# Patient Record
Sex: Male | Born: 1970 | Race: White | Hispanic: No | Marital: Single | Smoking: Never smoker
Health system: Southern US, Community
[De-identification: ages and names within clinical notes are randomized; demographics above are authoritative.]

## PROBLEM LIST (undated history)

## (undated) ENCOUNTER — Emergency Department (HOSPITAL_COMMUNITY): Payer: Self-pay | Source: Home / Self Care

## (undated) DIAGNOSIS — E669 Obesity, unspecified: Secondary | ICD-10-CM

## (undated) DIAGNOSIS — F419 Anxiety disorder, unspecified: Secondary | ICD-10-CM

## (undated) DIAGNOSIS — F319 Bipolar disorder, unspecified: Secondary | ICD-10-CM

## (undated) DIAGNOSIS — M1990 Unspecified osteoarthritis, unspecified site: Secondary | ICD-10-CM

## (undated) DIAGNOSIS — F4310 Post-traumatic stress disorder, unspecified: Secondary | ICD-10-CM

## (undated) DIAGNOSIS — Z8739 Personal history of other diseases of the musculoskeletal system and connective tissue: Secondary | ICD-10-CM

## (undated) DIAGNOSIS — M797 Fibromyalgia: Secondary | ICD-10-CM

## (undated) HISTORY — PX: GASTRIC BYPASS: SHX52

## (undated) HISTORY — PX: ADENOIDECTOMY: SUR15

## (undated) HISTORY — PX: APPENDECTOMY: SHX54

## (undated) HISTORY — PX: TYMPANOSTOMY TUBE PLACEMENT: SHX32

## (undated) HISTORY — PX: CHOLECYSTECTOMY: SHX55

---

## 2000-07-20 ENCOUNTER — Encounter: Payer: Self-pay | Admitting: *Deleted

## 2000-07-20 ENCOUNTER — Emergency Department (HOSPITAL_COMMUNITY): Admission: EM | Admit: 2000-07-20 | Discharge: 2000-07-20 | Payer: Self-pay | Admitting: *Deleted

## 2004-03-20 ENCOUNTER — Ambulatory Visit (HOSPITAL_COMMUNITY): Admission: RE | Admit: 2004-03-20 | Discharge: 2004-03-20 | Payer: Self-pay | Admitting: Chiropractic Medicine

## 2009-08-31 ENCOUNTER — Emergency Department (HOSPITAL_COMMUNITY): Admission: EM | Admit: 2009-08-31 | Discharge: 2009-08-31 | Payer: Self-pay | Admitting: Emergency Medicine

## 2010-03-19 LAB — DIFFERENTIAL
Basophils Absolute: 0 10*3/uL (ref 0.0–0.1)
Basophils Relative: 0 % (ref 0–1)
Eosinophils Absolute: 0.1 10*3/uL (ref 0.0–0.7)
Eosinophils Relative: 1 % (ref 0–5)
Lymphocytes Relative: 22 % (ref 12–46)
Lymphs Abs: 1.1 K/uL (ref 0.7–4.0)
Monocytes Absolute: 0.4 10*3/uL (ref 0.1–1.0)
Monocytes Relative: 8 % (ref 3–12)
Neutro Abs: 3.3 K/uL (ref 1.7–7.7)
Neutrophils Relative %: 69 % (ref 43–77)

## 2010-03-19 LAB — BASIC METABOLIC PANEL
BUN: 9 mg/dL (ref 6–23)
CO2: 24 mEq/L (ref 19–32)
Calcium: 8.2 mg/dL — ABNORMAL LOW (ref 8.4–10.5)
Chloride: 113 mEq/L — ABNORMAL HIGH (ref 96–112)
Creatinine, Ser: 0.92 mg/dL (ref 0.4–1.5)
GFR calc Af Amer: >60 mL/min (ref >60)
GFR calc non Af Amer: >60 mL/min (ref >60)
Glucose, Bld: 95 mg/dL (ref 70–99)
Potassium: 4 mEq/L (ref 3.5–5.1)
Sodium: 140 mEq/L (ref 135–145)

## 2010-03-19 LAB — CBC
HCT: 39.5 % (ref 39.0–52.0)
Hemoglobin: 13.1 g/dL (ref 13.0–17.0)
MCH: 28.6 pg (ref 26.0–34.0)
MCHC: 33.2 g/dL (ref 30.0–36.0)
MCV: 86.2 fL (ref 78.0–100.0)
Platelets: 140 K/uL — ABNORMAL LOW (ref 150–400)
RBC: 4.58 MIL/uL (ref 4.22–5.81)
RDW: 14.5 % (ref 11.5–15.5)
WBC: 4.9 K/uL (ref 4.0–10.5)

## 2010-03-19 LAB — ETHANOL: Alcohol, Ethyl (B): <5 mg/dL (ref 0–10)

## 2010-10-11 ENCOUNTER — Ambulatory Visit (HOSPITAL_COMMUNITY)
Admission: RE | Admit: 2010-10-11 | Discharge: 2010-10-11 | Disposition: A | Discharge status: Home / Self Care | Payer: Self-pay | Source: Ambulatory Visit | Attending: Family Medicine | Admitting: Family Medicine

## 2010-10-11 DIAGNOSIS — M79609 Pain in unspecified limb: Secondary | ICD-10-CM | POA: Insufficient documentation

## 2010-10-11 DIAGNOSIS — M7989 Other specified soft tissue disorders: Secondary | ICD-10-CM

## 2012-12-31 ENCOUNTER — Encounter (HOSPITAL_COMMUNITY): Payer: Self-pay | Admitting: Emergency Medicine

## 2012-12-31 ENCOUNTER — Emergency Department (HOSPITAL_COMMUNITY)
Admission: EM | Admit: 2012-12-31 | Discharge: 2013-01-01 | Disposition: A | Discharge status: Other Acute Inpatient Hospital | Payer: Self-pay | Attending: Emergency Medicine | Admitting: Emergency Medicine

## 2012-12-31 DIAGNOSIS — E669 Obesity, unspecified: Secondary | ICD-10-CM | POA: Insufficient documentation

## 2012-12-31 DIAGNOSIS — G479 Sleep disorder, unspecified: Secondary | ICD-10-CM | POA: Insufficient documentation

## 2012-12-31 DIAGNOSIS — K089 Disorder of teeth and supporting structures, unspecified: Secondary | ICD-10-CM | POA: Insufficient documentation

## 2012-12-31 DIAGNOSIS — R45851 Suicidal ideations: Secondary | ICD-10-CM | POA: Insufficient documentation

## 2012-12-31 DIAGNOSIS — F314 Bipolar disorder, current episode depressed, severe, without psychotic features: Secondary | ICD-10-CM | POA: Insufficient documentation

## 2012-12-31 DIAGNOSIS — G8929 Other chronic pain: Secondary | ICD-10-CM | POA: Insufficient documentation

## 2012-12-31 DIAGNOSIS — F329 Major depressive disorder, single episode, unspecified: Secondary | ICD-10-CM | POA: Insufficient documentation

## 2012-12-31 DIAGNOSIS — M25569 Pain in unspecified knee: Secondary | ICD-10-CM | POA: Insufficient documentation

## 2012-12-31 HISTORY — DX: Fibromyalgia: M79.7

## 2012-12-31 HISTORY — DX: Bipolar disorder, unspecified: F31.9

## 2012-12-31 HISTORY — DX: Unspecified osteoarthritis, unspecified site: M19.90

## 2012-12-31 HISTORY — DX: Obesity, unspecified: E66.9

## 2012-12-31 LAB — CBC
MCH: 23.6 pg — ABNORMAL LOW (ref 26.0–34.0)
MCHC: 31 g/dL (ref 30.0–36.0)
MCV: 76 fL — ABNORMAL LOW (ref 78.0–100.0)
Platelets: 121 10*3/uL — ABNORMAL LOW (ref 150–400)
RBC: 4.88 MIL/uL (ref 4.22–5.81)
RDW: 16.2 % — ABNORMAL HIGH (ref 11.5–15.5)

## 2012-12-31 LAB — COMPREHENSIVE METABOLIC PANEL
AST: 15 U/L (ref 0–37)
CO2: 20 mEq/L (ref 19–32)
Calcium: 7.8 mg/dL — ABNORMAL LOW (ref 8.4–10.5)
Creatinine, Ser: 1.07 mg/dL (ref 0.50–1.35)
GFR calc Af Amer: >90 mL/min (ref >90)
GFR calc non Af Amer: 83 mL/min — ABNORMAL LOW (ref >90)
Total Protein: 7.1 g/dL (ref 6.0–8.3)

## 2012-12-31 LAB — RAPID URINE DRUG SCREEN, HOSP PERFORMED
Amphetamines: NOT DETECTED
Cocaine: NOT DETECTED
Opiates: NOT DETECTED

## 2012-12-31 LAB — SALICYLATE LEVEL: Salicylate Lvl: <2 mg/dL — ABNORMAL LOW (ref 2.8–20.0)

## 2012-12-31 MED ORDER — NICOTINE 21 MG/24HR TD PT24: 21.0000 mg | MEDICATED_PATCH | Freq: Every day | TRANSDERMAL | Status: DC

## 2012-12-31 MED ORDER — LORAZEPAM 1 MG PO TABS: 1.0000 mg | ORAL_TABLET | Freq: Three times a day (TID) | ORAL | Status: DC | PRN

## 2012-12-31 MED ORDER — ACETAMINOPHEN 325 MG PO TABS: 650.0000 mg | ORAL_TABLET | ORAL | Status: DC | PRN

## 2012-12-31 MED ORDER — IBUPROFEN 200 MG PO TABS: 600.0000 mg | ORAL_TABLET | Freq: Three times a day (TID) | ORAL | Status: DC | PRN

## 2012-12-31 MED ORDER — ALUM & MAG HYDROXIDE-SIMETH 200-200-20 MG/5ML PO SUSP: 30.0000 mL | ORAL | Status: DC | PRN

## 2012-12-31 MED ORDER — ONDANSETRON HCL 4 MG PO TABS: 4.0000 mg | ORAL_TABLET | Freq: Three times a day (TID) | ORAL | Status: DC | PRN

## 2012-12-31 MED ORDER — ZOLPIDEM TARTRATE 5 MG PO TABS: 5.0000 mg | ORAL_TABLET | Freq: Every evening | ORAL | Status: DC | PRN

## 2012-12-31 NOTE — ED Notes (Signed)
Patient has earplugs at the desk in biohazrd bag with mrn sticker on it

## 2012-12-31 NOTE — ED Notes (Signed)
Pt's therapist:  Luana Shu w/ [facility] of the [place] [phone] ex. [phone]  Pt's girlfriend: Julious Oka [phone].

## 2012-12-31 NOTE — ED Provider Notes (Signed)
CSN: [id number]     Arrival date & time 12/31/12  1634 History  This chart was scribed for non-physician practitioner, Antony Madura, PA-C working with Nelia Shi, MD by Greggory Stallion, ED scribe. This patient was seen in room [facility] and the patient's care was started at 8:08 PM.   Chief Complaint  Patient presents with  . Medical Clearance  . Suicidal   The history is provided by the patient. No language interpreter was used.   HPI Comments: Zachary [name] is a 42 y.o. male with history of bipolar disorder who presents to the Emergency Department for medical clearance. He states his therapist, Luana Shu at [facility] of the [place], has been trying to get him to be seen here. Pt is having suicidal ideations. Pt states he has several plans in mind. The last time he tried to commit suicide was this morning when he tried to cut his right wrist. Pt states he has had dental pain and taken oxycodone and several ibuprofen to get the pain to stop with no relief. He states he would also jump in front of a moving car. Denies HI. Pt is not on medications for his bipolar disorder and states he used to be but none of them were working. He states he goes 3-4 days without sleep sometimes. Pt has chronic knee pain and is complaining of knee pain today. Denies alcohol or illicit drug use.   Past Medical History  Diagnosis Date  . Bipolar 1 disorder, depressed   . Arthritis   . Obesity   . Fibromyalgia    Past Surgical History  Procedure Laterality Date  . Gastric bypass    . Cholecystectomy    . Appendectomy    . Tympanostomy tube placement    . Adenoidectomy     History reviewed. No pertinent family history. History  Substance Use Topics  . Smoking status: Never Smoker   . Smokeless tobacco: Never Used  . Alcohol Use: No     Comment: rarely    Review of Systems  HENT: Positive for dental problem.   Musculoskeletal: Positive for arthralgias.  Skin: Positive for wound.   Psychiatric/Behavioral: Positive for suicidal ideas, sleep disturbance, self-injury and dysphoric mood.  All other systems reviewed and are negative.    Allergies  Codeine  Home Medications   No current outpatient prescriptions on file. BP 132/57  Pulse 88  Temp(Src) 98 F (36.7 C) (Oral)  Resp 16  SpO2 100%  Physical Exam  Nursing note and vitals reviewed. Constitutional: He is oriented to person, place, and time. He appears well-developed and well-nourished. No distress.  HENT:  Head: Normocephalic and atraumatic.  Mouth/Throat: Oropharynx is clear and moist.  Eyes: Conjunctivae and EOM are normal. No scleral icterus.  Neck: Normal range of motion.  Cardiovascular: Normal rate, regular rhythm and normal heart sounds.   No murmur heard. Pulmonary/Chest: Effort normal and breath sounds normal. No respiratory distress. He has no wheezes. He has no rales.  Musculoskeletal: Normal range of motion.  Neurological: He is alert and oriented to person, place, and time.  Skin: Skin is warm and dry. No rash noted. He is not diaphoretic. No erythema. No pallor.  Psychiatric: His speech is normal. His mood appears not anxious. He is withdrawn. Cognition and memory are normal. He exhibits a depressed mood. He expresses suicidal ideation. He expresses no homicidal ideation. He expresses suicidal plans. He expresses no homicidal plans.    ED Course  Procedures (including critical  care time)  DIAGNOSTIC STUDIES: Oxygen Saturation is 100% on RA, normal by my interpretation.    COORDINATION OF CARE: 8:16 PM-Discussed treatment plan which includes ACT consult with pt at bedside and pt agreed to plan.   Labs Review Labs Reviewed  CBC - Abnormal; Notable for the following:    Hemoglobin 11.5 (*)    HCT 37.1 (*)    MCV 76.0 (*)    MCH 23.6 (*)    RDW 16.2 (*)    Platelets 121 (*)    All other components within normal limits  COMPREHENSIVE METABOLIC PANEL - Abnormal; Notable for the  following:    Calcium 7.8 (*)    Alkaline Phosphatase 202 (*)    GFR calc non Af Amer 83 (*)    All other components within normal limits  SALICYLATE LEVEL - Abnormal; Notable for the following:    Salicylate Lvl <2.0 (*)    All other components within normal limits  ACETAMINOPHEN LEVEL  ETHANOL  URINE RAPID DRUG SCREEN (HOSP PERFORMED)   Imaging Review No results found.  EKG Interpretation   None       MDM   1. Suicidal ideations   2. MDD (major depressive disorder)   3. Bipolar I disorder, most recent episode (or current) depressed, severe, without mention of psychotic behavior    Patient presents for depression with suicidal ideations. Patient versus trying to slit his wrists this morning in an attempt to kill himself. He is unable to contract for safety. Physical exam unremarkable. Patient medically cleared for TTS eval.  Patient evaluated by behavioral health team. Patient accepted at [facility] for further treatment and management of his depression and suicidal ideations.  I personally performed the services described in this documentation, which was scribed in my presence. The recorded information has been reviewed and is accurate.    Antony Madura, PA-C 01/06/13 2141

## 2012-12-31 NOTE — BH Assessment (Signed)
BHH Assessment Progress Note      Attempted to contact PA Antony Madura for clinical information prior to assessment. Spoke with PA, Tresa Endo, who reports she is a Catering manager.  Spoke with ED secretary, Eunice Blase, who has no other [facility] listed for this evening.  Will proceed with clinical information from Epic.

## 2012-12-31 NOTE — ED Notes (Signed)
Pt c/o increased depression.  Sts "I've been talking to a therapist at [facility] of the [place] and they told me to come here.  I've been battling depression and bipolar disorder for years and I've attempted suicide multiple times."  Sts SI w/ multiple plans, including "cutting wrists, overdosing, and jumping into traffic."  Pt sts increased pain from medical conditions and stress from holidays, friends, and family.

## 2012-12-31 NOTE — ED Notes (Addendum)
Pt arrived to unit; no s/s of distress noted. Pt states he has been suicidal for a couple of weeks. Pt states he has taken more of his medicine than he was supposed to in an attempt to die. Pt states he has multiple stressors including being bipolar and unemployed. Pt lives with his girlfriend and has support at home. Pt also states that he has multiple health issues including falling frequently. He reports his last fall was two days ago. Pt states he cannot ambulate safely without a cane. Pt verbally contracts for safety.

## 2013-01-01 ENCOUNTER — Inpatient Hospital Stay (HOSPITAL_COMMUNITY)
Admission: EM | Admit: 2013-01-01 | Discharge: 2013-01-07 | DRG: 885 | Disposition: A | Discharge status: Home / Self Care | Payer: Federal, State, Local not specified - Other | Source: Ambulatory Visit | Attending: Psychiatry | Admitting: Psychiatry

## 2013-01-01 ENCOUNTER — Encounter (HOSPITAL_COMMUNITY): Payer: Self-pay

## 2013-01-01 DIAGNOSIS — F314 Bipolar disorder, current episode depressed, severe, without psychotic features: Principal | ICD-10-CM | POA: Diagnosis present

## 2013-01-01 DIAGNOSIS — R45851 Suicidal ideations: Secondary | ICD-10-CM

## 2013-01-01 DIAGNOSIS — F411 Generalized anxiety disorder: Secondary | ICD-10-CM | POA: Diagnosis present

## 2013-01-01 DIAGNOSIS — F4310 Post-traumatic stress disorder, unspecified: Secondary | ICD-10-CM | POA: Diagnosis present

## 2013-01-01 DIAGNOSIS — R269 Unspecified abnormalities of gait and mobility: Secondary | ICD-10-CM | POA: Diagnosis present

## 2013-01-01 DIAGNOSIS — F329 Major depressive disorder, single episode, unspecified: Secondary | ICD-10-CM | POA: Diagnosis present

## 2013-01-01 DIAGNOSIS — Z79899 Other long term (current) drug therapy: Secondary | ICD-10-CM

## 2013-01-01 DIAGNOSIS — IMO0001 Reserved for inherently not codable concepts without codable children: Secondary | ICD-10-CM | POA: Diagnosis present

## 2013-01-01 MED ORDER — METHOCARBAMOL 500 MG PO TABS
500.0000 mg | ORAL_TABLET | Freq: Three times a day (TID) | ORAL | Status: DC | PRN
  Administered 2013-01-02 – 2013-01-05 (×5): 500 mg via ORAL
  Filled 2013-01-01 (×5): qty 1

## 2013-01-01 MED ORDER — LAMOTRIGINE 25 MG PO TABS
25.0000 mg | ORAL_TABLET | Freq: Two times a day (BID) | ORAL | Status: DC
  Administered 2013-01-01 – 2013-01-04 (×7): 25 mg via ORAL
  Filled 2013-01-01 (×10): qty 1

## 2013-01-01 MED ORDER — TRAZODONE HCL 50 MG PO TABS
50.0000 mg | ORAL_TABLET | Freq: Every evening | ORAL | Status: DC | PRN
  Administered 2013-01-01: 50 mg via ORAL
  Filled 2013-01-01 (×4): qty 1

## 2013-01-01 MED ORDER — TRAZODONE HCL 100 MG PO TABS
100.0000 mg | ORAL_TABLET | Freq: Every evening | ORAL | Status: DC | PRN
  Administered 2013-01-01: 100 mg via ORAL
  Filled 2013-01-01 (×6): qty 1

## 2013-01-01 MED ORDER — ACETAMINOPHEN 325 MG PO TABS
650.0000 mg | ORAL_TABLET | Freq: Four times a day (QID) | ORAL | Status: DC | PRN
  Administered 2013-01-01: 650 mg via ORAL
  Filled 2013-01-01: qty 2

## 2013-01-01 MED ORDER — HYDROXYZINE HCL 25 MG PO TABS
25.0000 mg | ORAL_TABLET | Freq: Four times a day (QID) | ORAL | Status: DC | PRN
  Administered 2013-01-04: 25 mg via ORAL
  Filled 2013-01-01: qty 1

## 2013-01-01 MED ORDER — ALUM & MAG HYDROXIDE-SIMETH 200-200-20 MG/5ML PO SUSP: 30.0000 mL | ORAL | Status: DC | PRN

## 2013-01-01 MED ORDER — MAGNESIUM HYDROXIDE 400 MG/5ML PO SUSP: 30.0000 mL | Freq: Every day | ORAL | Status: DC | PRN

## 2013-01-01 MED ORDER — GABAPENTIN 100 MG PO CAPS
200.0000 mg | ORAL_CAPSULE | Freq: Three times a day (TID) | ORAL | Status: DC
  Administered 2013-01-01 – 2013-01-04 (×10): 200 mg via ORAL
  Filled 2013-01-01 (×14): qty 2

## 2013-01-01 MED ORDER — DULOXETINE HCL 20 MG PO CPEP
20.0000 mg | ORAL_CAPSULE | Freq: Every day | ORAL | Status: DC
  Administered 2013-01-01 – 2013-01-04 (×4): 20 mg via ORAL
  Filled 2013-01-01 (×6): qty 1

## 2013-01-01 NOTE — Progress Notes (Signed)
D:  Patient's self inventory sheet, patient has poor sleep, poor appetite, low energy level, poor attention span.  Rated depression and hopeless #6, anxiety #8.  Denied withdrawals.  SI off/on, and then said SI constant, contracts for safety.  Stated he has bad knees, lightheaded, dizziness.  Denied using alcohol and drugs.  Plans to return home after discharge.  Needs financial assistance for medications. A:  Medications administered per MD orders.  Emotional support and encouragement given patient. R:  Denied HI.  Denied A/V hallucinations.  SI off/on, contracts for safety.  Will continue to monitor patient for safety with 15 minute checks.  Safety maintained.

## 2013-01-01 NOTE — Progress Notes (Signed)
I informed ED MD (Dr Lynelle Doctor) of pt's acceptance to [facility].

## 2013-01-01 NOTE — BHH Group Notes (Signed)
BHH LCSW Group Therapy  01/01/2013 1:15 PM   Type of Therapy:  Group Therapy  Participation Level:  Did Not Attend - pt was sleeping in his room  Reyes Ivan, LCSW 01/01/2013 2:37 PM

## 2013-01-01 NOTE — Progress Notes (Signed)
The focus of this group is to educate the patient on the purpose and policies of crisis stabilization and provide a format to answer questions about their admission.  The group details unit policies and expectations of patients while admitted.  Patient attended 0900 nurse education orientation group this morning.  Patient listened appropriate, alert, appropriate insight and engagement.  Today patient will work on goals for discharge.
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
## 2013-01-01 NOTE — Progress Notes (Signed)
Nutrition Brief Note  Patient identified on the Malnutrition Screening Tool (MST) Report.  Wt Readings from Last 10 Encounters:  01/01/13 378 lb (171.46 kg)   Body mass index is 61.04 kg/(m^2). Patient meets criteria for class III extreme obesity based on current BMI.   Discussed intake PTA with patient and compared to intake presently.  Discussed changes in intake and encouraged adequate intake of meals and snacks. Pt reports history of gastric bypass, 8 years ago. Reports trying to follow high protein diet at home to promote weight loss however reports his weight has been relatively stable recently due to eating excess pasta and starches r/t limited resources. Denies being on any protein shakes at home or needing any during admission. Reports eating well during admission. Denies any nutrition education needs at this time.   Current diet order is regular and pt is also offered choice of unit snacks mid-morning and mid-afternoon.  Pt is eating as desired.   Labs and medications reviewed. Alk phos elevated.     No additional nutrition interventions warranted at this time. If nutrition issues arise, please consult RD.   Recommend social work consult to help pt with limited financial resources.   Levon Hedger MS, RD, LDN [phone] Pager [phone] After Hours Pager

## 2013-01-01 NOTE — Progress Notes (Signed)
Patient ID: Zachary [name], male   DOB: Sep 10, 1970, 42 y.o.   MRN: [id number]  Admission Note:  D:42 yr male who presents [facility] in no acute distress for the treatment of SI and depression. Pt appears flat and depressed. Pt was calm and cooperative with admission process. Pt presents with passive SI and contracts for safety upon admission. Pt denies AVH . Pt is high fall risk, pt knee gives out from time to time and pt uses a cane to ambulate at home. Pt said the stress of the holidays and being un-employed just made his situation worse.  A:Pt says he has been denied disability. Skin was assessed and found to be clear of any abnormal marks apart from a scar on LLleg,RLleg, superficial cuts on wrists, scar on abdomen, and tattoo on L-arm. POC and unit policies explained and understanding verbalized. Consents obtained. Food and fluids offered.   R:Pt had no additional questions or concerns.

## 2013-01-01 NOTE — Progress Notes (Signed)
Pt observed sitting in the dayroom watching TV and having minimal interaction with peers.  Pt reports he is frustrated about being here and understood that he signed himself in and would be able to sign himself out if he felt it was not beneficial for him to be here.  He feels he is trapped here.  Pt is asking about the 72 hr request for discharge form.  Pt was informed of the document and how it works.  Pt understands that it is dated from the moment he signs it, not from when he was at the ED.  At this time, pt chooses not to sign the form.  Spent time talking with pt about his frustrations.  Pt states he is not suicidal at this time, but that the thoughts often come and go.  Pt voices not other needs or concerns.  He denies HI/AV.  Support and encouragement offered.  Safety maintained with q15 minute checks.
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
## 2013-01-01 NOTE — B[facility] Suicide Risk Assessment (Signed)
[facility] INPATIENT:  Family/Significant Other Suicide Prevention Education  Suicide Prevention Education:  Education Completed; Zachary [name] - girlfriend [phone]),  (name of family member/significant other) has been identified by the patient as the family member/significant other with whom the patient will be residing, and identified as the person(s) who will aid the patient in the event of a mental health crisis (suicidal ideations/suicide attempt).  With written consent from the patient, the family member/significant other has been provided the following suicide prevention education, prior to the and/or following the discharge of the patient.  The suicide prevention education provided includes the following:  Suicide risk factors  Suicide prevention and interventions  National Suicide Hotline telephone number  [facility] assessment telephone number  [facility] Emergency Assistance 911  [facility] and/or Residential Mobile Crisis Unit telephone number  Request made of family/significant other to:  Remove weapons (e.g., guns, rifles, knives), all items previously/currently identified as safety concern.    Remove drugs/medications (over-the-counter, prescriptions, illicit drugs), all items previously/currently identified as a safety concern.  The family member/significant other verbalizes understanding of the suicide prevention education information provided.  The family member/significant other agrees to remove the items of safety concern listed above.  Zachary [name] 01/01/2013, 12:54 PM

## 2013-01-01 NOTE — Progress Notes (Signed)
Adult Psychoeducational Group Note  Date:  01/01/2013 Time:  9:57 PM  Group Topic/Focus:  Wrap-Up Group:   The focus of this group is to help patients review their daily goal of treatment and discuss progress on daily workbooks.  Participation Level:  Active  Participation Quality:  Appropriate  Affect:  Appropriate  Cognitive:  Alert and Oriented  Insight: Appropriate  Engagement in Group:  Developing/Improving  Modes of Intervention:  Exploration and Support  Additional Comments:  Patient stated that recovery for him means to focus on himself and not on others. Patient stated that he can start to do that by focusing on the good things. Patient stated that one positive is that he was able to get some clothes. Patient stated that tomorrow he wants to work on his attitude.  Braydee Shimkus, Randal Buba 01/01/2013, 9:57 PM

## 2013-01-01 NOTE — Progress Notes (Signed)
Adult Psychoeducational Group Note  Date:  01/01/2013 Time:  11:00am Group Topic/Focus:  Recovery Goals:   The focus of this group is to identify appropriate goals for recovery and establish a plan to achieve them.  Participation Level:  Active  Participation Quality:  Appropriate and Attentive  Affect:  Appropriate  Cognitive:  Alert and Appropriate  Insight: Appropriate  Engagement in Group:  Engaged  Modes of Intervention:  Discussion and Education  Additional Comments: Pt attended and participated in group. Discussion was on recovery and what it means to you. Pt stated recovery means feeling better than before.    Shelly Bombard D 01/01/2013, 2:03 PM

## 2013-01-01 NOTE — Tx Team (Signed)
Initial Interdisciplinary Treatment Plan  PATIENT STRENGTHS: (choose at least two) Active sense of humor General fund of knowledge  PATIENT STRESSORS: Medication change or noncompliance   PROBLEM LIST: Problem List/Patient Goals Date to be addressed Date deferred Reason deferred Estimated date of resolution  Risk for suicide 01/01/13     Depression 01/01/13                                                DISCHARGE CRITERIA:  Adequate post-discharge living arrangements Medical problems require only outpatient monitoring   PRELIMINARY DISCHARGE PLAN: Attend aftercare/continuing care group Attend PHP/IOP  PATIENT/FAMIILY INVOLVEMENT: This treatment plan has been presented to and reviewed with the patient, Zachary [name].  The patient and family have been given the opportunity to ask questions and make suggestions.  Jacques Navy A 01/01/2013, 3:36 AM

## 2013-01-02 MED ORDER — IBUPROFEN 600 MG PO TABS
600.0000 mg | ORAL_TABLET | Freq: Four times a day (QID) | ORAL | Status: DC | PRN
  Administered 2013-01-02 – 2013-01-06 (×6): 600 mg via ORAL
  Filled 2013-01-02 (×6): qty 1

## 2013-01-02 MED ORDER — TRAZODONE HCL 150 MG PO TABS
150.0000 mg | ORAL_TABLET | Freq: Every evening | ORAL | Status: DC | PRN
  Administered 2013-01-02 – 2013-01-03 (×2): 150 mg via ORAL
  Filled 2013-01-02 (×6): qty 1

## 2013-01-02 NOTE — [facility] Group Notes (Signed)
[facility] LCSW Aftercare Discharge Planning Group Note   01/02/2013    Participation Quality:  Appropraite  Mood/Affect:  Appropriate  Depression Rating:  5  Anxiety Rating:  7  Thoughts of Suicide:  No  Will you contract for safety?   NA  Current AVH:  No  Plan for Discharge/Comments:  Patient attended discharge planning group and actively participated in group. He advised of having outpatient services through [facility].  CSW provided all participants with daily workbook.   Transportation Means: Patient has transportation.   Supports:  Patient has a support system.   Julia Alkhatib, Joesph July

## 2013-01-02 NOTE — Progress Notes (Signed)
D: Patient appropriate and cooperative with staff and peers. He reported on the self inventory sheet that he's sleeping poorly, appetite and ability to pay attention are improving and energy level is low. Patient rated depression "4" and feelings of hopelessness "5". He's attending groups and interactive with peers in the milieu. Compliant with medications.  A: Support and encouragement provided to patient. Scheduled medications administered per MD orders. Maintain Q15 minute checks for safety.  R: Patient receptive. Passive SI, but contracts for safety. Denies HI and auditory/visual hallucinations. Patient remains safe.

## 2013-01-02 NOTE — Progress Notes (Signed)
Adult Psychoeducational Group Note  Date:  01/02/2013 Time:  11:00am Group Topic/Focus:  Personal Choices and Values:   The focus of this group is to help patients assess and explore the importance of values in their lives, how their values affect their decisions, how they express their values and what opposes their expression.  Participation Level:  Active  Participation Quality:  Appropriate and Attentive  Affect:  Appropriate  Cognitive:  Alert and Appropriate  Insight: Appropriate  Engagement in Group:  Engaged  Modes of Intervention:  Discussion and Education  Additional Comments:  Pt attended and participated in group. Discussion was on New Year's Resolution and the positive change you want to make in your personal development. Pt stated his goal is to not let things overwhelm him and start taking small steps.  Shelly Bombard D 01/02/2013, 2:25 PM

## 2013-01-02 NOTE — B[facility] Group Notes (Signed)
Adult Psychoeducational Group Note  Date:  01/02/2013 Time:  9:24 PM  Group Topic/Focus:  Wrap-Up Group:   The focus of this group is to help patients review their daily goal of treatment and discuss progress on daily workbooks.  Participation Level:  Active  Participation Quality:  Appropriate  Affect:  Appropriate  Cognitive:  Appropriate  Insight: Appropriate  Engagement in Group:  Supportive  Modes of Intervention:  Discussion  Additional Comments:  Patient stated that is goal was to help people and being a better person overall. He was very supportive of other patients during group offering words of encouragement.  Delia Chimes 01/02/2013, 9:24 PM

## 2013-01-02 NOTE — Tx Team (Signed)
Interdisciplinary Treatment Plan Update   Date Reviewed:  01/02/2013  Time Reviewed:  10:03 AM  Progress in Treatment:   Attending groups: Yes Participating in groups: Yes Taking medication as prescribed: Yes  Tolerating medication: Yes Family/Significant other contact made: Yes, contact made with family Patient understands diagnosis: Yes  Discussing patient identified problems/goals with staff: Yes Medical problems stabilized or resolved: Yes Denies suicidal/homicidal ideation: Yes Patient has not harmed self or others: Yes  For review of initial/current patient goals, please see plan of care.  Estimated Length of Stay:  2-3 days  Reasons for Continued Hospitalization:  Anxiety Depression Medication stabilization Suicidal ideation   New Problems/Goals identified:    Discharge Plan or Barriers:   Home with outpatient follow up  Additional Comments: N/A  Attendees:  Patient:  01/02/2013 10:03 AM   Signature: Silverio Decamp, NP  01/02/2013 10:03 AM  Signature: 01/02/2013 10:03 AM  Signature: Harold Barban, RN 01/02/2013 10:03 AM  Signature: 01/02/2013 10:03 AM  Signature:   01/02/2013 10:03 AM  Signature:  Horace Porteous Geraldean Walen, LCSW 01/02/2013 10:03 AM  Signature:  Reyes Ivan, LCSW 01/02/2013 10:03 AM  Signature:   01/02/2013 10:03 AM  Signature:   01/02/2013 10:03 AM  Signature: Leighton Parody, RN 01/02/2013  10:03 AM  Signature:   01/02/2013  10:03 AM  Signature:  Elizbeth Squires, Team Lead [place] 01/02/2013  10:03 AM    Scribe for Treatment Team:   Juline Patch,  01/02/2013 10:03 AM

## 2013-01-02 NOTE — Progress Notes (Signed)
[facility] MD Progress Note  01/02/2013 2:15 PM Zachary [name]  MRN:  [id number] Subjective:   Pt reports that he is not necessarily better today; about the same. Pt c/o severe insomnia and requests a change in medication dosage to alleviate this; partially exacerbated by snoring roommate here inpatient. Appetite is good. Denies, HI, Psychosis, but affirms SI with intermittent thoughts of "taking pills" and a hx of thinking of "ODing on pills at home". States that he just doesn't "know if he can handle it when he gets out of here". Rates depression at 8/10 and anxiety at 7/10. When asked about PRN medications for pain and anxiety, pt stated that he wants to "tough it out," but was strongly encouraged to utilize PRN's so as to avoid persistent pain and anxiety which could worsen his mental state. He expressed a lot of anxiety and concern about the group therapy stating that talking in front of the group makes him very anxious and it is very upsetting. He continues to affirm a feeling of "panic about discharging from here". Pt c/o headaches, joint pain (knees, back, etc.) and was reminded that he is not utilizing PRN's and must do so for relief. His main stated stressors at time of assessment are family troubles and financial concerns.   Diagnosis:   DSM5: Depressive Disorders:  Major Depressive Disorder - Severe (296.23), Bipolar 1  Axis I: Anxiety Disorder NOS, Bipolar, Depressed and Major Depression, Recurrent severe Axis II: Deferred Axis III:  Past Medical History  Diagnosis Date  . Bipolar 1 disorder, depressed   . Arthritis   . Obesity   . Fibromyalgia    Axis IV: other psychosocial or environmental problems and problems related to social environment Axis V: 11-20 some danger of hurting self or others possible OR occasionally fails to maintain minimal personal hygiene OR gross impairment in communication  ADL's:  Intact  Sleep: Fair  Appetite:  Good  Suicidal Ideation:  Plan:  Taking  Pills Intent:  Pt states he does not intend to, but thinks about it Means:  OD on medications at home, thoughts of this throughout day Homicidal Ideation:  Plan:  Denies Intent:  Denies Means:  Denies AEB (as evidenced by):  Psychiatric Specialty Exam: Review of Systems  Constitutional: Negative.   Eyes: Negative.   Respiratory: Negative.   Cardiovascular: Negative.   Gastrointestinal: Negative.   Genitourinary: Negative.   Musculoskeletal: Positive for back pain, joint pain (knees, chronic) and myalgias.  Skin: Negative.   Neurological: Positive for headaches.  Endo/Heme/Allergies: Negative.   Psychiatric/Behavioral: Positive for depression and suicidal ideas. Negative for hallucinations and substance abuse. The patient is nervous/anxious and has insomnia (pt states this is ongoing but exacerbated by roommate's snoring at [facility]).     Blood pressure 127/76, pulse 98, temperature 97.3 F (36.3 C), temperature source Oral, resp. rate 18, height 5\' 6"  (1.676 m), weight 171.46 kg (378 lb).Body mass index is 61.04 kg/(m^2).  General Appearance: Disheveled  Eye Solicitor::  Fair  Speech:  Normal Rate  Volume:  Decreased  Mood:  Anxious and Depressed  Affect:  Congruent  Thought Process:  Coherent  Orientation:  Full (Time, Place, and Person)  Thought Content:  WDL  Suicidal Thoughts:  Yes.  with intent/plan  Homicidal Thoughts:  No  Memory:  Immediate;   Fair Recent;   Fair Remote;   Fair  Judgement:  Fair  Insight:  Fair  Psychomotor Activity:  Decreased  Concentration:  Fair  Recall:  Fair  Akathisia:  No  Handed:  Right  AIMS (if indicated):     Assets:  Communication Skills Resilience  Sleep:  Number of Hours: 3     Current Medications: Current Facility-Administered Medications  Medication Dose Route Frequency Provider Last Rate Last Dose  . acetaminophen (TYLENOL) tablet 650 mg  650 mg Oral Q6H PRN Kerry Hough, PA-C   650 mg at 01/01/13 0240  . alum & mag  hydroxide-simeth (MAALOX/MYLANTA) 200-200-20 MG/5ML suspension 30 mL  30 mL Oral Q4H PRN Kerry Hough, PA-C      . DULoxetine (CYMBALTA) DR capsule 20 mg  20 mg Oral Daily Nanine Means, NP   20 mg at 01/02/13 0817  . gabapentin (NEURONTIN) capsule 200 mg  200 mg Oral TID Nanine Means, NP   200 mg at 01/02/13 1139  . hydrOXYzine (ATARAX/VISTARIL) tablet 25 mg  25 mg Oral Q6H PRN Nanine Means, NP      . lamoTRIgine (LAMICTAL) tablet 25 mg  25 mg Oral BID Nanine Means, NP   25 mg at 01/02/13 0817  . magnesium hydroxide (MILK OF MAGNESIA) suspension 30 mL  30 mL Oral Daily PRN Kerry Hough, PA-C      . methocarbamol (ROBAXIN) tablet 500 mg  500 mg Oral Q8H PRN Nanine Means, NP      . traZODone (DESYREL) tablet 100 mg  100 mg Oral QHS,MR X 1 Nanine Means, NP   100 mg at 01/01/13 2143    Lab Results:  Results for orders placed during the hospital encounter of 12/31/12 (from the past 48 hour(s))  ACETAMINOPHEN LEVEL     Status: None   Collection Time    12/31/12  6:10 PM      Result Value Range   Acetaminophen (Tylenol), Serum <15.0  10 - 30 ug/mL   Comment:            THERAPEUTIC CONCENTRATIONS VARY     SIGNIFICANTLY. A RANGE OF 10-30     ug/mL MAY BE AN EFFECTIVE     CONCENTRATION FOR MANY PATIENTS.     HOWEVER, SOME ARE BEST TREATED     AT CONCENTRATIONS OUTSIDE THIS     RANGE.     ACETAMINOPHEN CONCENTRATIONS     >150 ug/mL AT 4 HOURS AFTER     INGESTION AND >50 ug/mL AT 12     HOURS AFTER INGESTION ARE     OFTEN ASSOCIATED WITH TOXIC     REACTIONS.  CBC     Status: Abnormal   Collection Time    12/31/12  6:10 PM      Result Value Range   WBC 4.8  4.0 - 10.5 K/uL   RBC 4.88  4.22 - 5.81 MIL/uL   Hemoglobin 11.5 (*) 13.0 - 17.0 g/dL   HCT 40.9 (*) 81.1 - 91.4 %   MCV 76.0 (*) 78.0 - 100.0 fL   MCH 23.6 (*) 26.0 - 34.0 pg   MCHC 31.0  30.0 - 36.0 g/dL   RDW 78.2 (*) 95.6 - 21.3 %   Platelets 121 (*) 150 - 400 K/uL  COMPREHENSIVE METABOLIC PANEL     Status: Abnormal    Collection Time    12/31/12  6:10 PM      Result Value Range   Sodium 138  135 - 145 mEq/L   Potassium 3.9  3.5 - 5.1 mEq/L   Chloride 107  96 - 112 mEq/L   CO2 20  19 - 32 mEq/L  Glucose, Bld 77  70 - 99 mg/dL   BUN 12  6 - 23 mg/dL   Creatinine, Ser 4.54  0.50 - 1.35 mg/dL   Calcium 7.8 (*) 8.4 - 10.5 mg/dL   Total Protein 7.1  6.0 - 8.3 g/dL   Albumin 3.6  3.5 - 5.2 g/dL   AST 15  0 - 37 U/L   ALT 13  0 - 53 U/L   Alkaline Phosphatase 202 (*) 39 - 117 U/L   Total Bilirubin 0.3  0.3 - 1.2 mg/dL   GFR calc non Af Amer 83 (*) >90 mL/min   GFR calc Af Amer >90  >90 mL/min   Comment: (NOTE)     The eGFR has been calculated using the CKD EPI equation.     This calculation has not been validated in all clinical situations.     eGFR's persistently <90 mL/min signify possible Chronic Kidney     Disease.  ETHANOL     Status: None   Collection Time    12/31/12  6:10 PM      Result Value Range   Alcohol, Ethyl (B) <11  0 - 11 mg/dL   Comment:            LOWEST DETECTABLE LIMIT FOR     SERUM ALCOHOL IS 11 mg/dL     FOR MEDICAL PURPOSES ONLY  SALICYLATE LEVEL     Status: Abnormal   Collection Time    12/31/12  6:10 PM      Result Value Range   Salicylate Lvl <2.0 (*) 2.8 - 20.0 mg/dL  URINE RAPID DRUG SCREEN (HOSP PERFORMED)     Status: None   Collection Time    12/31/12  6:54 PM      Result Value Range   Opiates NONE DETECTED  NONE DETECTED   Cocaine NONE DETECTED  NONE DETECTED   Benzodiazepines NONE DETECTED  NONE DETECTED   Amphetamines NONE DETECTED  NONE DETECTED   Tetrahydrocannabinol NONE DETECTED  NONE DETECTED   Barbiturates NONE DETECTED  NONE DETECTED   Comment:            DRUG SCREEN FOR MEDICAL PURPOSES     ONLY.  IF CONFIRMATION IS NEEDED     FOR ANY PURPOSE, NOTIFY LAB     WITHIN 5 DAYS.                LOWEST DETECTABLE LIMITS     FOR URINE DRUG SCREEN     Drug Class       Cutoff (ng/mL)     Amphetamine      1000     Barbiturate      200      Benzodiazepine   200     Tricyclics       300     Opiates          300     Cocaine          300     THC              50    Physical Findings: AIMS: Facial and Oral Movements Muscles of Facial Expression: None, normal Lips and Perioral Area: None, normal Jaw: None, normal Tongue: None, normal,Extremity Movements Upper (arms, wrists, hands, fingers): None, normal Lower (legs, knees, ankles, toes): Minimal, Trunk Movements Neck, shoulders, hips: None, normal, Overall Severity Severity of abnormal movements (highest score from questions above): None, normal Incapacitation due to abnormal movements: None, normal  Patient's awareness of abnormal movements (rate only patient's report): No Awareness, Dental Status Current problems with teeth and/or dentures?: No Does patient usually wear dentures?: No  CIWA:  CIWA-Ar Total: 4 COWS:  COWS Total Score: 3  Treatment Plan Summary: Daily contact with patient to assess and evaluate symptoms and progress in treatment Medication management  Plan: Review of chart, vital signs, medications, and notes.  1-Individual and group therapy  2-Medication management for depression and anxiety: Medications reviewed with the patient and he stated no untoward effects aside from very mild tremor. Increase Trazodone from 100mg  qhs to 150mg  qhs for insomnia (persisting), Add motrin PRN for back, knee, and generalized joint pain. 3-Coping skills for depression, anxiety  4-Continue crisis stabilization and management  5-Address health issues--monitoring vital signs, stable  6-Treatment plan in progress to prevent relapse of depression and anxiety   Medical Decision Making Problem Points:  Established problem, worsening (2) and Review of psycho-social stressors (1) Data Points:  Review of medication regiment & side effects (2) Review of new medications or change in dosage (2)  I certify that inpatient services furnished can reasonably be expected to improve  the patient's condition.   Beau Fanny, FNP-BC 01/02/2013, 2:15 PM Agree with assessment and plan Madie Reno A. Dub Mikes, M.D.

## 2013-01-03 DIAGNOSIS — R45851 Suicidal ideations: Secondary | ICD-10-CM

## 2013-01-03 DIAGNOSIS — F3130 Bipolar disorder, current episode depressed, mild or moderate severity, unspecified: Secondary | ICD-10-CM

## 2013-01-03 DIAGNOSIS — F411 Generalized anxiety disorder: Secondary | ICD-10-CM

## 2013-01-03 LAB — CBC WITH DIFFERENTIAL/PLATELET
Basophils Absolute: 0 10*3/uL (ref 0.0–0.1)
Basophils Relative: 0 % (ref 0–1)
Eosinophils Absolute: 0.1 10*3/uL (ref 0.0–0.7)
Eosinophils Relative: 1 % (ref 0–5)
HCT: 34.4 % — ABNORMAL LOW (ref 39.0–52.0)
Hemoglobin: 10.5 g/dL — ABNORMAL LOW (ref 13.0–17.0)
Lymphocytes Relative: 37 % (ref 12–46)
Lymphs Abs: 1.6 10*3/uL (ref 0.7–4.0)
MCH: 23.3 pg — ABNORMAL LOW (ref 26.0–34.0)
MCHC: 30.5 g/dL (ref 30.0–36.0)
MCV: 76.4 fL — ABNORMAL LOW (ref 78.0–100.0)
Monocytes Absolute: 0.3 10*3/uL (ref 0.1–1.0)
Monocytes Relative: 7 % (ref 3–12)
Neutro Abs: 2.4 10*3/uL (ref 1.7–7.7)
Neutrophils Relative %: 55 % (ref 43–77)
Platelets: 127 10*3/uL — ABNORMAL LOW (ref 150–400)
RBC: 4.5 MIL/uL (ref 4.22–5.81)
RDW: 16 % — ABNORMAL HIGH (ref 11.5–15.5)
WBC: 4.4 10*3/uL (ref 4.0–10.5)

## 2013-01-03 NOTE — Progress Notes (Signed)
D: Patient's affect appropriate to circumstance and mood is depressed. He reported on the self inventory sheet that he's sleeping fair, appetite is good, energy level is normal and ability to pay attention is improving. Patient rated depression and feelings of hopelessness "2-3". He's participating in groups and tolerating medications well.  A: Support and encouragement provided to patient. Administered scheduled medications per ordering MD. Monitor Q15 minute checks for safety.  R: Patient receptive. Endorses passive SI, but contracts for safety. Denies HI. Patient remains safe on the unit.

## 2013-01-03 NOTE — Progress Notes (Signed)
Pt attended Karaoke group and actively participated by singing a song.

## 2013-01-03 NOTE — Progress Notes (Signed)
D: Patient in the dayroom speaking with peers on approach.  Patient states he was having a good day but states he may not go home soon he stated it ruined his whole day.  Patient states he wants help.  Patient states he is also worried about things at home.  Patient states he needs to get home to handle his billing and inportant things.  Patient denies SI/HI and denies AVH. A: Staff to monitor Q 15 mins for safety.  Encouragement and support offered.  Scheduled medications administered per orders. R: Patient remains safe on the unit.  Patient attended group tonight.  Patient visible on the unit and interacting with peers.  Patient taking administered medications.

## 2013-01-03 NOTE — Progress Notes (Signed)
Pt observed sitting in the dayroom watching TV and interacting with his peers.  Pt reports he is still feeling depressed and hopeless.  He says he does not see much improvement to his situation.  Pt is attending groups and participating even though he says he feels nervous being in groups settings.  He is polite and cooperative with staff and peers.  He is unsure of his discharge plans and says thinking about it makes him nervous although he says he would like to be discharged soon.  He says he has a good support system.  He asks about having more labs drawn as he had some abnormal results at the ED.  Order obtained to draw another CBC in the AM.  Support and encouragement offered.  Pt makes his needs known to staff.  Safety maintained with q15 minute checks.

## 2013-01-03 NOTE — Progress Notes (Signed)
Patient ID: Zachary DodrillJames E [name], male   DOB: Sep 21, 1970, 43 y.o.   MRN: [id number] [facility]  01/03/2013 1:45 PM Zachary DodrillJames E [name]  MRN:  [id number] Subjective:  Patient was seen and chart reviewed. Patient has no complaints today. He has been compliant with his medication without adverse effects. Patient has been participating inpatient hospital program including groups without significant difficulties. Patient has disturbance of sleep and appetite.  Patient stated that he has suicidal ideations with intermittent thoughts of medication overdose. Patient cannot contract for safety at this time. Patient rates depression at  5/10 and anxiety at  6/10. Patient   has anxiety about the group therapy,  stating that talking in front of the group makes him very anxious and it is very upsetting. His stressors at time are family troubles and financial concerns.   Diagnosis:   DSM5: Depressive Disorders:  Major Depressive Disorder - Severe (296.23), Bipolar 1  Axis I: Anxiety Disorder NOS, Bipolar, Depressed and Major Depression, Recurrent severe Axis II: Deferred Axis III:  Past Medical History  Diagnosis Date  . Bipolar 1 disorder, depressed   . Arthritis   . Obesity   . Fibromyalgia    Axis IV: other psychosocial or environmental problems and problems related to social environment Axis V: 11-20 some danger of hurting self or others possible OR occasionally fails to maintain minimal personal hygiene OR gross impairment in communication  ADL's:  Intact  Sleep: Fair  Appetite:  Good  Suicidal Ideation:  Plan:  Taking Pills Intent:  Pt states he does not intend to, but thinks about it Means:  OD on medications at home, thoughts of this throughout day Homicidal Ideation:  Plan:  Denies Intent:  Denies Means:  Denies AEB (as evidenced by):  Psychiatric Specialty Exam: Review of Systems  Constitutional: Negative.   Eyes: Negative.   Respiratory: Negative.   Cardiovascular: Negative.    Gastrointestinal: Negative.   Genitourinary: Negative.   Musculoskeletal: Positive for back pain, joint pain (knees, chronic) and myalgias.  Skin: Negative.   Neurological: Positive for headaches.  Endo/Heme/Allergies: Negative.   Psychiatric/Behavioral: Positive for depression and suicidal ideas. Negative for hallucinations and substance abuse. The patient is nervous/anxious and has insomnia (pt states this is ongoing but [facility]     Blood pressure 135/81, pulse 85, temperature 97.2 F (36.2 C), temperature source Oral, resp. rate 15, height 5\' 6"  (1.676 m), weight 171.46 kg (378 lb).Body mass index is 61.04 kg/(m^2).  General Appearance: Disheveled  Eye SolicitorContact::  Fair  Speech:  Normal Rate  Volume:  Decreased  Mood:  Anxious and Depressed  Affect:  Congruent  Thought Process:  Coherent  Orientation:  Full (Time, Place, and Person)  Thought Content:  WDL  Suicidal Thoughts:  Yes.  with intent/plan  Homicidal Thoughts:  No  Memory:  Immediate;   Fair Recent;   Fair Remote;   Fair  Judgement:  Fair  Insight:  Fair  Psychomotor Activity:  Decreased  Concentration:  Fair  Recall:  Fair  Akathisia:  No  Handed:  Right  AIMS (if indicated):     Assets:  Communication Skills Resilience  Sleep:  Number of Hours: 6.75     Current Medications: Current Facility-Administered Medications  Medication Dose Route Frequency Provider Last Rate Last Dose  . acetaminophen (TYLENOL) tablet 650 mg  650 mg Oral Q6H PRN Kerry HoughSpencer E Simon, PA-C   650 mg at 01/01/13 0240  . alum & mag hydroxide-simeth (  MAALOX/MYLANTA) 200-200-20 MG/5ML suspension 30 mL  30 mL Oral Q4H PRN Kerry Hough, PA-C      . DULoxetine (CYMBALTA) DR capsule 20 mg  20 mg Oral Daily Nanine Means, NP   20 mg at 01/03/13 0758  . gabapentin (NEURONTIN) capsule 200 mg  200 mg Oral TID Nanine Means, NP   200 mg at 01/03/13 1200  . hydrOXYzine (ATARAX/VISTARIL) tablet 25 mg  25 mg Oral Q6H PRN  Nanine Means, NP      . ibuprofen (ADVIL,MOTRIN) tablet 600 mg  600 mg Oral Q6H PRN Beau Fanny, FNP   600 mg at 01/02/13 1556  . lamoTRIgine (LAMICTAL) tablet 25 mg  25 mg Oral BID Nanine Means, NP   25 mg at 01/03/13 0758  . magnesium hydroxide (MILK OF MAGNESIA) suspension 30 mL  30 mL Oral Daily PRN Kerry Hough, PA-C      . methocarbamol (ROBAXIN) tablet 500 mg  500 mg Oral Q8H PRN Nanine Means, NP   500 mg at 01/02/13 1556  . traZODone (DESYREL) tablet 150 mg  150 mg Oral QHS,MR X 1 Beau Fanny, FNP   150 mg at 01/02/13 2207    Lab Results:  Results for orders placed during the hospital encounter of 01/01/13 (from the past 48 hour(s))  CBC WITH DIFFERENTIAL     Status: Abnormal   Collection Time    01/03/13  6:38 AM      Result Value Range   WBC 4.4  4.0 - 10.5 K/uL   RBC 4.50  4.22 - 5.81 MIL/uL   Hemoglobin 10.5 (*) 13.0 - 17.0 g/dL   HCT 16.1 (*) 09.6 - 04.5 %   MCV 76.4 (*) 78.0 - 100.0 fL   MCH 23.3 (*) 26.0 - 34.0 pg   MCHC 30.5  30.0 - 36.0 g/dL   RDW 40.9 (*) 81.1 - 91.4 %   Platelets 127 (*) 150 - 400 K/uL   Neutrophils Relative % 55  43 - 77 %   Neutro Abs 2.4  1.7 - 7.7 K/uL   Lymphocytes Relative 37  12 - 46 %   Lymphs Abs 1.6  0.7 - 4.0 K/uL   Monocytes Relative 7  3 - 12 %   Monocytes Absolute 0.3  0.1 - 1.0 K/uL   Eosinophils Relative 1  0 - 5 %   Eosinophils Absolute 0.1  0.0 - 0.7 K/uL   Basophils Relative 0  0 - 1 %   Basophils Absolute 0.0  0.0 - 0.1 K/uL   Comment: Performed at [facility]    Physical Findings: AIMS: Facial and Oral Movements Muscles of Facial Expression: None, normal Lips and Perioral Area: None, normal Jaw: None, normal Tongue: None, normal,Extremity Movements Upper (arms, wrists, hands, fingers): None, normal Lower (legs, knees, ankles, toes): Minimal, Trunk Movements Neck, shoulders, hips: None, normal, Overall Severity Severity of abnormal movements (highest score from questions above): None,  normal Incapacitation due to abnormal movements: None, normal Patient's awareness of abnormal movements (rate only patient's report): No Awareness, Dental Status Current problems with teeth and/or dentures?: No Does patient usually wear dentures?: No  CIWA:  CIWA-Ar Total: 4 COWS:  COWS Total Score: 3  Treatment Plan Summary: Daily contact with patient to assess and evaluate symptoms and progress in treatment Medication management  Plan: Review of chart, vital signs, medications, and notes.  1-Individual and group therapy  2-Medication management for depression and anxiety: Medications reviewed with the patient and  he stated no untoward effects aside from very mild tremor.  Continue Cymbalta 20 mg daily for depression Continue Neurontin 200 mg as prescribed for mood swings and pain Continue Lamictal 25 mg twice a day for mood swings Continue Trazodone from 150mg  qhs for insomnia Continue Motrin PRN for back, knee, and generalized joint pain. 3-Coping skills for depression, anxiety  4-Continue crisis stabilization and management  5-Address health issues--monitoring vital signs, stable  6-Treatment plan in progress to prevent relapse of depression and anxiety   Medical Decision Making Problem Points:  Established problem, worsening (2) and Review of psycho-social stressors (1) Data Points:  Review of medication regiment & side effects (2) Review of new medications or change in dosage (2)  I certify that inpatient services furnished can reasonably be expected to improve the patient's condition.   Nehemiah Settle.,  M.D.  01/03/2013, 1:45 PM

## 2013-01-03 NOTE — Progress Notes (Signed)
Adult Psychoeducational Group Note  Date:  01/03/2013 Time:  9:15 AM  Group Topic/Focus:  Morning Wellness Group  Participation Level:  Active  Participation Quality:  Appropriate and Attentive  Affect:  Appropriate  Cognitive:  Alert and Oriented  Insight: Appropriate  Engagement in Group:  Engaged  Modes of Intervention:  Discussion    Melanee SpryByrd, Ori Trejos E 01/03/2013, 10:17 AM

## 2013-01-04 MED ORDER — TRAZODONE HCL 100 MG PO TABS
200.0000 mg | ORAL_TABLET | Freq: Every evening | ORAL | Status: DC | PRN
  Administered 2013-01-04 – 2013-01-06 (×6): 200 mg via ORAL
  Filled 2013-01-04 (×10): qty 2

## 2013-01-04 MED ORDER — LAMOTRIGINE 25 MG PO TABS
50.0000 mg | ORAL_TABLET | Freq: Two times a day (BID) | ORAL | Status: DC
  Administered 2013-01-04 – 2013-01-05 (×3): 50 mg via ORAL
  Filled 2013-01-04 (×6): qty 2

## 2013-01-04 MED ORDER — GABAPENTIN 300 MG PO CAPS
300.0000 mg | ORAL_CAPSULE | Freq: Three times a day (TID) | ORAL | Status: DC
  Administered 2013-01-04 – 2013-01-07 (×10): 300 mg via ORAL
  Filled 2013-01-04 (×15): qty 1

## 2013-01-04 MED ORDER — DULOXETINE HCL 30 MG PO CPEP
30.0000 mg | ORAL_CAPSULE | Freq: Every day | ORAL | Status: DC
  Administered 2013-01-05 – 2013-01-07 (×3): 30 mg via ORAL
  Filled 2013-01-04 (×6): qty 1

## 2013-01-04 NOTE — Progress Notes (Signed)
D: Patient's affect flat, depressed and mood is anxious. He reported on the self inventory sheet that he slept poorly, appetite is improving energy level is low and ability to pay attention is good. Patient rated depression and feelings of hopelessness "2". He's visible in the milieu and talking/laughing with peers in the dayroom. Continues to comply with current medication regimen.  A: Support and encouragement provided to patient. Scheduled medications administered per MD orders. Maintain Q15 minute checks for safety.  R: Patient receptive. Denies SI/HI. Patient remains safe.

## 2013-01-04 NOTE — [facility] Group Notes (Signed)
[facility] LCSW Group Therapy  Feelings Around Relapse 1:15 -2:30        01/04/2013  3:26 PM   Type of Therapy:  Group Therapy  Participation Level:  Appropriate  Participation Quality:  Appropriate  Affect:  Appropriate  Cognitive:  Attentive Appropriate  Insight:  Developing/Improving  Engagement in Therapy: Developing/Improving  Modes of Intervention:  Discussion Exploration Problem-Solving Supportive  Summary of Progress/Problems:  The topic for today was feelings around relapse.    Patient processed feelings toward relapse and was able to relate to peers. He shared relapse for him is isolating and then having SI with a plan. Patient identified coping skills that can be used to prevent a relapse.   [name], Ashlei Chinchilla Hairston 01/04/2013 3:26 PM

## 2013-01-04 NOTE — Tx Team (Signed)
Interdisciplinary Treatment Plan Update   Date Reviewed:  01/04/2013  Time Reviewed:  10:14 AM  Progress in Treatment:   Attending groups: Yes Participating in groups: Yes Taking medication as prescribed: Yes  Tolerating medication: Yes Family/Significant other contact made: Yes, contact made with family Patient understands diagnosis: Yes  Discussing patient identified problems/goals with staff: Yes Medical problems stabilized or resolved: Yes Denies suicidal/homicidal ideation: Yes Patient has not harmed self or others: Yes  For review of initial/current patient goals, please see plan of care.  Estimated Length of Stay:  2-3 days  Reasons for Continued Hospitalization:  Anxiety Depression Medication stabilization Suicidal ideation   New Problems/Goals identified:    Discharge Plan or Barriers:   Home with outpatient follow up  Additional Comments: N/A  Attendees:  Patient:  01/04/2013 10:14 AM   Signature: Silverio DecampJamison Lloyd, NP  01/04/2013 10:14 AM  Signature: 01/04/2013 10:14 AM  Signature: Harold Barbanonecia Byrd, RN 01/04/2013 10:14 AM  Signature: 01/04/2013 10:14 AM  Signature:   01/04/2013 10:14 AM  Signature:  Horace PorteousQuylle Emmalee Solivan, LCSW 01/04/2013 10:14 AM  Signature:  Reyes Ivanhelsea Horton, LCSW 01/04/2013 10:14 AM  Signature:   01/04/2013 10:14 AM  Signature:   01/04/2013 10:14 AM  Signature: Leighton ParodyBritney Tyson, RN 01/04/2013  10:14 AM  Signature:   01/04/2013  10:14 AM  Signature:   01/04/2013  10:14 AM    Scribe for Treatment Team:   Juline PatchQuylle Lu Paradise,  01/04/2013 10:14 AM

## 2013-01-04 NOTE — Progress Notes (Signed)
Patient states he had difficulty sleeping but refused repeat dose of trazodone.

## 2013-01-04 NOTE — Progress Notes (Signed)
Writer spoke with patient 1:1 and he is not happy because he reports that he wanted to be discharged on today but the doctor wants to monitor him since a medication has been changed/adjusted. Writer encouraged patient to try and stay positive instead of taking his frustrations out on others. He apologized to Clinical research associatewriter for how he talked to me because he was upset for other reasons and I had no control of the situation. Patient attended group this evening and participated, he c/o back pain and received motrin and robaxin along with heat packs to help with his pain. Patient currently denies si/hi/a/v hallucinations. Safety maintained with 15 min checks.

## 2013-01-04 NOTE — Progress Notes (Signed)
Patient ID: Zachary [name], male   DOB: [birth year], 43 y.o.   MRN: [id number] Patient ID: Zachary [name], male   DOB: [birth year], 43 y.o.   MRN: [id number] [facility]  01/04/2013 11:22 AM Zachary [name]  MRN:  [id number] Subjective:  He has complaining about depression, anxiety and suicidal ideation but contracts for safety in the hospital. Reportedly patient asking for physician to discuss with his medication management and at the same time he has been minimizing his psychiatric problems and maximizing his nonpsychiatric problems like chronic back pain. Patient has disturbance of sleep and appetite and somewhat related to chronic back pain.  Patient has suicidal ideations with intermittent thoughts of medication overdose. Patient rates depression at  5/10 and anxiety at 6/10. His stressors at time are family troubles and financial concerns.   Diagnosis:   DSM5: Depressive Disorders:  Major Depressive Disorder - Severe (296.23), Bipolar 1  Axis I: Anxiety Disorder NOS, Bipolar, Depressed and Major Depression, Recurrent severe Axis II: Deferred Axis III:  Past Medical History  Diagnosis Date  . Bipolar 1 disorder, depressed   . Arthritis   . Obesity   . Fibromyalgia    Axis IV: other psychosocial or environmental problems and problems related to social environment Axis V: 11-20 some danger of hurting self or others possible OR occasionally fails to maintain minimal personal hygiene OR gross impairment in communication  ADL's:  Intact  Sleep: Fair  Appetite:  Good  Suicidal Ideation:  Plan:  Taking Pills Intent:  Pt states he does not intend to, but thinks about it Means:  OD on medications at home, thoughts of this throughout day Homicidal Ideation:  Plan:  Denies Intent:  Denies Means:  Denies AEB (as evidenced by):  Psychiatric Specialty Exam: Review of Systems  Constitutional: Negative.   Eyes: Negative.   Respiratory: Negative.   Cardiovascular: Negative.    Gastrointestinal: Negative.   Genitourinary: Negative.   Musculoskeletal: Positive for back pain, joint pain (knees, chronic) and myalgias.  Skin: Negative.   Neurological: Positive for headaches.  Endo/Heme/Allergies: Negative.   Psychiatric/Behavioral: Positive for depression and suicidal ideas. Negative for hallucinations and substance abuse. The patient is nervous/anxious and has insomnia (pt states this is ongoing but exacerbated [facility]     Blood pressure 135/83, pulse 92, temperature 97.2 F (36.2 C), temperature source Oral, resp. rate 20, height 5\' 6"  (1.676 m), weight 171.46 kg (378 lb).Body mass index is 61.04 kg/(m^2).  General Appearance: Disheveled  Eye SolicitorContact::  Fair  Speech:  Normal Rate  Volume:  Decreased  Mood:  Anxious and Depressed  Affect:  Congruent  Thought Process:  Coherent  Orientation:  Full (Time, Place, and Person)  Thought Content:  WDL  Suicidal Thoughts:  Yes.  with intent/plan  Homicidal Thoughts:  No  Memory:  Immediate;   Fair Recent;   Fair Remote;   Fair  Judgement:  Fair  Insight:  Fair  Psychomotor Activity:  Decreased  Concentration:  Fair  Recall:  Fair  Akathisia:  No  Handed:  Right  AIMS (if indicated):     Assets:  Communication Skills Resilience  Sleep:  Number of Hours: 1.75     Current Medications: Current Facility-Administered Medications  Medication Dose Route Frequency Provider Last Rate Last Dose  . acetaminophen (TYLENOL) tablet 650 mg  650 mg Oral Q6H PRN Kerry HoughSpencer E Simon, PA-C   650 mg at 01/01/13 0240  . alum & mag hydroxide-simeth (MAALOX/MYLANTA)  200-200-20 MG/5ML suspension 30 mL  30 mL Oral Q4H PRN Kerry Hough, PA-C      . [START ON 01/05/2013] DULoxetine (CYMBALTA) DR capsule 30 mg  30 mg Oral Daily Nehemiah Settle, MD      . gabapentin (NEURONTIN) capsule 300 mg  300 mg Oral TID Nehemiah Settle, MD      . hydrOXYzine (ATARAX/VISTARIL) tablet 25 mg  25 mg Oral Q6H  PRN Nanine Means, NP   25 mg at 01/04/13 0118  . ibuprofen (ADVIL,MOTRIN) tablet 600 mg  600 mg Oral Q6H PRN Beau Fanny, FNP   600 mg at 01/04/13 0118  . lamoTRIgine (LAMICTAL) tablet 50 mg  50 mg Oral BID Nehemiah Settle, MD      . magnesium hydroxide (MILK OF MAGNESIA) suspension 30 mL  30 mL Oral Daily PRN Kerry Hough, PA-C      . methocarbamol (ROBAXIN) tablet 500 mg  500 mg Oral Q8H PRN Nanine Means, NP   500 mg at 01/04/13 0118  . traZODone (DESYREL) tablet 200 mg  200 mg Oral QHS,MR X 1 Nehemiah Settle, MD        Lab Results:  Results for orders placed during the hospital encounter of 01/01/13 (from the past 48 hour(s))  CBC WITH DIFFERENTIAL     Status: Abnormal   Collection Time    01/03/13  6:38 AM      Result Value Range   WBC 4.4  4.0 - 10.5 K/uL   RBC 4.50  4.22 - 5.81 MIL/uL   Hemoglobin 10.5 (*) 13.0 - 17.0 g/dL   HCT 95.6 (*) 21.3 - 08.6 %   MCV 76.4 (*) 78.0 - 100.0 fL   MCH 23.3 (*) 26.0 - 34.0 pg   MCHC 30.5  30.0 - 36.0 g/dL   RDW 57.8 (*) 46.9 - 62.9 %   Platelets 127 (*) 150 - 400 K/uL   Neutrophils Relative % 55  43 - 77 %   Neutro Abs 2.4  1.7 - 7.7 K/uL   Lymphocytes Relative 37  12 - 46 %   Lymphs Abs 1.6  0.7 - 4.0 K/uL   Monocytes Relative 7  3 - 12 %   Monocytes Absolute 0.3  0.1 - 1.0 K/uL   Eosinophils Relative 1  0 - 5 %   Eosinophils Absolute 0.1  0.0 - 0.7 K/uL   Basophils Relative 0  0 - 1 %   Basophils Absolute 0.0  0.0 - 0.1 K/uL   Comment: Performed at [facility]    Physical Findings: AIMS: Facial and Oral Movements Muscles of Facial Expression: None, normal Lips and Perioral Area: None, normal Jaw: None, normal Tongue: None, normal,Extremity Movements Upper (arms, wrists, hands, fingers): None, normal Lower (legs, knees, ankles, toes): Minimal, Trunk Movements Neck, shoulders, hips: None, normal, Overall Severity Severity of abnormal movements (highest score from questions above): None,  normal Incapacitation due to abnormal movements: None, normal Patient's awareness of abnormal movements (rate only patient's report): No Awareness, Dental Status Current problems with teeth and/or dentures?: No Does patient usually wear dentures?: No  CIWA:  CIWA-Ar Total: 4 COWS:  COWS Total Score: 3  Treatment Plan Summary: Daily contact with patient to assess and evaluate symptoms and progress in treatment Medication management  Plan: Review of chart, vital signs, medications, and notes.  1-Individual and group therapy  2-Medication management for depression and anxiety: Medications reviewed with the patient and he stated no untoward  effects aside from very mild tremor.  Increase Cymbalta 30 mg daily for depression Increasee Neurontin 300 mg as prescribed for mood swings and pain Increease Lamictal 50 mg twice a day for mood swings Increase Trazodone from 200 mg qhs for insomnia Continue Motrin PRN for back, knee, and generalized joint pain. 3-Coping skills for depression, anxiety  4-Continue crisis stabilization and management  5-Address health issues--monitoring vital signs, stable  6-Treatment plan in progress to prevent relapse of depression and anxiety 7. Disposition plans are in progress may be discharged on Monday when he can continue contracts for safety and to show clinical improvement.   Medical Decision Making Problem Points:  Established problem, worsening (2) and Review of psycho-social stressors (1) Data Points:  Review of medication regiment & side effects (2) Review of new medications or change in dosage (2)  I certify that inpatient services furnished can reasonably be expected to improve the patient's condition.   Nehemiah Settle.,  M.D.  01/04/2013, 11:22 AM

## 2013-01-05 MED ORDER — CYCLOBENZAPRINE HCL 10 MG PO TABS
10.0000 mg | ORAL_TABLET | Freq: Three times a day (TID) | ORAL | Status: DC
  Administered 2013-01-05 – 2013-01-07 (×6): 10 mg via ORAL
  Filled 2013-01-05 (×3): qty 1
  Filled 2013-01-05: qty 2
  Filled 2013-01-05 (×2): qty 1
  Filled 2013-01-05: qty 2
  Filled 2013-01-05 (×6): qty 1

## 2013-01-05 MED ORDER — VITAMIN B-1 50 MG PO TABS
50.0000 mg | ORAL_TABLET | Freq: Every day | ORAL | Status: DC
  Filled 2013-01-05 (×2): qty 1

## 2013-01-05 MED ORDER — FERROUS SULFATE 325 (65 FE) MG PO TABS
325.0000 mg | ORAL_TABLET | Freq: Two times a day (BID) | ORAL | Status: DC
  Administered 2013-01-06 – 2013-01-07 (×3): 325 mg via ORAL
  Filled 2013-01-05 (×7): qty 1

## 2013-01-05 MED ORDER — ADULT MULTIVITAMIN W/MINERALS CH
1.0000 | ORAL_TABLET | Freq: Every day | ORAL | Status: DC
  Administered 2013-01-06 – 2013-01-07 (×2): 1 via ORAL
  Filled 2013-01-05 (×4): qty 1

## 2013-01-05 MED ORDER — FOLIC ACID 1 MG PO TABS
1.0000 mg | ORAL_TABLET | Freq: Every day | ORAL | Status: DC
  Administered 2013-01-06 – 2013-01-07 (×2): 1 mg via ORAL
  Filled 2013-01-05 (×4): qty 1

## 2013-01-05 MED ORDER — PANTOPRAZOLE SODIUM 20 MG PO TBEC
20.0000 mg | DELAYED_RELEASE_TABLET | Freq: Every day | ORAL | Status: DC
  Administered 2013-01-05 – 2013-01-06 (×2): 20 mg via ORAL
  Filled 2013-01-05 (×6): qty 1

## 2013-01-05 MED ORDER — NABUMETONE 500 MG PO TABS
500.0000 mg | ORAL_TABLET | Freq: Two times a day (BID) | ORAL | Status: DC
  Administered 2013-01-05 – 2013-01-06 (×2): 500 mg via ORAL
  Filled 2013-01-05 (×5): qty 1

## 2013-01-05 NOTE — Progress Notes (Signed)
Patient ID: Zachary DodrillJames E Montour, male   DOB: 1970/07/08, 43 y.o.   MRN: [id number] [facility]  01/05/2013 3:36 PM Zachary DodrillJames E [name]  MRN:  [id number] Subjective:  Patient stated he is still having back spasms and the Robaxin is not working, changed to Dillard'sFlexeril.  Relafen 500 mg BID for back pain added with Protonix 20 mg daily for GERD.  Depression better but suicidal ideations are "always in the back of my mind".  His labs came back with anemia--low hemoglobin and HCT, supplements added to his medications.  He complains of "phantom smells" that his regular psychiatrist is addressing.  Pleasant, cooperative, interacting well on the unit  Diagnosis:   DSM5: Depressive Disorders:  Major Depressive Disorder - Severe (296.23), Bipolar 1  Axis I: Anxiety Disorder NOS, Bipolar, Depressed and Major Depression, Recurrent severe Axis II: Deferred Axis III:  Past Medical History  Diagnosis Date  . Bipolar 1 disorder, depressed   . Arthritis   . Obesity   . Fibromyalgia    Axis IV: other psychosocial or environmental problems and problems related to social environment Axis V: 11-20 some danger of hurting self or others possible OR occasionally fails to maintain minimal personal hygiene OR gross impairment in communication  ADL's:  Intact  Sleep: Fair  Appetite:  Good  Suicidal Ideation:  Plan:  Taking Pills Intent:  Pt states he does not intend to, but thinks about it Means:  OD on medications at home, thoughts of this throughout day Homicidal Ideation:  Plan:  Denies Intent:  Denies Means:  Denies AEB (as evidenced by):  Psychiatric Specialty Exam: Review of Systems  Constitutional: Negative.   Eyes: Negative.   Respiratory: Negative.   Cardiovascular: Negative.   Gastrointestinal: Negative.   Genitourinary: Negative.   Musculoskeletal: Positive for back pain, joint pain (knees, chronic) and myalgias.  Skin: Negative.   Neurological: Positive for headaches.   Endo/Heme/Allergies: Negative.   Psychiatric/Behavioral: Positive for depression and suicidal ideas. Negative for hallucinations and substance abuse. The patient is nervous/anxious and has insomnia (pt states this is ongoing but exacerbated by [facility]     Blood pressure 139/81, pulse 54, temperature 97.2 F (36.2 C), temperature source Oral, resp. rate 18, height 5\' 6"  (1.676 m), weight 171.46 kg (378 lb).Body mass index is 61.04 kg/(m^2).  General Appearance: Neat  Eye Contact::  Fair  Speech:  Normal Rate  Volume:  Decreased  Mood:  Anxious and Depressed  Affect:  Congruent  Thought Process:  Coherent  Orientation:  Full (Time, Place, and Person)  Thought Content:  WDL  Suicidal Thoughts:  Yes.  with intent/plan  Homicidal Thoughts:  No  Memory:  Immediate;   Fair Recent;   Fair Remote;   Fair  Judgement:  Fair  Insight:  Fair  Psychomotor Activity:  Decreased  Concentration:  Fair  Recall:  Fair  Akathisia:  No  Handed:  Right  AIMS (if indicated):     Assets:  Communication Skills Resilience  Sleep:  Number of Hours: 5.5     Current Medications: Current Facility-Administered Medications  Medication Dose Route Frequency Provider Last Rate Last Dose  . acetaminophen (TYLENOL) tablet 650 mg  650 mg Oral Q6H PRN Kerry HoughSpencer E Simon, PA-C   650 mg at 01/01/13 0240  . alum & mag hydroxide-simeth (MAALOX/MYLANTA) 200-200-20 MG/5ML suspension 30 mL  30 mL Oral Q4H PRN Kerry HoughSpencer E Simon, PA-C      . DULoxetine (CYMBALTA) DR capsule 30 mg  30 mg Oral Daily Nehemiah Settle, MD   30 mg at 01/05/13 0834  . gabapentin (NEURONTIN) capsule 300 mg  300 mg Oral TID Nehemiah Settle, MD   300 mg at 01/05/13 1432  . hydrOXYzine (ATARAX/VISTARIL) tablet 25 mg  25 mg Oral Q6H PRN Nanine Means, NP   25 mg at 01/04/13 0118  . ibuprofen (ADVIL,MOTRIN) tablet 600 mg  600 mg Oral Q6H PRN Beau Fanny, FNP   600 mg at 01/05/13 5784  . lamoTRIgine (LAMICTAL)  tablet 50 mg  50 mg Oral BID Nehemiah Settle, MD   50 mg at 01/05/13 0834  . magnesium hydroxide (MILK OF MAGNESIA) suspension 30 mL  30 mL Oral Daily PRN Kerry Hough, PA-C      . methocarbamol (ROBAXIN) tablet 500 mg  500 mg Oral Q8H PRN Nanine Means, NP   500 mg at 01/05/13 0837  . traZODone (DESYREL) tablet 200 mg  200 mg Oral QHS,MR X 1 Nehemiah Settle, MD   200 mg at 01/04/13 2221    Lab Results:  No results found for this or any previous visit (from the past 48 hour(s)).  Physical Findings: AIMS: Facial and Oral Movements Muscles of Facial Expression: None, normal Lips and Perioral Area: None, normal Jaw: None, normal Tongue: None, normal,Extremity Movements Upper (arms, wrists, hands, fingers): None, normal Lower (legs, knees, ankles, toes): None, normal, Trunk Movements Neck, shoulders, hips: None, normal, Overall Severity Severity of abnormal movements (highest score from questions above): None, normal Incapacitation due to abnormal movements: None, normal Patient's awareness of abnormal movements (rate only patient's report): No Awareness, Dental Status Current problems with teeth and/or dentures?: No Does patient usually wear dentures?: No  CIWA:  CIWA-Ar Total: 4 COWS:  COWS Total Score: 3  Treatment Plan Summary: Daily contact with patient to assess and evaluate symptoms and progress in treatment Medication management  Plan: Review of chart, vital signs, medications, and notes.  1-Individual and group therapy  2-Medication management for depression and anxiety: Medications reviewed with the patient and he stated no untoward effects, Flexeril replacing Robaxin for muscle spasms, Relafen for back pain started with Protonix 20 mg daily.  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 3-Coping skills for depression, anxiety  4-Continue crisis stabilization and management  5-Address health issues--monitoring vital signs, stable  6-Treatment plan in progress to prevent relapse of depression and anxiety 7. Disposition plans are in progress may be discharged on Monday when he can continue contracts for safety and to show clinical improvement.   Medical Decision Making Problem Points:  Established problem, worsening (2) and Review of psycho-social stressors (1) Data Points:  Review of medication regiment & side effects (2) Review of new medications or change in dosage (2)  I certify that inpatient services furnished can reasonably be expected to improve the patient's condition.   Nanine Means,  PMH-NP  01/05/2013, 3:36 PM  Agree with assessment and plan Madie Reno A. Dub Mikes, M.D.

## 2013-01-05 NOTE — Progress Notes (Signed)
Psychoeducational Group Note  Date: 01/05/2013 Time:  1015  Group Topic/Focus:  Identifying Needs:   The focus of this group is to help patients identify their personal needs that have been historically problematic and identify healthy behaviors to address their needs.  Participation Level:  Active  Participation Quality:  Appropriate  Affect:  Appropriate  Cognitive:  Oriented  Insight:  Engaged  Engagement in Group:  Engaged  Additional Comments:  Pt participated and is engaged in the group  Najir Roop A 

## 2013-01-05 NOTE — Progress Notes (Signed)
Adult Psychoeducational Group Note  Date:  01/05/2013 Time:  10:47 PM  Group Topic/Focus:  Wrap-Up Group:   The focus of this group is to help patients review their daily goal of treatment and discuss progress on daily workbooks.  Participation Level:  Active  Participation Quality:  Attentive  Affect:  Appropriate  Cognitive:  Alert  Insight: Appropriate  Engagement in Group:  Engaged  Modes of Intervention:  Discussion  Additional Comments:  Patient says that he had an OK day. Pt says that he wants to try not to stress so much and he is working on skills that he needs to get out of here.  [facility] CenterJOHNSON,TAWANA 01/05/2013, 10:47 PM

## 2013-01-05 NOTE — Progress Notes (Signed)
.  Psychoeducational Group Note    Date: 01/05/2013 Time:  0930    Goal Setting Purpose of Group: To be able to set a goal that is measurable and that can be accomplished in one day Participation Level:  Active  Participation Quality:  Appropriate  Affect:  Appropriate  Cognitive:  Oriented  Insight:  Improving  Engagement in Group:  Engaged  Additional Comments:  Engaged and participated in the group  Ashely Joshua A 

## 2013-01-05 NOTE — Progress Notes (Signed)
[facility] Group Notes:  (Nursing/MHT/Case Management/Adjunct)  Date:  01/05/2013  Time:  12:26 AM  Type of Therapy:  Group Therapy  Participation Level:  None  Participation Quality:  Pt. came into group late  Affect:  Flat  Cognitive:  Appropriate  Insight:  None  Engagement in Group:  None  Modes of Intervention:  Socialization and Support  Summary of Progress/Problems: Pt. Listened in group activity, but had no insight.  Sondra ComeWilson, Joeanne Robicheaux J 01/05/2013, 12:26 AM

## 2013-01-05 NOTE — [facility] Group Notes (Signed)
[facility] Group Notes:  (Clinical Social Work)  01/05/2013   3:00-4:00PM  Summary of Progress/Problems:   The main focus of today's process group was for the patient to identify ways in which they have sabotaged their own mental health wellness/recovery.  Motivational interviewing was used to explore the reasons they engage in this behavior, and reasons they may have for wanting to change.  The Stages of Change were explained to the group using a handout, and patients identified where they are with regard to changing self-defeating behaviors.  The patient expressed that he self sabotages with self hatred.  He was the youngest of three children and was not planned, was never liked by his mother.  His older "model" brother was always in trouble but was loved, and he committed suicide.  He called the patient the night before his suicide and the patient has survivor's guilt, wonders whether he could have done something to stop him.  Type of Therapy:  Process Group  Participation Level:  Active  Participation Quality:  Attentive, Sharing and Supportive  Affect:  Depressed  Cognitive:  Appropriate and Oriented  Insight:  Engaged  Engagement in Therapy:  Engaged  Modes of Intervention:  Education, Motivational Interviewing   Ambrose MantleMareida Grossman-Orr, LCSW 01/05/2013, 4:00pm

## 2013-01-06 MED ORDER — DIPHENHYDRAMINE HCL 25 MG PO CAPS
25.0000 mg | ORAL_CAPSULE | ORAL | Status: DC | PRN
  Administered 2013-01-06 (×2): 25 mg via ORAL
  Filled 2013-01-06: qty 1

## 2013-01-06 MED ORDER — DIPHENHYDRAMINE HCL 25 MG PO CAPS
ORAL_CAPSULE | ORAL | Status: AC
  Administered 2013-01-06: 25 mg
  Filled 2013-01-06: qty 1

## 2013-01-06 MED ORDER — POTASSIUM CHLORIDE CRYS ER 10 MEQ PO TBCR
10.0000 meq | EXTENDED_RELEASE_TABLET | Freq: Every day | ORAL | Status: DC
  Administered 2013-01-06 – 2013-01-07 (×3): 10 meq via ORAL
  Filled 2013-01-06 (×5): qty 1

## 2013-01-06 MED ORDER — HYDROCHLOROTHIAZIDE 25 MG PO TABS
25.0000 mg | ORAL_TABLET | Freq: Every day | ORAL | Status: DC
Start: 2013-01-06 — End: 2013-01-07
  Administered 2013-01-06 – 2013-01-07 (×2): 25 mg via ORAL
  Filled 2013-01-06 (×6): qty 1

## 2013-01-06 MED ORDER — VITAMIN B-1 100 MG PO TABS
100.0000 mg | ORAL_TABLET | Freq: Every day | ORAL | Status: DC
  Administered 2013-01-06 – 2013-01-07 (×2): 100 mg via ORAL
  Filled 2013-01-06 (×4): qty 1

## 2013-01-06 NOTE — [facility] Group Notes (Signed)
[facility] Group Notes: (Clinical Social Work)   01/06/2013      Type of Therapy:  Group Therapy   Participation Level:  Did Not Attend    Keani Gotcher Grossman-Orr, LCSW 01/06/2013, 3:10 PM     

## 2013-01-06 NOTE — Progress Notes (Signed)
Psychoeducational Group Note  Date:  01/06/2013 Time:  1015  Group Topic/Focus:  Making Healthy Choices:   The focus of this group is to help patients identify negative/unhealthy choices they were using prior to admission and identify positive/healthier coping strategies to replace them upon discharge.  Participation Level:  Active  Participation Quality:  Appropriate  Affect:  Appropriate  Cognitive:  Oriented  Insight:  Improving  Engagement in Group:  Engaged  Additional Comments:  Pt has been very much a part of the group with interaction, comments insights and questions.  Remiel Corti A 01/06/2013 

## 2013-01-06 NOTE — Progress Notes (Signed)
D) Pt has remained somatically focused with many complaints of discomfort. This morning Pt complained of a rash on his legs and also that he was having his food get stuck in his throat. This Clinical research associatewriter called the pharmacist and consulted her concerning which meds could be affecting Pt. Pt was started on Lamictal and that was stopped and also a muscle relaxer which was started yesterday was also stopped. 25 mg of Vistaril was given to Pt. At that time. Pt also has tried to monopolize the groups and bring the focus to himself. Rates his depression at a 2 and his hopelessness at a 3. Has thoughts of SI on and off.  A) Pt given support and provided with a 1:1 several times throughout the day. Encouraged to focus on his own issues and spend the time and energy on himself rather than on others.  R) Pt continues to focus on others and to have somatic complaints. Contracts fo rhis safety while on the unit.

## 2013-01-06 NOTE — Progress Notes (Signed)
[facility] Group Notes:  (Nursing/MHT/Case Management/Adjunct)  Date:  01/06/2013  Time:  8:00 p.m.   Type of Therapy:  Psychoeducational Skills  Participation Level:  Active  Participation Quality:  Appropriate  Affect:  Appropriate  Cognitive:  Appropriate  Insight:  Good  Engagement in Group:  Engaged  Modes of Intervention:  Education  Summary of Progress/Problems: The patient states that he had an "okay" day overall. He credits his positive experiences to being able to share more with his peers.  In addition, he mentioned that he "broke down" at one point today, but was able to handle the situation appropriately. As a theme for the day, he verbalized that his friend and significant other will serve as his support system.   Camri Molloy S 01/06/2013, 10:06 PM

## 2013-01-06 NOTE — Progress Notes (Signed)
Psychoeducational Group Note  Date: 01/06/2013 Time:  0930 Group Topic/Focus:  Gratefulness:  The focus of this group is to help patients identify what two things they are most grateful for in their lives. What helps ground them and to center them on their work to their recovery.  Participation Level:  Active  Participation Quality:  Appropriate  Affect:  Appropriate  Cognitive:  Oriented  Insight:  Improving  Engagement in Group:  Engaged  Additional Comments:  Pt. Was active in paying attention and participating.  Keanthony Poole A  

## 2013-01-06 NOTE — Progress Notes (Signed)
Patient ID: Zachary [name], male   DOB: 05-23-1970, 43 y.o.   MRN: [id number] [facility] MD Progress Note  01/06/2013 2:12 PM JAXAN MICHEL  MRN:  [id number] Subjective:  Very somatic, focused on every possible ache.  He even described his urine making a cloud like formation once this morning but no signs and symptoms of UTI.  Depression improved with suicidal ideations as his reported baseline.  Sleep and appetite are not good.  Diagnosis:   DSM5: Depressive Disorders:  Major Depressive Disorder - Severe (296.23), Bipolar 1  Axis I: Anxiety Disorder NOS, Bipolar, Depressed and Major Depression, Recurrent severe Axis II: Deferred Axis III:  Past Medical History  Diagnosis Date  . Bipolar 1 disorder, depressed   . Arthritis   . Obesity   . Fibromyalgia    Axis IV: other psychosocial or environmental problems and problems related to social environment Axis V: 11-20 some danger of hurting self or others possible OR occasionally fails to maintain minimal personal hygiene OR gross impairment in communication  ADL's:  Intact  Sleep: Fair  Appetite:  Good  Suicidal Ideation:  Plan:  Taking Pills Intent:  Pt states he does not intend to, but thinks about it Means:  OD on medications at home, thoughts of this throughout day Homicidal Ideation:  Plan:  Denies Intent:  Denies Means:  Denies AEB (as evidenced by):  Psychiatric Specialty Exam: Review of Systems  Constitutional: Negative.   Eyes: Negative.   Respiratory: Negative.   Cardiovascular: Negative.   Gastrointestinal: Negative.   Genitourinary: Negative.   Musculoskeletal: Positive for back pain, joint pain (knees, chronic) and myalgias.  Skin: Negative.   Neurological: Positive for headaches.  Endo/Heme/Allergies: Negative.   Psychiatric/Behavioral: Positive for depression and suicidal ideas. Negative for hallucinations and substance abuse. The patient is nervous/anxious and has insomnia (pt states this is ongoing but  exacerbated by roommate's snoring at [facility]).     Blood pressure 125/84, pulse 105, temperature 97.3 F (36.3 C), temperature source Oral, resp. rate 16, height 5\' 6"  (1.676 m), weight 171.46 kg (378 lb).Body mass index is 61.04 kg/(m^2).  General Appearance: Neat  Eye Contact::  Fair  Speech:  Normal Rate  Volume:  Decreased  Mood:  Anxious and Depressed  Affect:  Congruent  Thought Process:  Coherent  Orientation:  Full (Time, Place, and Person)  Thought Content:  WDL  Suicidal Thoughts:  Yes, without a plan  Homicidal Thoughts:  No  Memory:  Immediate;   Fair Recent;   Fair Remote;   Fair  Judgement:  Fair  Insight:  Fair  Psychomotor Activity:  Decreased  Concentration:  Fair  Recall:  Fair  Akathisia:  No  Handed:  Right  AIMS (if indicated):     Assets:  Communication Skills Resilience  Sleep:  Number of Hours: 4     Current Medications: Current Facility-Administered Medications  Medication Dose Route Frequency Provider Last Rate Last Dose  . acetaminophen (TYLENOL) tablet 650 mg  650 mg Oral Q6H PRN Kerry Hough, PA-C   650 mg at 01/01/13 0240  . alum & mag hydroxide-simeth (MAALOX/MYLANTA) 200-200-20 MG/5ML suspension 30 mL  30 mL Oral Q4H PRN Kerry Hough, PA-C      . cyclobenzaprine (FLEXERIL) tablet 10 mg  10 mg Oral TID Nanine Means, NP   10 mg at 01/06/13 0850  . diphenhydrAMINE (BENADRYL) capsule 25 mg  25 mg Oral Q4H PRN Nanine Means, NP   25 mg at 01/06/13 0915  .  DULoxetine (CYMBALTA) DR capsule 30 mg  30 mg Oral Daily Nehemiah SettleJanardhaha R Jonnalagadda, MD   30 mg at 01/05/13 0834  . ferrous sulfate tablet 325 mg  325 mg Oral BID WC Nanine MeansJamison Lord, NP   325 mg at 01/06/13 0852  . folic acid (FOLVITE) tablet 1 mg  1 mg Oral Daily Nanine MeansJamison Lord, NP   1 mg at 01/06/13 0850  . gabapentin (NEURONTIN) capsule 300 mg  300 mg Oral TID Nehemiah SettleJanardhaha R Jonnalagadda, MD   300 mg at 01/06/13 0849  . hydrochlorothiazide (HYDRODIURIL) tablet 25 mg  25 mg Oral Daily Nanine MeansJamison Lord, NP       . hydrOXYzine (ATARAX/VISTARIL) tablet 25 mg  25 mg Oral Q6H PRN Nanine MeansJamison Lord, NP   25 mg at 01/04/13 0118  . ibuprofen (ADVIL,MOTRIN) tablet 600 mg  600 mg Oral Q6H PRN Beau FannyJohn C Withrow, FNP   600 mg at 01/06/13 0500  . magnesium hydroxide (MILK OF MAGNESIA) suspension 30 mL  30 mL Oral Daily PRN Kerry HoughSpencer E Simon, PA-C      . multivitamin with minerals tablet 1 tablet  1 tablet Oral Daily Nanine MeansJamison Lord, NP   1 tablet at 01/06/13 0850  . nabumetone (RELAFEN) tablet 500 mg  500 mg Oral BID Nanine MeansJamison Lord, NP   500 mg at 01/06/13 0851  . pantoprazole (PROTONIX) EC tablet 20 mg  20 mg Oral Daily Nanine MeansJamison Lord, NP   20 mg at 01/06/13 0851  . potassium chloride (K-DUR,KLOR-CON) CR tablet 10 mEq  10 mEq Oral Daily Nanine MeansJamison Lord, NP      . thiamine (VITAMIN B-1) tablet 100 mg  100 mg Oral Daily Nehemiah SettleJanardhaha R Jonnalagadda, MD   100 mg at 01/06/13 0851  . traZODone (DESYREL) tablet 200 mg  200 mg Oral QHS,MR X 1 Nehemiah SettleJanardhaha R Jonnalagadda, MD   200 mg at 01/05/13 2214    Lab Results:  No results found for this or any previous visit (from the past 48 hour(s)).  Physical Findings: AIMS: Facial and Oral Movements Muscles of Facial Expression: None, normal Lips and Perioral Area: None, normal Jaw: None, normal Tongue: None, normal,Extremity Movements Upper (arms, wrists, hands, fingers): None, normal Lower (legs, knees, ankles, toes): None, normal, Trunk Movements Neck, shoulders, hips: None, normal, Overall Severity Severity of abnormal movements (highest score from questions above): None, normal Incapacitation due to abnormal movements: None, normal Patient's awareness of abnormal movements (rate only patient's report): No Awareness, Dental Status Current problems with teeth and/or dentures?: No Does patient usually wear dentures?: No  CIWA:  CIWA-Ar Total: 4 COWS:  COWS Total Score: 3  Treatment Plan Summary: Daily contact with patient to assess and evaluate symptoms and progress in  treatment Medication management  Plan: Review of chart, vital signs, medications, and notes.  1-Individual and group therapy  2-Medication management for depression and anxiety: Medications reviewed with the patient and he stated no untoward effects, Flexeril replacing Robaxin for muscle spasms, Relafen for back pain started with Protonix 20 mg daily.  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 3-Coping skills for depression, anxiety  4-Continue crisis stabilization and management  5-Address health issues--monitoring vital signs, stable  6-Treatment plan in progress to prevent relapse of depression and anxiety 7. Disposition plans are in progress may be discharged on Monday when he can continue contracts for  safety and to show clinical improvement.   Medical Decision Making Problem Points:  Established problem, worsening (2) and Review of psycho-social stressors (1) Data Points:  Review of medication regiment & side effects (2) Review of new medications or change in dosage (2)  I certify that inpatient services furnished can reasonably be expected to improve the patient's condition.   Nanine Means,  PMH-NP  01/06/2013, 2:12 PM  Agree with assessment and plan Madie Reno A. Dub Mikes, M.D.

## 2013-01-06 NOTE — Progress Notes (Signed)
Patient has been up in the dayroom this evening, attended group and participated. Patient requested his knee brace to wear because he reports that sometimes his knees will go out on him. Writer received an order for his brace. Patient reports his day has been good and is aware of the medication changes. Patient has passive si and verbally contracts for safety. Patient denies hi/a/v hallucination. Safety maintained on unit with 15 min checks.

## 2013-01-07 DIAGNOSIS — F314 Bipolar disorder, current episode depressed, severe, without psychotic features: Principal | ICD-10-CM

## 2013-01-07 DIAGNOSIS — F4310 Post-traumatic stress disorder, unspecified: Secondary | ICD-10-CM

## 2013-01-07 MED ORDER — FERROUS SULFATE 325 (65 FE) MG PO TABS: 325.0000 mg | ORAL_TABLET | Freq: Two times a day (BID) | ORAL | Status: DC

## 2013-01-07 MED ORDER — GABAPENTIN 300 MG PO CAPS: 300.0000 mg | ORAL_CAPSULE | Freq: Three times a day (TID) | ORAL | Status: DC

## 2013-01-07 MED ORDER — CYCLOBENZAPRINE HCL 10 MG PO TABS: 10.0000 mg | ORAL_TABLET | Freq: Three times a day (TID) | ORAL | Status: DC | PRN

## 2013-01-07 MED ORDER — TRAZODONE HCL 100 MG PO TABS: 200.0000 mg | ORAL_TABLET | Freq: Every evening | ORAL | Status: DC | PRN

## 2013-01-07 MED ORDER — HYDROCHLOROTHIAZIDE 25 MG PO TABS: 25.0000 mg | ORAL_TABLET | Freq: Every day | ORAL | Status: DC

## 2013-01-07 MED ORDER — DULOXETINE HCL 30 MG PO CPEP: 30.0000 mg | ORAL_CAPSULE | Freq: Every day | ORAL | Status: DC

## 2013-01-07 NOTE — Progress Notes (Signed)
Patient has been observed up in the dayroom talking and laughing with select peers. Writer spoke with patient 1:1 and he is requesting that his doctor be changed to Dr. Dub MikesLugo, he reports that he has not had a good experience with his current doctor. Writer informed patient that this request would be told to his day shift nurse and he would need to ask for the change also. Patient is hoping to discharge on tomorrow and feels that he can follow up with his current therapist and doctor. Patient denies si/hi/a/v hallucinations. Safety maintained on unit with 15 min checks.

## 2013-01-07 NOTE — [facility] Group Notes (Signed)
[facility] LCSW Group Therapy          Overcoming Obstacles       1:15 -2:30        01/07/2013   2:52 PM     Type of Therapy:  Group Therapy  Participation Level:  Appropriate  Participation Quality:  Appropriate  Affect:  Appropriate, Alert  Cognitive:  Attentive Appropriate  Insight: Developing/Improving Engaged  Engagement in Therapy: Developing/Imprvoing Engaged  Modes of Intervention:  Discussion Exploration  Education Rapport BuildingProblem-Solving Support  Summary of Progress/Problems:  The main focus of today's group was overcoming obstacles.  Patient shared he has financial problems that were overwhelming prior to admission.  He shared has face them and chip them down into manageable chunks.  Patient able to identify appropriate coping skills.   Zachary [name], Zachary [name] 01/07/2013    2:52 PM

## 2013-01-07 NOTE — Progress Notes (Signed)
Discharge Note:  Patient discharged home with girlfriend. Patient denied SI and HI.  Denied A/V hallucinations.  Denied pain.  Suicide prevention information given and discussed with patient, stated he understood and had no questions.  Patient stated he received all his belongings, clothing, miscellaneous items, toiletries, prescriptions, medications.  Patient stated [facility].

## 2013-01-07 NOTE — Progress Notes (Signed)
D:  Patient rated depression 1-2, anxiety 3-4.   Patient stated he does have SI off/on, contracts for safety which is baseline for him.  Denied HI.  Denied A/V hallucinations.  [facility] A:  Medications administered per MD orders.  Emotional support and encouragement given patient. R:  Denied HI.  Denied A/V hallucinations.  SI off/on, contracts for safety.  Will continue to monitor patient for safety with 15 minute checks.  Safety maintained.

## 2013-01-07 NOTE — [facility] Suicide Risk Assessment (Signed)
Suicide Risk Assessment  Discharge Assessment     Demographic Factors:  Male, Adolescent or young adult, Caucasian, Low socioeconomic status and Unemployed  Mental Status Per Nursing Assessment::   On Admission:  NA  Current Mental Status by Physician: Patient is calm and cooperative. Patient has normal psychomotor activity. Patient has good mood with the appropriate and bright affect. Patient has normal speech and thought process. Patient has denied suicidal ideation, intention or plans. Patient has no homicidal ideation or evidence of psychosis.  Loss Factors: Decline in physical health and Financial problems/change in socioeconomic status  Historical Factors: Prior suicide attempts, Family history of mental illness or substance abuse and Impulsivity  Risk Reduction Factors:   Sense of responsibility to family, Religious beliefs about death, Living with another person, especially a relative, Positive social support, Positive therapeutic relationship and Positive coping skills or problem solving skills  Continued Clinical Symptoms:  Severe Anxiety and/or Agitation Bipolar Disorder:   Depressive phase Depression:   Recent sense of peace/wellbeing Unstable or Poor Therapeutic Relationship Previous Psychiatric Diagnoses and Treatments Medical Diagnoses and Treatments/Surgeries  Cognitive Features That Contribute To Risk:  Polarized thinking    Suicide Risk:  Minimal: No identifiable suicidal ideation.  Patients presenting with no risk factors but with morbid ruminations; may be classified as minimal risk based on the severity of the depressive symptoms  Discharge Diagnoses:   AXIS I:  Bipolar, Depressed and Post Traumatic Stress Disorder AXIS II:  Deferred AXIS III:   Past Medical History  Diagnosis Date  . Bipolar 1 disorder, depressed   . Arthritis   . Obesity   . Fibromyalgia    AXIS IV:  economic problems, other psychosocial or environmental problems, problems  related to social environment and problems with primary support group AXIS V:  61-70 mild symptoms  Plan Of Care/Follow-up recommendations:  Activity:  Has Tolerated Diet:  Regular  Is patient on multiple antipsychotic therapies at discharge:  No   Has Patient had three or more failed trials of antipsychotic monotherapy by history:  No  Recommended Plan for Multiple Antipsychotic Therapies: NA  Cleone Hulick,JANARDHAHA R. 01/07/2013, 12:30 PM

## 2013-01-07 NOTE — Progress Notes (Addendum)
[facility] :  Will you be returning to the same living situation after discharge: Yes,  Patient is returning to his home. At discharge, do you have transportation home?:Yes,  Patient has transportation home. Do you have the ability to pay for your medications:No.  Patient needs assistance with indigent medications   Release of information consent forms completed and in the chart;  Patient's signature needed at discharge.  Patient to Follow up at: [facility], [facility] [phone] Thursday, January 18, 2012 at 9 AM  [address] [place], [place]   [id number]  Patient denies SI/HI:  Patient is endorsing chronic SI but no intent to act on thoughts.   Safety Planning and Suicide Prevention discussed:  .Reviewed with all patients during discharge planning group   Mozelle Remlinger, Joesph JulyQuylle Hairston 01/07/2013, 10:22 AM

## 2013-01-07 NOTE — Discharge Summary (Signed)
Physician Discharge Summary Note  Patient:  Zachary [name] is an 43 y.o., male MRN:  [id number] DOB:  07/26/1970 Patient phone:  [phone] (home)  Patient address:   [address] [place] [address],   Date of Admission:  01/01/2013 Date of Discharge:01/07/2013  Reason for Admission:  Major Depression, Recurrent, Severe; PTSD, Anxiety  Discharge Diagnoses: Principal Problem:   Bipolar I disorder, most recent episode (or current) depressed, severe, without mention of psychotic behavior Active Problems:   MDD (major depressive disorder)   Anxiety state, unspecified   Posttraumatic stress disorder  Review of Systems  Constitutional: Negative.   Eyes: Negative.   Respiratory: Negative.   Cardiovascular: Negative.   Gastrointestinal: Negative.   Genitourinary: Negative.   Musculoskeletal: Positive for back pain, joint pain, myalgias and neck pain.  Skin: Negative.   Neurological: Negative.   Endo/Heme/Allergies: Negative.   Psychiatric/Behavioral: Positive for depression and suicidal ideas (reports occurrence of over a decade, but no plans or intent). Negative for hallucinations, memory loss and substance abuse. The patient is nervous/anxious and has insomnia.     DSM5: Depressive Disorders:  Major Depressive Disorder - Severe (296.23) PTSD Axis Diagnosis:   AXIS I:  Anxiety Disorder NOS, Major Depression, Recurrent severe and Post Traumatic Stress Disorder AXIS II:  Deferred AXIS III:   Past Medical History  Diagnosis Date  . Bipolar 1 disorder, depressed   . Arthritis   . Obesity   . Fibromyalgia    AXIS IV:  other psychosocial or environmental problems and problems related to social environment AXIS V:  61-70 mild symptoms  Level of Care:  OP  Hospital Course:   Pt presents to [facility] today voluntarily for SI with plan to "cut wrists, overdose, or walk into traffic." Pt states that he has had several suicide attempts in the past. Pt states,  "I've been having real issues with stress. I'm bipolar. I've been talking to a therapist and they have been trying to get me in. I decided today was the day. I deal with a lot of mental stress." Pt is unemployed and has been denied for disability. Pt states that he has no family left that he communicates with. Pt also states that he has flashbacks of his brother calling him the night before he killed himself. Pt states that he thinks his mother was bipolar. Pt states that his sister has substance abuse issues. Pt has unsteady gait and ambulates with a cane in the home. Pt receives therapy from Luana Shu with [facility] the [place]. Pt denies any inpatient treatment in the past.   During Hospitalization: Medications managed, psychoeducation, group and individual therapy. Pt currently denies SI, HI, and Psychosis.    Consults:  psychiatry  Significant Diagnostic Studies:  None  Discharge Vitals:   Blood pressure 154/78, pulse 75, temperature 97.5 F (36.4 C), temperature source Oral, resp. rate 18, height 5\' 6"  (1.676 m), weight 171.46 kg (378 lb). Body mass index is 61.04 kg/(m^2). Lab Results:   No results found for this or any previous visit (from the past 72 hour(s)).  Physical Findings: AIMS: Facial and Oral Movements Muscles of Facial Expression: None, normal Lips and Perioral Area: None, normal Jaw: None, normal Tongue: None, normal,Extremity Movements Upper (arms, wrists, hands, fingers): None, normal Lower (legs, knees, ankles, toes): None, normal, Trunk Movements Neck, shoulders, hips: None, normal, Overall Severity Severity of abnormal movements (highest score from questions above): None, normal Incapacitation due to abnormal movements: None, normal Patient's  awareness of abnormal movements (rate only patient's report): No Awareness, Dental Status Current problems with teeth and/or dentures?: No Does patient usually wear dentures?: No  CIWA:  CIWA-Ar Total: 1 COWS:   COWS Total Score: 1  Psychiatric Specialty Exam: See Psychiatric Specialty Exam and Suicide Risk Assessment completed by Attending Physician prior to discharge.  Discharge destination:  Home  Is patient on multiple antipsychotic therapies at discharge:  No   Has Patient had three or more failed trials of antipsychotic monotherapy by history:  No  Recommended Plan for Multiple Antipsychotic Therapies: NA     Medication List    STOP taking these medications       AMITRIPTYLINE HCL PO      TAKE these medications     Indication   cyclobenzaprine 10 MG tablet  Commonly known as:  FLEXERIL  Take 1 tablet (10 mg total) by mouth 3 (three) times daily as needed for muscle spasms.   Indication:  Muscle Spasm     DULoxetine 30 MG capsule  Commonly known as:  CYMBALTA  Take 1 capsule (30 mg total) by mouth daily.   Indication:  mood stabilization     ferrous sulfate 325 (65 FE) MG tablet  Take 1 tablet (325 mg total) by mouth 2 (two) times daily with a meal.   Indication:  vitamin deficiency     gabapentin 300 MG capsule  Commonly known as:  NEURONTIN  Take 1 capsule (300 mg total) by mouth 3 (three) times daily.   Indication:  neuropathic pain     hydrochlorothiazide 25 MG tablet  Commonly known as:  HYDRODIURIL  Take 1 tablet (25 mg total) by mouth daily.   Indication:  hypertension     traZODone 100 MG tablet  Commonly known as:  DESYREL  Take 2 tablets (200 mg total) by mouth at bedtime and may repeat dose one time if needed.   Indication:  sleep issues           Follow-up Information   Follow up with [facility] [place] On 01/17/2013. (Thursday, January 17, 2013 at [facility])    Contact information:   [address], [place] [address] Phone: [phone]       Follow-up recommendations:  Activity:  As tolerated Diet:  Heart healthy with low sodium  Comments:   Take all medications as prescribed. Keep all follow-up  appointments as scheduled.  Do not consume alcohol or use illegal drugs while on prescription medications. Report any adverse effects from your medications to your primary care provider promptly.  In the event of recurrent symptoms or worsening symptoms, call 911, a crisis hotline, or go to the nearest emergency department for evaluation.     Total Discharge Time:  Greater than 30 minutes.  Signed: Beau Fanny, FNP-BC 01/07/2013, 3:13 PM  Patient was seen for psychiatric evaluation, suicide risk assessment and case discussed with a physician extender. Formulated treatment plan and later patient showed improvement and then case discussed with the treatment team and made disposition plan. Reviewed the information documented by physician extender and agree with the treatment plan.  Camaron Cammack,JANARDHAHA R. 01/07/2013 5:01 PM

## 2013-01-07 NOTE — Tx Team (Signed)
Interdisciplinary Treatment Plan Update   Date Reviewed:  01/07/2013  Time Reviewed:  9:37 AM  Progress in Treatment:   Attending groups: Yes Participating in groups: Yes Taking medication as prescribed: Yes  Tolerating medication: Yes Family/Significant other contact made: Yes, contact made with family Patient understands diagnosis: Yes  Discussing patient identified problems/goals with staff: Yes Medical problems stabilized or resolved: Yes Denies suicidal/homicidal ideation: Yes Patient has not harmed self or others: Yes  For review of initial/current patient goals, please see plan of care.  Estimated Length of Stay:  discharge today  Reasons for Continued Hospitalization:   New Problems/Goals identified:    Discharge Plan or Barriers:   Home [facility]  Additional Comments: N/A  Attendees:  Patient:  01/07/2013 9:37 AM   Signature: Mervyn GayJ. Jonnalagadda, MD  01/07/2013 9:37 AM  Signature: Claudette Headonrad Withrow, NP 01/07/2013 9:37 AM  Signature: Neill Loftarol Davis, RN 01/07/2013 9:37 AM  Signature:  Quintella ReichertBeverly Knight, RN 01/07/2013 9:37 AM  Signature:  Manuela SchwartzJennifer Pritchett, RN 01/07/2013 9:37 AM  Signature:  Juline PatchQuylle Yaritza Leist, LCSW 01/07/2013 9:37 AM  Signature:  Reyes Ivanhelsea Horton, LCSW 01/07/2013 9:37 AM  Signature:   01/07/2013 9:37 AM  Signature:   01/07/2013 9:37 AM  Signature: Rosalee KaufmanValarie Enoch, Care Coordinator 01/07/2013  9:37 AM  Signature:   01/07/2013  9:37 AM  Signature:   01/07/2013  9:37 AM    Scribe for Treatment Team:   Juline PatchQuylle Anaka Beazer,  01/07/2013 9:37 AM

## 2013-01-07 NOTE — [facility] Group Notes (Signed)
[facility]   01/07/2013 10:24 AM    Participation Quality:  Appropraite  Mood/Affect:  Appropriate  Depression Rating:  3  Anxiety Rating:  3  Thoughts of Suicide:  No  Will you contract for safety?   NA  Current AVH:  No  Plan for Discharge/Comments:  Patient attended discharge planning group and actively participated in group.  He reports doing well today and ready to discharge home.  [facility] for medication management and counseling.  CSW provided all participants with daily workbook.   Transportation Means: Patient has transportation.   Supports:  Patient has a support system.   Toni Demo, Joesph JulyQuylle Hairston

## 2013-01-09 NOTE — ED Provider Notes (Signed)
Medical screening examination/treatment/procedure(s) were performed by non-physician practitioner and as supervising physician I was immediately available for consultation/collaboration.   Nelia Shiobert L Rage Beever, MD 01/09/13 2222

## 2013-01-11 NOTE — Progress Notes (Signed)
Patient Discharge Instructions:  After Visit Summary (AVS):   Faxed to:  01/11/13 Discharge Summary Note:   Faxed to:  01/11/13 Psychiatric Admission Assessment Note:   Faxed to:  01/11/13 Suicide Risk Assessment - Discharge Assessment:   Faxed to:  01/11/13 Faxed/Sent to the Next Level Care provider:  01/11/13 [facility] @ [phone]  Jerelene ReddenSheena E [place], 01/11/2013, 12:12 PM

## 2013-07-01 ENCOUNTER — Encounter (HOSPITAL_COMMUNITY): Payer: Self-pay | Admitting: Emergency Medicine

## 2013-07-01 ENCOUNTER — Inpatient Hospital Stay (HOSPITAL_COMMUNITY)
Admission: EM | Admit: 2013-07-01 | Discharge: 2013-07-08 | DRG: 885 | Disposition: A | Discharge status: Home / Self Care | Payer: Federal, State, Local not specified - Other | Source: Intra-hospital | Attending: Psychiatry | Admitting: Psychiatry

## 2013-07-01 ENCOUNTER — Ambulatory Visit (HOSPITAL_COMMUNITY)
Admission: RE | Admit: 2013-07-01 | Discharge: 2013-07-01 | Disposition: A | Discharge status: Home / Self Care | Payer: Federal, State, Local not specified - Other | Source: Home / Self Care | Attending: Psychiatry | Admitting: Psychiatry

## 2013-07-01 ENCOUNTER — Emergency Department (HOSPITAL_COMMUNITY)
Admission: EM | Admit: 2013-07-01 | Discharge: 2013-07-01 | Disposition: A | Discharge status: Other Acute Inpatient Hospital | Payer: Self-pay | Attending: Emergency Medicine | Admitting: Emergency Medicine

## 2013-07-01 ENCOUNTER — Encounter (HOSPITAL_COMMUNITY): Payer: Self-pay

## 2013-07-01 ENCOUNTER — Emergency Department (HOSPITAL_COMMUNITY): Payer: Self-pay

## 2013-07-01 DIAGNOSIS — G8929 Other chronic pain: Secondary | ICD-10-CM | POA: Diagnosis present

## 2013-07-01 DIAGNOSIS — G43909 Migraine, unspecified, not intractable, without status migrainosus: Secondary | ICD-10-CM | POA: Diagnosis present

## 2013-07-01 DIAGNOSIS — F332 Major depressive disorder, recurrent severe without psychotic features: Secondary | ICD-10-CM | POA: Insufficient documentation

## 2013-07-01 DIAGNOSIS — F3130 Bipolar disorder, current episode depressed, mild or moderate severity, unspecified: Secondary | ICD-10-CM | POA: Insufficient documentation

## 2013-07-01 DIAGNOSIS — S6000XA Contusion of unspecified finger without damage to nail, initial encounter: Secondary | ICD-10-CM | POA: Insufficient documentation

## 2013-07-01 DIAGNOSIS — IMO0001 Reserved for inherently not codable concepts without codable children: Secondary | ICD-10-CM | POA: Diagnosis present

## 2013-07-01 DIAGNOSIS — Z598 Other problems related to housing and economic circumstances: Secondary | ICD-10-CM

## 2013-07-01 DIAGNOSIS — F4310 Post-traumatic stress disorder, unspecified: Secondary | ICD-10-CM | POA: Diagnosis present

## 2013-07-01 DIAGNOSIS — F411 Generalized anxiety disorder: Secondary | ICD-10-CM | POA: Diagnosis present

## 2013-07-01 DIAGNOSIS — E669 Obesity, unspecified: Secondary | ICD-10-CM | POA: Insufficient documentation

## 2013-07-01 DIAGNOSIS — Z79899 Other long term (current) drug therapy: Secondary | ICD-10-CM | POA: Insufficient documentation

## 2013-07-01 DIAGNOSIS — F3181 Bipolar II disorder: Secondary | ICD-10-CM | POA: Diagnosis present

## 2013-07-01 DIAGNOSIS — R45851 Suicidal ideations: Secondary | ICD-10-CM

## 2013-07-01 DIAGNOSIS — M129 Arthropathy, unspecified: Secondary | ICD-10-CM | POA: Insufficient documentation

## 2013-07-01 DIAGNOSIS — Z91199 Patient's noncompliance with other medical treatment and regimen due to unspecified reason: Secondary | ICD-10-CM

## 2013-07-01 DIAGNOSIS — G4700 Insomnia, unspecified: Secondary | ICD-10-CM | POA: Insufficient documentation

## 2013-07-01 DIAGNOSIS — F3189 Other bipolar disorder: Secondary | ICD-10-CM | POA: Diagnosis present

## 2013-07-01 DIAGNOSIS — Z791 Long term (current) use of non-steroidal anti-inflammatories (NSAID): Secondary | ICD-10-CM | POA: Insufficient documentation

## 2013-07-01 DIAGNOSIS — F410 Panic disorder [episodic paroxysmal anxiety] without agoraphobia: Secondary | ICD-10-CM | POA: Diagnosis present

## 2013-07-01 DIAGNOSIS — W2209XA Striking against other stationary object, initial encounter: Secondary | ICD-10-CM | POA: Insufficient documentation

## 2013-07-01 DIAGNOSIS — Z5987 Material hardship due to limited financial resources, not elsewhere classified: Secondary | ICD-10-CM

## 2013-07-01 DIAGNOSIS — S51809A Unspecified open wound of unspecified forearm, initial encounter: Secondary | ICD-10-CM | POA: Insufficient documentation

## 2013-07-01 DIAGNOSIS — Y939 Activity, unspecified: Secondary | ICD-10-CM | POA: Insufficient documentation

## 2013-07-01 DIAGNOSIS — F314 Bipolar disorder, current episode depressed, severe, without psychotic features: Secondary | ICD-10-CM

## 2013-07-01 DIAGNOSIS — Z9119 Patient's noncompliance with other medical treatment and regimen: Secondary | ICD-10-CM | POA: Diagnosis not present

## 2013-07-01 DIAGNOSIS — Z23 Encounter for immunization: Secondary | ICD-10-CM | POA: Insufficient documentation

## 2013-07-01 DIAGNOSIS — Z87442 Personal history of urinary calculi: Secondary | ICD-10-CM

## 2013-07-01 DIAGNOSIS — M20011 Mallet finger of right finger(s): Secondary | ICD-10-CM

## 2013-07-01 DIAGNOSIS — X789XXA Intentional self-harm by unspecified sharp object, initial encounter: Secondary | ICD-10-CM | POA: Insufficient documentation

## 2013-07-01 DIAGNOSIS — Z5989 Other problems related to housing and economic circumstances: Secondary | ICD-10-CM | POA: Diagnosis not present

## 2013-07-01 DIAGNOSIS — Y929 Unspecified place or not applicable: Secondary | ICD-10-CM | POA: Insufficient documentation

## 2013-07-01 DIAGNOSIS — M20019 Mallet finger of unspecified finger(s): Secondary | ICD-10-CM | POA: Insufficient documentation

## 2013-07-01 HISTORY — DX: Post-traumatic stress disorder, unspecified: F43.10

## 2013-07-01 LAB — CBC
HCT: 36.9 % — ABNORMAL LOW (ref 39.0–52.0)
Hemoglobin: 11.1 g/dL — ABNORMAL LOW (ref 13.0–17.0)
MCH: 22.7 pg — AB (ref 26.0–34.0)
MCHC: 30.1 g/dL (ref 30.0–36.0)
MCV: 75.5 fL — ABNORMAL LOW (ref 78.0–100.0)
PLATELETS: 122 10*3/uL — AB (ref 150–400)
RBC: 4.89 MIL/uL (ref 4.22–5.81)
RDW: 16.1 % — AB (ref 11.5–15.5)
WBC: 3.3 10*3/uL — AB (ref 4.0–10.5)

## 2013-07-01 LAB — COMPREHENSIVE METABOLIC PANEL
ALBUMIN: 3.6 g/dL (ref 3.5–5.2)
ALT: 16 U/L (ref 0–53)
AST: 17 U/L (ref 0–37)
Alkaline Phosphatase: 259 U/L — ABNORMAL HIGH (ref 39–117)
BUN: 13 mg/dL (ref 6–23)
CALCIUM: 8.5 mg/dL (ref 8.4–10.5)
CHLORIDE: 110 meq/L (ref 96–112)
CO2: 22 meq/L (ref 19–32)
Creatinine, Ser: 1.15 mg/dL (ref 0.50–1.35)
GFR calc Af Amer: 89 mL/min — ABNORMAL LOW (ref >90)
GFR, EST NON AFRICAN AMERICAN: 76 mL/min — AB (ref >90)
Glucose, Bld: 105 mg/dL — ABNORMAL HIGH (ref 70–99)
Potassium: 4.6 mEq/L (ref 3.7–5.3)
SODIUM: 143 meq/L (ref 137–147)
Total Bilirubin: 0.4 mg/dL (ref 0.3–1.2)
Total Protein: 7.2 g/dL (ref 6.0–8.3)

## 2013-07-01 LAB — RAPID URINE DRUG SCREEN, HOSP PERFORMED
AMPHETAMINES: NOT DETECTED
Barbiturates: NOT DETECTED
Benzodiazepines: NOT DETECTED
Cocaine: NOT DETECTED
Opiates: NOT DETECTED
TETRAHYDROCANNABINOL: NOT DETECTED

## 2013-07-01 LAB — ACETAMINOPHEN LEVEL: Acetaminophen (Tylenol), Serum: <15 ug/mL (ref 10–30)

## 2013-07-01 LAB — ETHANOL: Alcohol, Ethyl (B): <11 mg/dL (ref 0–11)

## 2013-07-01 LAB — SALICYLATE LEVEL: Salicylate Lvl: <2 mg/dL — ABNORMAL LOW (ref 2.8–20.0)

## 2013-07-01 MED ORDER — HYDROXYZINE HCL 25 MG PO TABS
25.0000 mg | ORAL_TABLET | Freq: Four times a day (QID) | ORAL | Status: DC | PRN
  Administered 2013-07-03 – 2013-07-05 (×3): 25 mg via ORAL
  Filled 2013-07-01 (×3): qty 1
  Filled 2013-07-01: qty 20

## 2013-07-01 MED ORDER — FERROUS SULFATE 325 (65 FE) MG PO TABS
325.0000 mg | ORAL_TABLET | Freq: Every day | ORAL | Status: DC
  Administered 2013-07-02 – 2013-07-08 (×7): 325 mg via ORAL
  Filled 2013-07-01 (×10): qty 1

## 2013-07-01 MED ORDER — MAGNESIUM HYDROXIDE 400 MG/5ML PO SUSP: 30.0000 mL | Freq: Every day | ORAL | Status: DC | PRN

## 2013-07-01 MED ORDER — LORAZEPAM 1 MG PO TABS: 1.0000 mg | ORAL_TABLET | Freq: Three times a day (TID) | ORAL | Status: DC | PRN

## 2013-07-01 MED ORDER — DICLOFENAC SODIUM 75 MG PO TBEC
75.0000 mg | DELAYED_RELEASE_TABLET | Freq: Two times a day (BID) | ORAL | Status: DC
  Administered 2013-07-02 – 2013-07-08 (×13): 75 mg via ORAL
  Filled 2013-07-01 (×18): qty 1

## 2013-07-01 MED ORDER — ALUM & MAG HYDROXIDE-SIMETH 200-200-20 MG/5ML PO SUSP: 30.0000 mL | ORAL | Status: DC | PRN

## 2013-07-01 MED ORDER — LEVETIRACETAM 750 MG PO TABS: 750.0000 mg | ORAL_TABLET | Freq: Three times a day (TID) | ORAL | Status: DC

## 2013-07-01 MED ORDER — TETANUS-DIPHTH-ACELL PERTUSSIS 5-2.5-18.5 LF-MCG/0.5 IM SUSP
0.5000 mL | Freq: Once | INTRAMUSCULAR | Status: AC
  Administered 2013-07-01: 0.5 mL via INTRAMUSCULAR
  Filled 2013-07-01: qty 0.5

## 2013-07-01 MED ORDER — OLANZAPINE 10 MG PO TABS: 10.0000 mg | ORAL_TABLET | Freq: Every day | ORAL | Status: DC

## 2013-07-01 MED ORDER — IBUPROFEN 200 MG PO TABS: 600.0000 mg | ORAL_TABLET | Freq: Three times a day (TID) | ORAL | Status: DC | PRN

## 2013-07-01 MED ORDER — ONDANSETRON HCL 4 MG PO TABS: 4.0000 mg | ORAL_TABLET | Freq: Three times a day (TID) | ORAL | Status: DC | PRN

## 2013-07-01 MED ORDER — HYDROCHLOROTHIAZIDE 50 MG PO TABS
50.0000 mg | ORAL_TABLET | Freq: Every morning | ORAL | Status: DC
  Administered 2013-07-02 – 2013-07-05 (×3): 50 mg via ORAL
  Filled 2013-07-01: qty 2
  Filled 2013-07-01 (×5): qty 1
  Filled 2013-07-01: qty 2
  Filled 2013-07-01 (×3): qty 1
  Filled 2013-07-01: qty 2
  Filled 2013-07-01: qty 1

## 2013-07-01 MED ORDER — PROPRANOLOL HCL 20 MG PO TABS: 20.0000 mg | ORAL_TABLET | Freq: Two times a day (BID) | ORAL | Status: DC

## 2013-07-01 MED ORDER — ZOLPIDEM TARTRATE 5 MG PO TABS: 5.0000 mg | ORAL_TABLET | Freq: Every evening | ORAL | Status: DC | PRN

## 2013-07-01 MED ORDER — AMITRIPTYLINE HCL 50 MG PO TABS: 50.0000 mg | ORAL_TABLET | Freq: Every day | ORAL | Status: DC

## 2013-07-01 MED ORDER — LISINOPRIL 10 MG PO TABS
10.0000 mg | ORAL_TABLET | Freq: Every morning | ORAL | Status: DC
  Administered 2013-07-02 – 2013-07-05 (×4): 10 mg via ORAL
  Filled 2013-07-01 (×7): qty 1
  Filled 2013-07-01: qty 2
  Filled 2013-07-01 (×2): qty 1

## 2013-07-01 MED ORDER — TOPIRAMATE 25 MG PO TABS: 50.0000 mg | ORAL_TABLET | Freq: Two times a day (BID) | ORAL | Status: DC

## 2013-07-01 MED ORDER — ACETAMINOPHEN 325 MG PO TABS
650.0000 mg | ORAL_TABLET | Freq: Four times a day (QID) | ORAL | Status: DC | PRN
  Administered 2013-07-06 – 2013-07-07 (×2): 650 mg via ORAL
  Filled 2013-07-01 (×2): qty 2

## 2013-07-01 MED ORDER — GABAPENTIN 100 MG PO CAPS: 100.0000 mg | ORAL_CAPSULE | Freq: Three times a day (TID) | ORAL | Status: DC

## 2013-07-01 MED ORDER — AMITRIPTYLINE HCL 100 MG PO TABS
100.0000 mg | ORAL_TABLET | Freq: Every day | ORAL | Status: DC
  Administered 2013-07-01 – 2013-07-07 (×7): 100 mg via ORAL
  Filled 2013-07-01: qty 4
  Filled 2013-07-01 (×6): qty 1
  Filled 2013-07-01: qty 7
  Filled 2013-07-01 (×2): qty 1
  Filled 2013-07-01: qty 2

## 2013-07-01 MED ORDER — AMITRIPTYLINE HCL 25 MG PO TABS
ORAL_TABLET | ORAL | Status: AC
  Filled 2013-07-01: qty 2

## 2013-07-01 NOTE — Progress Notes (Signed)
  CARE MANAGEMENT ED NOTE 07/01/2013  Patient:  Zachary [name],Zachary [name]   Account Number:  [id number]  Date Initiated:  07/01/2013  Documentation initiated by:  Radford PaxFERRERO,AMY  Subjective/Objective Assessment:   Patient presents to ED with depression, anxiety and panic attacks who cut his wrists today with a razor blade.     Subjective/Objective Assessment Detail:     Action/Plan:   [facility] be medically cleared and [facility]   Action/Plan Detail:   Anticipated DC Date:       Status Recommendation to Physician:   Result of Recommendation:    Other ED Services  Consult Working Plan    DC Planning Services  Other  PCP issues    Choice offered to / List presented to:            Status of service:  Completed, signed off  ED Comments:   ED Comments Detail:  EDCM spoke to patient at bedside.  Patient reports that he does not have a pcp but [facility] Patient reports Dr. Hassell DoneAnthony [name] with Family services helps him with his medications, psychiateic issues and medical issues.  Patient reports he had an appointment today but he forgot.  Patient also reports he [name].  Patient believes Zachary Failnita is a Veterinary surgeoncounselor, cannot remember her last name.  Patient reports he is receiving his medications for free through Family services.  [facility]. EDCM explained to patient that walkins are welcome from 9am -1030 am Mon -Thurs.  EDCM also informed patient that he may speak to a Child psychotherapistsocial worker, Artistfinancial counselor, establish care, recieve assist with medications and enroll in orange card program if needed.  Patient thankful for resources.  No further EDCM needs at this time.

## 2013-07-01 NOTE — ED Notes (Signed)
[facility]

## 2013-07-01 NOTE — Progress Notes (Signed)
Per Renata Capriceonrad, patient meets criteria for inpatient hospitalization.  Writer informed the Charge Nurse Rushie Goltz(Faith Hilda LiasMarie) that [facility] and [facility].  Per [facility] Inetta Fermoina we do not have any [address] beds at this time.  TTS will refer the patient to other facilities.   Writer informed the Personal assistantTTS Counselor at [facility] Jessie Foot([place]).

## 2013-07-01 NOTE — Discharge Instructions (Signed)
Mallet Finger A mallet, or jammed, finger occurs when the end of a straightened finger or thumb receives a blow (often from a ball). This causes a disruption (tearing) of the extensor tendon (cord like structure which attaches muscle to bone) that straightens the end of your finger. The last joint in your finger will droop and you cannot extend it. Sometimes this is associated with a small fracture (break in bone) of the base of the end bone (phalange) in your finger. It usually takes 4 to 5 weeks to heal. HOME CARE INSTRUCTIONS   Apply ice to the sore finger for 15-20 minutes, 03-04 times per day for 2 days. Put the ice in a plastic bag and place a towel between the bag of ice and your skin.  If you have a finger splint, wear your splint as directed.  You may remove the splint to wash your finger or as directed.  If your splint is off, do not try to bend the tip of your finger.  Put your splint back on as soon as possible. If your finger is numb or tingling, the splint is probably too tight. You can loosen it so it is comfortable.  Move the part of your injured finger that is not covered by the splint several times a day.  Take medications as directed by your caregiver. Only take over-the-counter or prescription medicines for pain, discomfort, or fever as directed by your caregiver.  IMPORTANT: follow up with your caregiver or keep or call for any appointments with specialists as directed. The failure to follow up could result in chronic pain and / or disability. SEEK MEDICAL CARE IF:   You have increased pain or swelling.  You notice coldness of your finger.  After treatment you still cannot extend your finger. SEEK IMMEDIATE MEDICAL CARE IF:  Your finger is swollen and very red, white, blue, numb, cold, or tingling. MAKE SURE YOU:   Understand these instructions.  Will watch your condition.  Will get help right away if you are not doing well or get worse. Document Released:  12/18/1999 Document Revised: 03/14/2011 Document Reviewed: 08/03/2007 [facility] Patient Information 2015 [employer], [place]. This information is not intended to replace advice given to you by your health care provider. Make sure you discuss any questions you have with your health care provider.  Suicidal Feelings, How to Help Yourself Everyone feels sad or unhappy at times, but depressing thoughts and feelings of hopelessness can lead to thoughts of suicide. It can seem as if life is too tough to handle. If you feel as though you have reached the point where suicide is the only answer, it is time to let someone know immediately.  HOW TO COPE AND PREVENT SUICIDE  Let family, friends, teachers, or counselors know. Get help. Try not to isolate yourself from those who care about you. Even though you may not feel sociable, talk with someone every day. It is best if it is face-to-face. Remember, they will want to help you.  Eat a regularly spaced and well-balanced diet.  Get plenty of rest.  Avoid alcohol and drugs because they will only make you feel worse and may also lower your inhibitions. Remove them from the home. If you are thinking of taking an overdose of your prescribed medicines, give your medicines to someone who can give them to you one day at a time. If you are on antidepressants, let your caregiver know of your feelings so he or she can provide a safer  medicine, if that is a concern.  Remove weapons or poisons from your home.  Try to stick to routines. Follow a schedule and remind yourself that you have to keep that schedule every day.  Set some realistic goals and achieve them. Make a list and cross things off as you go. Accomplishments give a sense of worth. Wait until you are feeling better before doing things you find difficult or unpleasant to do.  If you are able, try to start exercising. Even half-hour periods of exercise each day will make you feel better. Getting out in the sun or  into nature helps you recover from depression faster. If you have a favorite place to walk, take advantage of that.  Increase safe activities that have always given you pleasure. This may include playing your favorite music, reading a good book, painting a picture, or playing your favorite instrument. Do whatever takes your mind off your depression.  Keep your living space well-lighted. GET HELP Contact a suicide hotline, crisis center, or local suicide prevention center for help right away. Local centers may include a hospital, clinic, community service organization, social service provider, or health department.  Call your local emergency services (911 in the [place]).  Call a suicide hotline:  1-800-273-TALK ((707) 041-44821-714-861-5717) in the [place].  1-800-SUICIDE 931-305-2859(1-9408450479) in the [place].  775-563-41331-779-443-3560 in the [place] for Spanish-speaking counselors.  4-132-440-1UUV1-800-799-4TTY 7092366991(1-775-754-1725) in the [place] for TTY users.  Visit the following websites for information and help:  National Suicide Prevention Lifeline: www.suicidepreventionlifeline.org  Hopeline: www.hopeline.com  [employer] Foundation for Suicide Prevention: https://www.ayers.com/www.afsp.org  For lesbian, gay, bisexual, transgender, or questioning youth, contact The [employer] Project:  4-259-5-G-LOVFIE1-866-4-U-TREVOR (618)474-3935(1-810-625-2679) in the [place].  www.thetrevorproject.org  In [place], treatment resources are listed in each province with listings available under [employer] for [employer]Health Services or similar titles. Another source for Crisis Centres by [place] is located at http://www.suicideprevention.ca/in-crisis-now/find-a-crisis-centre-now/crisis-centres Document Released: 06/26/2002 Document Revised: 03/14/2011 Document Reviewed: 11/14/2006 [facility] Patient Information 2015 [place]Care, [place]LLC. This information is not intended to replace advice given to you by your health care provider. Make sure you discuss any questions you  have with your health care provider.

## 2013-07-01 NOTE — ED Notes (Signed)
[facility] called for transportation.

## 2013-07-01 NOTE — BHH Counselor (Signed)
[facility]  Dr. Jama Flavorsobos attending, [phone].
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
## 2013-07-01 NOTE — ED Provider Notes (Signed)
CSN: [id number]     Arrival date & time 07/01/13  1732 History   First MD Initiated Contact with Patient 07/01/13 1926     Chief Complaint  Patient presents with  . Medical Clearance     (Consider location/radiation/quality/duration/timing/severity/associated sxs/prior Treatment) HPI Comments: Pt also states he stubbed his R ring finger on the toilet handle and now be cant extend the tip of his finger.   Patient is a 43 y.o. male presenting with mental health disorder. The history is provided by the patient. No language interpreter was used.  Mental Health Problem Presenting symptoms: depression, self mutilation, suicidal thoughts, suicidal threats and suicide attempt   Presenting symptoms: no agitation   Degree of incapacity (severity):  Severe Onset quality:  Gradual Timing:  Constant Progression:  Worsening Chronicity:  Recurrent Context: noncompliance   Treatment compliance:  Some of the time Relieved by:  Nothing Worsened by:  Nothing tried Ineffective treatments:  None tried Associated symptoms: anhedonia, appetite change and insomnia   Associated symptoms: no abdominal pain, no chest pain, no fatigue and no headaches   Associated symptoms comment:  Pt cut his L forearm today with a razor Risk factors: hx of mental illness and hx of suicide attempts     Past Medical History  Diagnosis Date  . Bipolar 1 disorder, depressed   . Arthritis   . Obesity   . Fibromyalgia    Past Surgical History  Procedure Laterality Date  . Gastric bypass    . Cholecystectomy    . Appendectomy    . Tympanostomy tube placement    . Adenoidectomy     No family history on file. History  Substance Use Topics  . Smoking status: Never Smoker   . Smokeless tobacco: Never Used  . Alcohol Use: No     Comment: rarely    Review of Systems  Constitutional: Positive for appetite change. Negative for fever, activity change and fatigue.  HENT: Negative for congestion, facial swelling,  rhinorrhea and trouble swallowing.   Eyes: Negative for photophobia and pain.  Respiratory: Negative for cough, chest tightness and shortness of breath.   Cardiovascular: Negative for chest pain and leg swelling.  Gastrointestinal: Negative for nausea, vomiting, abdominal pain, diarrhea and constipation.  Endocrine: Negative for polydipsia and polyuria.  Genitourinary: Negative for dysuria, urgency, decreased urine volume and difficulty urinating.  Musculoskeletal: Negative for back pain and gait problem.  Skin: Negative for color change, rash and wound.  Allergic/Immunologic: Negative for immunocompromised state.  Neurological: Negative for dizziness, facial asymmetry, speech difficulty, weakness, numbness and headaches.  Psychiatric/Behavioral: Positive for suicidal ideas and self-injury. Negative for confusion, decreased concentration and agitation. The patient has insomnia.       Allergies  Codeine  Home Medications   Prior to Admission medications   Medication Sig Start Date End Date Taking? Authorizing Provider  amitriptyline (ELAVIL) 50 MG tablet Take 50 mg by mouth at bedtime.   Yes Historical Provider, MD  calcium carbonate (TUMS - DOSED IN MG ELEMENTAL CALCIUM) 500 MG chewable tablet Chew 1 tablet by mouth 2 (two) times daily.   Yes Historical Provider, MD  diclofenac (VOLTAREN) 75 MG EC tablet Take 75 mg by mouth 2 (two) times daily.   Yes Historical Provider, MD  gabapentin (NEURONTIN) 100 MG capsule Take 100 mg by mouth 3 (three) times daily.   Yes Historical Provider, MD  hydrochlorothiazide (HYDRODIURIL) 25 MG tablet Take 25 mg by mouth every morning.   Yes Historical Provider, MD  levETIRAcetam (KEPPRA) 750 MG tablet Take by mouth 3 (three) times daily.   Yes Historical Provider, MD  lisinopril (PRINIVIL,ZESTRIL) 10 MG tablet Take 10 mg by mouth every morning.   Yes Historical Provider, MD  OLANZapine (ZYPREXA) 10 MG tablet Take 10 mg by mouth at bedtime.   Yes  Historical Provider, MD  propranolol (INDERAL) 20 MG tablet Take 20 mg by mouth 2 (two) times daily.   Yes Historical Provider, MD  topiramate (TOPAMAX) 50 MG tablet Take 50 mg by mouth 2 (two) times daily.   Yes Historical Provider, MD  ferrous sulfate 325 (65 FE) MG tablet Take 325 mg by mouth daily with breakfast.    Historical Provider, MD   BP 134/87  Pulse 91  Temp(Src) 98.3 F (36.8 C) (Oral)  SpO2 100% Physical Exam  Constitutional: He is oriented to person, place, and time. He appears well-developed and well-nourished. No distress.  HENT:  Head: Normocephalic and atraumatic.  Mouth/Throat: No oropharyngeal exudate.  Eyes: Pupils are equal, round, and reactive to light.  Neck: Normal range of motion. Neck supple.  Cardiovascular: Normal rate, regular rhythm and normal heart sounds.  Exam reveals no gallop and no friction rub.   No murmur heard. Pulmonary/Chest: Effort normal and breath sounds normal. No respiratory distress. He has no wheezes. He has no rales.  Abdominal: Soft. Bowel sounds are normal. He exhibits no distension and no mass. There is no tenderness. There is no rebound and no guarding.  Musculoskeletal: Normal range of motion. He exhibits no edema and no tenderness.       Arms:      Hands: Neurological: He is alert and oriented to person, place, and time. GCS eye subscore is 4. GCS verbal subscore is 5. GCS motor subscore is 6.  Skin: Skin is warm and dry.  Psychiatric: He exhibits a depressed mood.    ED Course  Procedures (including critical care time) Labs Review Labs Reviewed  CBC - Abnormal; Notable for the following:    WBC 3.3 (*)    Hemoglobin 11.1 (*)    HCT 36.9 (*)    MCV 75.5 (*)    MCH 22.7 (*)    RDW 16.1 (*)    Platelets 122 (*)    All other components within normal limits  COMPREHENSIVE METABOLIC PANEL - Abnormal; Notable for the following:    Glucose, Bld 105 (*)    Alkaline Phosphatase 259 (*)    GFR calc non Af Amer 76 (*)    GFR  calc Af Amer 89 (*)    All other components within normal limits  SALICYLATE LEVEL - Abnormal; Notable for the following:    Salicylate Lvl <2.0 (*)    All other components within normal limits  ACETAMINOPHEN LEVEL  ETHANOL  URINE RAPID DRUG SCREEN (HOSP PERFORMED)    Imaging Review Dg Finger Ring Right  07/01/2013   CLINICAL DATA:  Deformity at DIP joint  EXAM: RIGHT RING FINGER 2+V  COMPARISON:  None.  FINDINGS: There is no evidence of fracture or dislocation. There is flexion of the DIP joint. There is no evidence of arthropathy. Soft tissues are unremarkable.  IMPRESSION: No acute fracture or dislocation. Flexion of the DIP joint/mallet finger.   Electronically Signed   By: Sherian ReinWei-Chen  Lin M.D.   On: 07/01/2013 20:15     EKG Interpretation None      MDM   Final diagnoses:  Major depressive disorder, recurrent, severe without psychotic features  Fingertip contusion, initial  encounter  Mallet finger of right hand    Pt is a 43 y.o. male with Pmhx as above who presents with depression, inc anxiety, panic attacks and SI w/ plan to cut his wrists which he has done today with razor (lacerations superfical, not requiring repair). tDap updated.. Pt sent from [facility] for medical clearance where he presented as a walk-in. Denies HI, AVH. On my exam, pt walks out of the bathroom saying he stubbed his finger on the toilet flushing handle and may have dislocated it. Finger flexed at DIP joint. When passively extended, alignment appears nml. XR shows no bony abnormalities. Pt may have mallet finger-type injury. Will treated w/ immobilization. He can f/u with hand surgery as outpt for persistent symptoms.         Shanna Cisco, MD 07/01/13 2047

## 2013-07-02 DIAGNOSIS — F3130 Bipolar disorder, current episode depressed, mild or moderate severity, unspecified: Secondary | ICD-10-CM

## 2013-07-02 DIAGNOSIS — F411 Generalized anxiety disorder: Secondary | ICD-10-CM

## 2013-07-02 DIAGNOSIS — R45851 Suicidal ideations: Secondary | ICD-10-CM

## 2013-07-02 MED ORDER — CITALOPRAM HYDROBROMIDE 10 MG PO TABS
10.0000 mg | ORAL_TABLET | Freq: Every day | ORAL | Status: DC
  Administered 2013-07-02 – 2013-07-05 (×4): 10 mg via ORAL
  Filled 2013-07-02 (×5): qty 1

## 2013-07-02 MED ORDER — LAMOTRIGINE 25 MG PO TABS
25.0000 mg | ORAL_TABLET | Freq: Two times a day (BID) | ORAL | Status: DC
  Administered 2013-07-02 – 2013-07-08 (×12): 25 mg via ORAL
  Filled 2013-07-02 (×8): qty 1
  Filled 2013-07-02: qty 14
  Filled 2013-07-02 (×3): qty 1
  Filled 2013-07-02: qty 14
  Filled 2013-07-02 (×4): qty 1
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
## 2013-07-02 NOTE — Progress Notes (Signed)
Pt is a 43 yr old male admitted for SI. Pt performed superficial cuts to his LFA with a razor. Pt reports having financial difficulties. Pt is currently filing for disability. Pt is also verbalizing family stress. He reports that " everything is hard to deal with" and that he is "losing control". Pt reports multiple suicide attempts in the past. One method includes overdosing. Pt is reporting anxiety, depression, si, and a decrease in his appetite and level of concentration. Pt reports that he has been non-compliant with his medications for the past two months. He verbalizes that his meds were not working. Pt reports the signigicant loss of his brother who died as a result of a suicide. He reports a hx of PTSD as a result of his brother's suicide. Pt admits to on/off SI. Pt verbally contracts for safety. 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

## 2013-07-02 NOTE — Progress Notes (Signed)
Patient ID: Zachary DodrillJames E [name], male   DOB: [birth year], 43 y.o.   MRN: [id number] D  --  Pt. Complains of dis-comfort in his injured finger and of on going headache this evening.  He is friendly, plesant and  Interacts well with peers and staff, spending tme in dayroom reading and watching TV.    Pt. Ambulates safely with a cane due to cartilage issues in bi-lateral knees.  Pt. Ambulates in a slow, but steady fashion  And asks for no assistance from staff.    Pt. Asked  If he had any medication  Approved for  Migraine headaches and stated that he would ask doctor about it tomorrow.   A  --   support and encouragement.   R  --  Pt. Remains safe on unit

## 2013-07-02 NOTE — Progress Notes (Signed)
Recreation Therapy Notes  Animal-Assisted Activity/Therapy (AAA/T) Program Checklist/Progress Notes Patient Eligibility Criteria Checklist & Daily Group note for Rec Tx Intervention  Date: 06.30.2015 Time: 3:15pm Location: [address] Dayroom    AAA/T Program Assumption of Risk Form signed by Patient/ or Parent Legal Guardian yes  Patient is free of allergies or sever asthma yes  Patient reports no fear of animals yes  Patient reports no history of cruelty to animals yes   Patient understands his/her participation is voluntary yes  Behavioral Response: Did not attend.   Denise L Blanchfield, LRT/CTRS  Blanchfield, Denise L 07/02/2013 4:36 PM 

## 2013-07-02 NOTE — Tx Team (Signed)
Interdisciplinary Treatment Plan Update   Date Reviewed:  07/02/2013  Time Reviewed:  8:40 AM  Progress in Treatment:   Attending groups: Yes Participating in groups: Yes Taking medication as prescribed: Yes  Tolerating medication: Yes Family/Significant other contact made:  No, but will ask patient for consent for collateral contact Patient understands diagnosis: Yes  Discussing patient identified problems/goals with staff: Yes Medical problems stabilized or resolved: Yes Denies suicidal/homicidal ideation: Yes Patient has not harmed self or others: Yes  For review of initial/current patient goals, please see plan of care.  Estimated Length of Stay:  3-5 days  Reasons for Continued Hospitalization:  Anxiety Depression Medication stabilization Suicidal ideation  New Problems/Goals identified:    Discharge Plan or Barriers:   Home with outpatient follow up with [facility]  Additional Comments:   Pt is a 43 yr old male admitted for SI. Pt performed superficial cuts to his LFA with a razor. Pt reports having financial difficulties. Pt is currently filing for disability. Pt is also verbalizing family stress. He reports that " everything is hard to deal with" and that he is "losing control". Pt reports multiple suicide attempts in the past. One method includes overdosing. Pt is reporting anxiety, depression, si, and a decrease in his appetite and level of concentration. Pt reports that he has been non-compliant with his medications for the past two months. He verbalizes that his meds were not working. Pt reports the signigicant loss of his brother who died as a result of a suicide. He reports a hx of PTSD as a result of his brother's suicide. Pt admits to on/off SI. Pt verbally contracts for safety   Attendees:  Patient:  07/02/2013 8:40 AM   Signature:  S. Arfeen, MD 07/02/2013 8:40 AM  Signature:  07/02/2013 8:40 AM  Signature:   07/02/2013 8:40 AM  Signature:Beverly Terrilee CroakKnight, RN  07/02/2013 8:40 AM  Signature: Liborio NixonPatrice White, RN  07/02/2013 8:40 AM  Signature:  Juline PatchQuylle Hodnett, LCSW 07/02/2013 8:40 AM  Signature:  Reyes Ivanhelsea Horton, LCSW 07/02/2013 8:40 AM  Signature:  Leisa LenzValerie Enoch, Care Coordinator [facility] 07/02/2013 8:40 AM  Signature:   07/02/2013 8:40 AM  Signature: Leighton ParodyBritney Tyson, RN 07/02/2013  8:40 AM  Signature:  07/02/2013  8:40 AM  Signature: 07/02/2013  8:40 AM    Scribe for Treatment Team:   Juline PatchQuylle Hodnett,  07/02/2013 8:40 AM

## 2013-07-02 NOTE — Tx Team (Signed)
Initial Interdisciplinary Treatment Plan  PATIENT STRENGTHS: (choose at least two) Average or above average intelligence General fund of knowledge Motivation for treatment/growth Supportive family/friends  PATIENT STRESSORS: Financial difficulties Loss of brother by suicide Medication change or noncompliance   PROBLEM LIST: Problem List/Patient Goals Date to be addressed Date deferred Reason deferred Estimated date of resolution  Increased risk for SI 6/30     depression      Anxiety                                           DISCHARGE CRITERIA:  Improved stabilization in mood, thinking, and/or behavior Medical problems require only outpatient monitoring Motivation to continue treatment in a less acute level of care Need for constant or close observation no longer present Reduction of life-threatening or endangering symptoms to within safe limits Verbal commitment to aftercare and medication compliance  PRELIMINARY DISCHARGE PLAN: Outpatient therapy  PATIENT/FAMIILY INVOLVEMENT: This treatment plan has been presented to and reviewed with the patient, Latrelle DodrillJames E Bradburn.  The patient and family have been given the opportunity to ask questions and make suggestions.  INGRAM, CHARITY A 07/02/2013, 5:32 AM

## 2013-07-02 NOTE — Progress Notes (Signed)
Pt presents depressed this morning. Pt irritable on approach. Pt reports depression 9/10 and hopeless 9/10. Pt reports constant suicidal thoughts. Pt verbally contracts for safety at this time. Pt observed attending groups this morning. Pt ambulates with a cane d/t unsteady gait. Pt reports financial difficulties and family issues as current stressors. Medications administered as ordered per MD. Verbal support given. Pt encouraged to attend to groups. 15 minute checks performed for safety. Pt safety maintained at this time.

## 2013-07-02 NOTE — [facility] Group Notes (Signed)
[facility] LCSW Group Therapy      Feelings About Diagnosis 1:15 - 2:30 PM         07/02/2013    Type of Therapy:  Group Therapy  Participation Level:  Active  Participation Quality:  Appropriate  Affect:  Appropriate  Cognitive:  Alert and Appropriate  Insight:  Developing/Improving and Engaged  Engagement in Therapy:  Developing/Improving and Engaged  Modes of Intervention:  Discussion, Education, Exploration, Problem-Solving, Rapport Building, Support  Summary of Progress/Problems:  Patient actively participated in group. Patient discussed past and present diagnosis and the effects it has had on  life.  Patient talked about family and society being judgmental and the stigma associated with having a mental health diagnosis.  Patient shared it is very difficult having a mental health diagnosis and remaining positive.    [name], Zachary [name] 07/02/2013

## 2013-07-02 NOTE — [facility] Suicide Risk Assessment (Signed)
[facility] INPATIENT:  Family/Significant Other Suicide Prevention Education  Suicide Prevention Education:  Education Completed; Zachary [name], Girlfriend, [phone];) has been identified by the patient as the family member/significant other with whom the patient will be residing, and identified as the person(s) who will aid the patient in the event of a mental health crisis (suicidal ideations/suicide attempt).  With written consent from the patient, the family member/significant other has been provided the following suicide prevention education, prior to the and/or following the discharge of the patient.  The suicide prevention education provided includes the following:  Suicide risk factors  Suicide prevention and interventions  National Suicide Hotline telephone number  [facility] assessment telephone number  [facility] 911  [facility] and/or [facility] telephone number  Request made of family/significant other to:  Remove weapons (e.g., guns, rifles, knives), all items previously/currently identified as safety concern.  Girlfriend advised patient does not have access to weapons.   Remove drugs/medications (over-the-counter, prescriptions, illicit drugs), all items previously/currently identified as a safety concern.  The family member/significant other verbalizes understanding of the suicide prevention education information provided.  The family member/significant other agrees to remove the items of safety concern listed above.  Zachary [name], Zachary [name] 07/02/2013, 3:10 PM

## 2013-07-02 NOTE — [facility] Counselor (Signed)
Adult Psychosocial Assessment Update Interdisciplinary Team  [facility] admissions/discharges:  Admissions Discharges  Date: 01/01/13 Date: 01/07/13  Date: Date:  Date: Date:  Date: Date:  Date: Date:   Changes since the last Psychosocial Assessment (including adherence to outpatient mental health and/or substance abuse treatment, situational issues contributing to decompensation and/or relapse). Pt is a 43 yr old male admitted for SI. Pt performed superficial cuts to his LFA with a razor. Pt reports having financial difficulties. Pt is currently filing for disability. Pt is also verbalizing family stress. He reports that " everything is hard to deal with" and that he is "losing control". Pt reports multiple suicide attempts in the past. One method includes overdosing. Pt is reporting anxiety, depression, si, and a decrease in his appetite and level of concentration. Pt reports that he has been non-compliant with his medications for the past two months. He verbalizes that his meds were not working. Pt reports the signigicant loss of his brother who died as a result of a suicide. He reports a hx of PTSD as a result of his brother's suicide. Pt admits to on/off SI. Pt verbally contracts for safety              Discharge Plan 1. Will you be returning to the same living situation after discharge?   Yes: No:      If no, what is your plan?    Yes, patient plans to return to his home.       2. Would you like a referral for services when you are discharged? Yes:     If yes, for what services?  No:       No.   [facility]       Summary and Recommendations (to be completed by the evaluator) Zachary [name] is a 43 years old Caucasian male admitted with Major Depression Disorder and SI. He will benefit from crisis stabilization, evaluation for medication, psycho-education groups for coping skills development, group therapy and case management  for discharge planning.                        Signature:  [name], Quylle Hairston, 07/02/2013 10:37 AM

## 2013-07-02 NOTE — [facility] Suicide Risk Assessment (Signed)
   Nursing information obtained from:  Patient Demographic factors:  Male;Divorced or widowed;Low socioeconomic status;Unemployed Current Mental Status:  Suicidal ideation indicated by patient (on/off) Loss Factors:  Loss of significant relationship;Decline in physical health;Financial problems / change in socioeconomic status Historical Factors:  Prior suicide attempts;Family history of suicide;Family history of mental illness or substance abuse Risk Reduction Factors:  Living with another person, especially a relative;Positive social support Total Time spent with patient: 45 minutes  CLINICAL FACTORS:   Bipolar Disorder:   Depressive phase  Psychiatric Specialty Exam: Physical Exam  ROS  Blood pressure 128/83, pulse 108, temperature 97.3 F (36.3 C), temperature source Oral, resp. rate 20.There is no weight on file to calculate BMI.  General Appearance: Disheveled  Eye SolicitorContact::  Fair  Speech:  Slow  Volume:  Decreased  Mood:  Anxious, Depressed and Dysphoric  Affect:  Congruent  Thought Process:  Coherent and Goal Directed  Orientation:  Full (Time, Place, and Person)  Thought Content:  WDL  Suicidal Thoughts:  Yes.  with intent/plan  Homicidal Thoughts:  No  Memory:  Immediate;   Fair Recent;   Fair Remote;   Fair  Judgement:  Fair  Insight:  Fair  Psychomotor Activity:  Decreased  Concentration:  Fair  Recall:  [employer]Fair  Fund of Knowledge:Fair  Language: Fair  Akathisia:  No  Handed:  Right  AIMS (if indicated):     Assets:  Communication Skills Desire for Improvement Resilience  Sleep:      Musculoskeletal: Strength & Muscle Tone: within normal limits Gait & Station: normal Patient leans: N/A  COGNITIVE FEATURES THAT CONTRIBUTE TO RISK:  Closed-mindedness Loss of executive function    SUICIDE RISK:   Severe:  Frequent, intense, and enduring suicidal ideation, specific plan, no subjective intent, but some objective markers of intent (i.e., choice of lethal  method), the method is accessible, some limited preparatory behavior, evidence of impaired self-control, severe dysphoria/symptomatology, multiple risk factors present, and few if any protective factors, particularly a lack of social support.  PLAN OF CARE:  I certify that inpatient services furnished can reasonably be expected to improve the patient's condition.  Lyndsy Gilberto T. 07/02/2013, 2:41 PM

## 2013-07-03 MED ORDER — TOPIRAMATE 25 MG PO TABS
50.0000 mg | ORAL_TABLET | Freq: Two times a day (BID) | ORAL | Status: DC
  Administered 2013-07-03 – 2013-07-04 (×3): 50 mg via ORAL
  Filled 2013-07-03 (×8): qty 2

## 2013-07-03 NOTE — Progress Notes (Signed)
Adult Psychoeducational Group Note  Date:  07/03/2013 Time:  10:42 PM  Group Topic/Focus:  Wrap-Up Group:   The focus of this group is to help patients review their daily goal of treatment and discuss progress on daily workbooks.  Participation Level:  Active  Participation Quality:  Appropriate  Affect:  Appropriate  Cognitive:  Appropriate  Insight: Appropriate  Engagement in Group:  Engaged  Modes of Intervention:  Discussion  Additional Comments:  The patient expressed that he learn in group that it is ok to help people.The patient also said that you diont always have to put people ahead of yourself.  Octavio Mannshigpen, Ronit Marczak Lee 07/03/2013, 10:42 PM

## 2013-07-03 NOTE — [facility] Group Notes (Signed)
[facility] LCSW Group Therapy  Emotional Regulation 1:15 - 2: 30 PM        07/03/2013  3:03 PM    Type of Therapy:  Group Therapy  Participation Level:  Minimal  Participation Quality: Minimal  Affect:  Appropriate  Cognitive:  Appropriate  Insight:  Developing/Improving   Engagement in Therapy:  Developing/Improving   Modes of Intervention:  Discussion Exploration Problem-Solving Supportive  Summary of Progress/Problems:  Group topic was emotional regulations.  Patient was late entering group and did not participate in the discussion. Zachary [name], Zachary [name] 07/03/2013 3:03 PM

## 2013-07-03 NOTE — Progress Notes (Signed)
[facility] Group Notes:  (Nursing/MHT/Case Management/Adjunct)  Date:  07/03/2013  Time:  2:25 AM  Type of Therapy:  Group Therapy  Participation Level:  Active  Participation Quality:  Appropriate  Affect:  Appropriate  Cognitive:  Appropriate  Insight:  Appropriate  Engagement in Group:  Engaged  Modes of Intervention:  Discussion  Summary of Progress/Problems: The patient says that he had an okay day. The positive thing that happened is that his girlfriend surprised him with a visit at mealtime.  [name],TAWANA 07/03/2013, 2:25 AM

## 2013-07-03 NOTE — Progress Notes (Signed)
Pt presents with flat affect and depressed mood. Pt reports passive suicidal thoughts this morning. Pt contracts for safety. Pt reports increased depression.  Pt c/o having a migraine this morning. MD made aware and pt started on Topamax 50 mg BID. Pt compliant with taking meds this morning and attending groups. Pt requested to rest in bed d/t migraine. Staff made aware of pt request to rest. Medications administered as ordered per MD. Verbal support given. Pt encouraged to attend groups. 15 minute checks performed for safety. Pt safety maintained at this time.

## 2013-07-03 NOTE — Progress Notes (Signed)
[facility] MD Progress Note  07/03/2013 2:01 PM Zachary [name]  MRN:  [id number]  Subjective:   I have a terrible headache.  I cannot sleep.  Objective The patient is a 43 year old Caucasian man who was admitted because of severe depression and having suicidal thoughts.  He cut himself with a razor superficially.  Patient reported he's been very frustrated with his provider [facility] [place].  Patient mentioned that he had tried multiple times to change his medication but it was not depressed.  He likes to go for counseling but he is very disappointed about his medication.  Patient continued to endorse suicidal thoughts, hopeless, and worthless feeling.  He is very vague about his future.  He endorsed crying spells and withdrawn.  Patient was started on Lamictal and Celexa.  He did not participate in the group because of headaches.  He denies any hallucination.  At this time he does not report any side effects of medication.   Diagnosis:   DSM5: Total Time spent with patient: 20 minutes  Axis I: Bipolar, Depressed Axis II: Deferred Axis III:  Past Medical History  Diagnosis Date  . Obesity   . Bipolar 1 disorder, depressed   . PTSD (post-traumatic stress disorder)   . Arthritis   . Fibromyalgia    Axis IV: economic problems, other psychosocial or environmental problems, problems related to social environment and problems with primary support group  ADL's:  Impaired  Sleep: Poor  Appetite:  Fair  Psychiatric Specialty Exam: Physical Exam  ROS  Blood pressure 106/71, pulse 90, temperature 97.4 F (36.3 C), temperature source Oral, resp. rate 20.There is no weight on file to calculate BMI.  General Appearance: Casual and Disheveled  Eye Contact::  Fair  Speech:  Slow  Volume:  Decreased  Mood:  Depressed, Dysphoric, Hopeless and Irritable  Affect:  Constricted and Depressed  Thought Process:  Goal Directed  Orientation:  Full (Time, Place, and Person)  Thought  Content:  WDL  Suicidal Thoughts:  Yes.  with intent/plan  Homicidal Thoughts:  No  Memory:  Immediate;   Fair Recent;   Fair Remote;   Fair  Judgement:  Fair  Insight:  Fair  Psychomotor Activity:  Decreased  Concentration:  Fair  Recall:  [employer] of Knowledge:Fair  Language: Fair  Akathisia:  No  Handed:  Right  AIMS (if indicated):     Assets:  Communication Skills  Sleep:  Number of Hours: 6.5   Musculoskeletal: Strength & Muscle Tone: within normal limits Gait & Station: normal Patient leans: N/A  Current Medications: Current Facility-Administered Medications  Medication Dose Route Frequency Provider Last Rate Last Dose  . acetaminophen (TYLENOL) tablet 650 mg  650 mg Oral Q6H PRN Laverle Hobby, PA-C      . alum & mag hydroxide-simeth (MAALOX/MYLANTA) 200-200-20 MG/5ML suspension 30 mL  30 mL Oral Q4H PRN Laverle Hobby, PA-C      . amitriptyline (ELAVIL) tablet 100 mg  100 mg Oral QHS Laverle Hobby, PA-C   100 mg at 07/02/13 2152  . citalopram (CELEXA) tablet 10 mg  10 mg Oral Daily Benjamine Mola, FNP   10 mg at 07/03/13 0758  . diclofenac (VOLTAREN) EC tablet 75 mg  75 mg Oral BID Laverle Hobby, PA-C   75 mg at 07/03/13 0758  . ferrous sulfate tablet 325 mg  325 mg Oral Q breakfast Laverle Hobby, PA-C   325 mg at 07/03/13 0758  .  hydrochlorothiazide (HYDRODIURIL) tablet 50 mg  50 mg Oral q morning - 10a Laverle Hobby, PA-C   50 mg at 07/03/13 0953  . hydrOXYzine (ATARAX/VISTARIL) tablet 25 mg  25 mg Oral Q6H PRN Laverle Hobby, PA-C      . lamoTRIgine (LAMICTAL) tablet 25 mg  25 mg Oral BID Benjamine Mola, FNP   25 mg at 07/03/13 0759  . lisinopril (PRINIVIL,ZESTRIL) tablet 10 mg  10 mg Oral q morning - 10a Laverle Hobby, PA-C   10 mg at 07/03/13 3716  . magnesium hydroxide (MILK OF MAGNESIA) suspension 30 mL  30 mL Oral Daily PRN Laverle Hobby, PA-C      . topiramate (TOPAMAX) tablet 50 mg  50 mg Oral BID Kathlee Nations, MD   50 mg at 07/03/13 9678     Lab Results:  Results for orders placed during the hospital encounter of 07/01/13 (from the past 48 hour(s))  ACETAMINOPHEN LEVEL     Status: None   Collection Time    07/01/13  5:48 PM      Result Value Ref Range   Acetaminophen (Tylenol), Serum <15.0  10 - 30 ug/mL   Comment:            THERAPEUTIC CONCENTRATIONS VARY     SIGNIFICANTLY. A RANGE OF 10-30     ug/mL MAY BE AN EFFECTIVE     CONCENTRATION FOR MANY PATIENTS.     HOWEVER, SOME ARE BEST TREATED     AT CONCENTRATIONS OUTSIDE THIS     RANGE.     ACETAMINOPHEN CONCENTRATIONS     >150 ug/mL AT 4 HOURS AFTER     INGESTION AND >50 ug/mL AT 12     HOURS AFTER INGESTION ARE     OFTEN ASSOCIATED WITH TOXIC     REACTIONS.  CBC     Status: Abnormal   Collection Time    07/01/13  5:48 PM      Result Value Ref Range   WBC 3.3 (*) 4.0 - 10.5 K/uL   RBC 4.89  4.22 - 5.81 MIL/uL   Hemoglobin 11.1 (*) 13.0 - 17.0 g/dL   HCT 36.9 (*) 39.0 - 52.0 %   MCV 75.5 (*) 78.0 - 100.0 fL   MCH 22.7 (*) 26.0 - 34.0 pg   MCHC 30.1  30.0 - 36.0 g/dL   RDW 16.1 (*) 11.5 - 15.5 %   Platelets 122 (*) 150 - 400 K/uL   Comment: REPEATED TO VERIFY     SPECIMEN CHECKED FOR CLOTS     PLATELET COUNT CONFIRMED BY SMEAR  COMPREHENSIVE METABOLIC PANEL     Status: Abnormal   Collection Time    07/01/13  5:48 PM      Result Value Ref Range   Sodium 143  137 - 147 mEq/L   Potassium 4.6  3.7 - 5.3 mEq/L   Chloride 110  96 - 112 mEq/L   CO2 22  19 - 32 mEq/L   Glucose, Bld 105 (*) 70 - 99 mg/dL   BUN 13  6 - 23 mg/dL   Creatinine, Ser 1.15  0.50 - 1.35 mg/dL   Calcium 8.5  8.4 - 10.5 mg/dL   Total Protein 7.2  6.0 - 8.3 g/dL   Albumin 3.6  3.5 - 5.2 g/dL   AST 17  0 - 37 U/L   ALT 16  0 - 53 U/L   Alkaline Phosphatase 259 (*) 39 - 117 U/L  Total Bilirubin 0.4  0.3 - 1.2 mg/dL   GFR calc non Af Amer 76 (*) >90 mL/min   GFR calc Af Amer 89 (*) >90 mL/min   Comment: (NOTE)     The eGFR has been calculated using the CKD EPI equation.      This calculation has not been validated in all clinical situations.     eGFR's persistently <90 mL/min signify possible Chronic Kidney     Disease.  ETHANOL     Status: None   Collection Time    07/01/13  5:48 PM      Result Value Ref Range   Alcohol, Ethyl (B) <11  0 - 11 mg/dL   Comment:            LOWEST DETECTABLE LIMIT FOR     SERUM ALCOHOL IS 11 mg/dL     FOR MEDICAL PURPOSES ONLY  SALICYLATE LEVEL     Status: Abnormal   Collection Time    07/01/13  5:48 PM      Result Value Ref Range   Salicylate Lvl <6.7 (*) 2.8 - 20.0 mg/dL  URINE RAPID DRUG SCREEN (HOSP PERFORMED)     Status: None   Collection Time    07/01/13  6:14 PM      Result Value Ref Range   Opiates NONE DETECTED  NONE DETECTED   Cocaine NONE DETECTED  NONE DETECTED   Benzodiazepines NONE DETECTED  NONE DETECTED   Amphetamines NONE DETECTED  NONE DETECTED   Tetrahydrocannabinol NONE DETECTED  NONE DETECTED   Barbiturates NONE DETECTED  NONE DETECTED   Comment:            DRUG SCREEN FOR MEDICAL PURPOSES     ONLY.  IF CONFIRMATION IS NEEDED     FOR ANY PURPOSE, NOTIFY LAB     WITHIN 5 DAYS.                LOWEST DETECTABLE LIMITS     FOR URINE DRUG SCREEN     Drug Class       Cutoff (ng/mL)     Amphetamine      1000     Barbiturate      200     Benzodiazepine   209     Tricyclics       470     Opiates          300     Cocaine          300     THC              50    Physical Findings: AIMS:  , ,  ,  ,    CIWA:    COWS:     Treatment Plan Summary: Daily contact with patient to assess and evaluate symptoms and progress in treatment Medication management, continue Celexa 10 mg daily.  Consider increasing the dose tomorrow.  Continue Lamictal 25 mg twice a day.  Patient does not have any rash and itching.  Encouraged to participate in group therapy.  I will add Topamax to help his migraine headaches.  Patient had a good response with Topamax in the past.  Discussed risks and benefits of medication.   Discussed coping skills for depression and anxiety.  Length of stay 3-4 days.  Medical Decision Making Problem Points:  Established problem, worsening (2), New problem, with additional work-up planned (4), Review of last therapy session (1) and Review of psycho-social stressors (1)  Data Points:  Review and summation of old records (2) Review of medication regiment & side effects (2) Review of new medications or change in dosage (2)  I certify that inpatient services furnished can reasonably be expected to improve the patient's condition.   Zachary [name] T. 07/03/2013, 2:01 PM

## 2013-07-03 NOTE — [facility] Group Notes (Signed)
[facility] Group Notes:  (Nursing/MHT/Case Management/Adjunct)  Date:  07/03/2013  Time:  1:15 PM  Type of Therapy:  Psychoeducational Skills  Participation Level:  Did Not Attend  Participation Quality:  N/A  Affect:  N/A  Cognitive:  N/A  Insight:  None  Engagement in Group:  N/A  Modes of Intervention:  N/A  Summary of Progress/Problems: Pt. Did not attend group, Pt. Was resting.  Tylene FantasiaRichardson, Altheia Shafran N 07/03/2013, 1:15 PM

## 2013-07-04 MED ORDER — HYDROXYZINE HCL 25 MG PO TABS
25.0000 mg | ORAL_TABLET | Freq: Once | ORAL | Status: DC
  Filled 2013-07-04: qty 1

## 2013-07-04 MED ORDER — TOPIRAMATE 25 MG PO TABS
75.0000 mg | ORAL_TABLET | Freq: Two times a day (BID) | ORAL | Status: DC
  Administered 2013-07-04 – 2013-07-08 (×8): 75 mg via ORAL
  Filled 2013-07-04: qty 3
  Filled 2013-07-04: qty 42
  Filled 2013-07-04 (×5): qty 3
  Filled 2013-07-04: qty 42
  Filled 2013-07-04 (×4): qty 3

## 2013-07-04 NOTE — Progress Notes (Signed)
Recreation Therapy Notes  Animal-Assisted Activity/Therapy (AAA/T) Program Checklist/Progress Notes Patient Eligibility Criteria Checklist & Daily Group note for Rec Tx Intervention  Date: 07.02.2015 Time: 3:15pm Location: [address] Dayroom   AAA/T Program Assumption of Risk Form signed by Patient/ or Parent Legal Guardian yes  Patient is free of allergies or sever asthma yes  Patient reports no fear of animals yes  Patient reports no history of cruelty to animals yes   Patient understands his/her participation is voluntary yes  Behavioral Response: Did not attend.   Blenda Wisecup L Annina Piotrowski, LRT/CTRS  Zachary [name] L 07/04/2013 4:11 PM 

## 2013-07-04 NOTE — Progress Notes (Signed)
Patient ID: Zachary [name], male   DOB: 1970/02/28, 43 y.o.   MRN: [id number] [facility] [place] [facility], but clarified later to mean enteric coated. He took it this am and did not benefit from it but agreed to take it tonight anyway. He is a bit down tonight because he was expecting company tonight including his girlfriend but no one cam and he is sad about it.He is reporting pain of a 7 which he says is his baseline without medications, pain is in his knees. Overall, is pleasant and verbal.

## 2013-07-04 NOTE — Progress Notes (Signed)
Patient ID: Zachary [name], male   DOB: [birth year], 43 y.o.   MRN: [id number] [facility] MD Progress Note  07/04/2013 3:16 PM WASIM GOODNOW  MRN:  [id number]  Subjective:  States he has had headaches more often since he was admitted. Describes partial improvement, and is hoping to be discharged soon.  Objective: Patient states he is feeling better. He states he still feels depressed, but significantly better compared to admission. At this time he denies any suicidal ideations and there have been no self injurious behaviors on unit.  He states recent stress with roommate ( states roommate had been trying to prove another person had stolen his identity, and finally this person was        "caught" and incarcerated, but that this whole issue made his household situation quite stressful.  He is optimistic that now that this situation has improved, his home situation will be calmer and less overwhelming. He  identifies his GF as closest support network and " she helps me stay stable".  At this time denies any medication side effects. He is going to groups and his behavior on unit has been in good control. Patient ambulates with cane due to chronic knee pain, but gait is steady, denies any falls.  Patient has been diagnosed with Bipolar Disorder , Depressed. We reviewed history- at this time he describes depression as major issue. He has had brief, relatively mild episodes of " mood swings", but at this time does not describe any clear episodes of mania.      Total Time spent with patient: 20 minutes  Axis I: Bipolar, Depressed Axis II: Deferred Axis III:  Past Medical History  Diagnosis Date  . Obesity   . Bipolar 1 disorder, depressed   . PTSD (post-traumatic stress disorder)   . Arthritis   . Fibromyalgia    Axis IV: economic problems, other psychosocial or environmental problems, problems related to social environment and problems with primary support group  ADL's:  Improving   Sleep:  improved   Appetite:  Fair  Psychiatric Specialty Exam: Physical Exam  Review of Systems  Constitutional: Negative for fever and chills.  HENT:       History of migraines  Musculoskeletal: Negative.        Chronic knee pain  Neurological: Positive for headaches.  Psychiatric/Behavioral: Positive for depression. Negative for suicidal ideas and hallucinations.    Blood pressure 92/63, pulse 98, temperature 97.2 F (36.2 C), temperature source Oral, resp. rate 18.There is no weight on file to calculate BMI.  General Appearance: Fairly groomed   Patent attorney::  Good eye contact  Speech:  Normal   Volume:  Normal  Mood:  Depressed, but reports it is improving   Affect:  Constricted but reactive   Thought Process:  Goal Directed  Orientation:  Full (Time, Place, and Person)  Thought Content:  No hallucinations, and no delusions   Suicidal Thoughts:  Currently denies any suicidal ideations and contracts for safety on the unit   Homicidal Thoughts:  No  Memory: NA   Judgement:  Fair  Insight:  Fair  Psychomotor Activity:  Slowed, which he states is partly due to fibromyalgia  Concentration:  Fair  Recall:  [employer] of Knowledge:Fair  Language: Fair  Akathisia:  No  Handed:  Right  AIMS (if indicated):     Assets:  Communication Skills Desire for Improvement Housing Social Support  Sleep:  Number of Hours: 6.75   Musculoskeletal: Strength & Muscle  Tone: within normal limits- walks with cane  Gait & Station: normal Patient leans: N/A  Current Medications: Current Facility-Administered Medications  Medication Dose Route Frequency Provider Last Rate Last Dose  . acetaminophen (TYLENOL) tablet 650 mg  650 mg Oral Q6H PRN Kerry Hough, PA-C      . alum & mag hydroxide-simeth (MAALOX/MYLANTA) 200-200-20 MG/5ML suspension 30 mL  30 mL Oral Q4H PRN Kerry Hough, PA-C      . amitriptyline (ELAVIL) tablet 100 mg  100 mg Oral QHS Kerry Hough, PA-C   100 mg at 07/03/13  2113  . citalopram (CELEXA) tablet 10 mg  10 mg Oral Daily Beau Fanny, FNP   10 mg at 07/04/13 0846  . diclofenac (VOLTAREN) EC tablet 75 mg  75 mg Oral BID Kerry Hough, PA-C   75 mg at 07/04/13 0846  . ferrous sulfate tablet 325 mg  325 mg Oral Q breakfast Kerry Hough, PA-C   325 mg at 07/04/13 0847  . hydrochlorothiazide (HYDRODIURIL) tablet 50 mg  50 mg Oral q morning - 10a Kerry Hough, PA-C   50 mg at 07/03/13 6962  . hydrOXYzine (ATARAX/VISTARIL) tablet 25 mg  25 mg Oral Q6H PRN Kerry Hough, PA-C   25 mg at 07/03/13 2211  . lamoTRIgine (LAMICTAL) tablet 25 mg  25 mg Oral BID Beau Fanny, FNP   25 mg at 07/04/13 0851  . lisinopril (PRINIVIL,ZESTRIL) tablet 10 mg  10 mg Oral q morning - 10a Kerry Hough, PA-C   10 mg at 07/04/13 1041  . magnesium hydroxide (MILK OF MAGNESIA) suspension 30 mL  30 mL Oral Daily PRN Kerry Hough, PA-C      . topiramate (TOPAMAX) tablet 50 mg  50 mg Oral BID Cleotis Nipper, MD   50 mg at 07/04/13 0848    Lab Results:  No results found for this or any previous visit (from the past 48 hour(s)).  Physical Findings: AIMS:  , ,  ,  ,    CIWA:    COWS:    Assessment: Patient is gradually improving, although he remains depressed. He is not currently suicidal . He is tolerating Celexa and Lamictal well. It is not felt that these medications are causing headache, as patient states he suffers from chronic headaches/migraines, for which he states he has been taking topamax, with no side effects and some improvement.  Treatment Plan Summary: Daily contact with patient to assess and evaluate symptoms and progress in treatment Medication management,  Continue  Celexa 10 mg daily.  Continue Lamictal 25 mg twice a day.  Increase Topamax to 75 mgrs BID   Side effects reviewed. Of note, patient wants to return home and follow up with  outpatient psychiatric management after discharge  Medical Decision Making Problem Points:  Established  problem, stable/improving (1), Review of last therapy session (1) and Review of psycho-social stressors (1) Data Points:  Review or order clinical lab tests (1) Review of new medications or change in dosage (2)  I certify that inpatient services furnished can reasonably be expected to improve the patient's condition.   Delcia Spitzley 07/04/2013, 3:16 PM

## 2013-07-04 NOTE — Progress Notes (Cosign Needed)
D) Pt has been sad, flat,and depressed. Pt has been negative for groups, or will need prompting. Pt ambulates with a cane. C/o knee pain and a "migraine headache". Pt has been laying in bed with shirt over his eyes. however he is easily engaged. Pt c/o insomnia. Pt refused his diuretic. Denies s.i. A) level 3 obs for safety, support and encouragement provided. meds as ordered. Med ed reinforced. R) Receptive.

## 2013-07-04 NOTE — [facility] Group Notes (Signed)
[facility] LCSW Group Therapy  [facility] 1:15 - 2:30 PM  07/04/2013   Type of Therapy:  Group Therapy  Participation Level: Active  Participation Quality:  Attentive  Affect:  Appropriate  Cognitive:  Appropriate  Insight:  Developing/Improving and Engaged  Engagement in Therapy:  Developing/Improving Engaged  Modes of Intervention:  Discussion, Education, Exploration, Problem-Solving, Rapport Building, Support   Summary of Progress/Problems:  Patient listened attentively to speaker from [facility] He stated he is interested in follow up with [facility]  Zachary [name], Zachary [name] 07/04/2013

## 2013-07-04 NOTE — Progress Notes (Signed)
D: Pt is flat in affect and depressed in mood. Pt is cooperative but can be sarcastic in undertone. Pt feels that his meds are not quite right in treating his symptoms. Pt is currently denying any suicidal ideation. Pt verbally contracts for safety. He verbalizes having chronic pain but refuses any pain medication due to his tolerance to pain medications. Pt is bit upset from having one of his visitors not show up during dinner. He has requested for this visitor not to return on Friday. Pt initially declined to go to [employer]. Pt is being weary about becoming friendly with any of the patients. However, pt did decide to go to [employer] after all. Pt had unrelieved anxiety with the administration of vistaril. On-call extender ordered an additional one time dose of Vistaril. Pt in bed resting with eyes closed, when writer attempted to offer med. A: Writer administered scheduled and prn medications to pt. Writer encouraged deep breathing exercises to alleviate anxiety along with the administration of Vistaril. Pt was encouraged to dim the room lights and use an ice pack to alleviate his 5/10 headache. Continued support and availability as needed was extended to this pt. Staff continue to monitor pt with q6915min checks.  R: No adverse drug reactions noted. Pt not accepting of alternative pain relief methods. Pt receptive to treatment. Pt remains safe at this time.

## 2013-07-05 MED ORDER — CITALOPRAM HYDROBROMIDE 20 MG PO TABS
20.0000 mg | ORAL_TABLET | Freq: Every day | ORAL | Status: DC
  Administered 2013-07-06 – 2013-07-08 (×3): 20 mg via ORAL
  Filled 2013-07-05: qty 1
  Filled 2013-07-05: qty 7
  Filled 2013-07-05 (×3): qty 1

## 2013-07-05 NOTE — Progress Notes (Signed)
D: Pt denies SI/HI/AV. Pt is pleasant and cooperative. Pt rates depression at a 5, anxiety at a 4, and Helplessness/hopelessness at a 5.  A: Pt was offered support and encouragement. Pt was given scheduled medications. Pt was encourage to attend groups. Q 15 minute checks were done for safety.  R:Pt attends groups and interacts well with peers and staff. Pt taking medication. Pt has no complaints.Pt receptive to treatment and safety maintained on unit.

## 2013-07-05 NOTE — Progress Notes (Signed)
D) Pt asked for a cup and placed a small stone in it. States that he had to void several times during the night. States "I know why now". Reports a history of kidney stones and states he usually passes them himself to save money. Affect is flat and has a very dry sense of humor which he uses at times to deflect the seriousness of a situation. Pt  rates his depression and hopelessness both at a 5 and states he is having thoughts of SI on and off throughout the day.  A) Pt given support, reassurance and appropriate praise. Provided with a 1:1. Verbal contract made with Pt for his safety on the unit. R) Pt going to attend the groups. Interacting with his peers appropriately.

## 2013-07-05 NOTE — Progress Notes (Signed)
Patient ID: Zachary [name], male   DOB: [birth year], 43 y.o.   MRN: [id number] [facility] MD Progress Note  07/05/2013 2:03 PM Zachary [name]  MRN:  [id number]  Subjective: States " I passed a kidney stone " He also reports having had a panic attack last night during a group activity ( karaoke) , after which he had difficulty falling asleep.  Objective: With regards to his depression , patient states he is feeling better, although still not back to baseline and still feeling depressed. He also reports decreasing anxiety, although last night did feel acutely anxious in the context of a group activity. States that by today he is feeling better, and at this time appears calm, comfortable, without any symptoms of significant/current anxiety. He is tolerating medications well, without side effects. As noted above, he states yesterday he passed a kidney stone. He does report a history of urolithiasis, and has had several over the last few years. Behavior on  Unit in good control.      Total Time spent with patient: 20 minutes  Axis I: Bipolar, Depressed Axis II: Deferred Axis III:  Past Medical History  Diagnosis Date  . Obesity   . Bipolar 1 disorder, depressed   . PTSD (post-traumatic stress disorder)   . Arthritis   . Fibromyalgia    Axis IV: economic problems, other psychosocial or environmental problems, problems related to social environment and problems with primary support group  ADL's:  Improving   Sleep:  States he slept fairly last night    Appetite:  Fair  Psychiatric Specialty Exam: Physical Exam  Review of Systems  Constitutional: Negative for fever and chills.  HENT:       History of migraines  Musculoskeletal: Negative.        Chronic knee pain  Neurological: Positive for headaches.  Psychiatric/Behavioral: Positive for depression. Negative for suicidal ideas and hallucinations.    Blood pressure 112/76, pulse 92, temperature 97.3 F (36.3 C), temperature source  Oral, resp. rate 20.There is no weight on file to calculate BMI.  General Appearance: Fairly groomed   Patent attorney::  Good eye contact  Speech:  Normal   Volume:  Normal  Mood:  Depressed ( improved compared to admission)   Affect:  Constricted but reactive , no significant anxiety at this time  Thought Process:  Goal Directed  Orientation:  Full (Time, Place, and Person)  Thought Content:  No hallucinations, and no delusions   Suicidal Thoughts:  Denies  any suicidal ideations and contracts for safety on the unit   Homicidal Thoughts:  No  Memory: NA   Judgement:  Fair  Insight:  Fair  Psychomotor Activity:  Normal  Concentration:  Fair  Recall:  [employer] of Knowledge:Fair  Language: Fair  Akathisia:  No  Handed:  Right  AIMS (if indicated):     Assets:  Communication Skills Desire for Improvement Housing Social Support  Sleep:  Number of Hours: 6.75   Musculoskeletal: Strength & Muscle Tone: within normal limits- walks with cane  Gait & Station: normal Patient leans: N/A  Current Medications: Current Facility-Administered Medications  Medication Dose Route Frequency Provider Last Rate Last Dose  . acetaminophen (TYLENOL) tablet 650 mg  650 mg Oral Q6H PRN Kerry Hough, PA-C      . alum & mag hydroxide-simeth (MAALOX/MYLANTA) 200-200-20 MG/5ML suspension 30 mL  30 mL Oral Q4H PRN Kerry Hough, PA-C      . amitriptyline (ELAVIL) tablet 100 mg  100 mg Oral QHS Kerry Hough, PA-C   100 mg at 07/04/13 2129  . [START ON 07/06/2013] citalopram (CELEXA) tablet 20 mg  20 mg Oral Daily Nehemiah Massed, MD      . diclofenac (VOLTAREN) EC tablet 75 mg  75 mg Oral BID Kerry Hough, PA-C   75 mg at 07/05/13 0817  . ferrous sulfate tablet 325 mg  325 mg Oral Q breakfast Kerry Hough, PA-C   325 mg at 07/05/13 8119  . hydrochlorothiazide (HYDRODIURIL) tablet 50 mg  50 mg Oral q morning - 10a Kerry Hough, PA-C   50 mg at 07/05/13 1009  . hydrOXYzine (ATARAX/VISTARIL)  tablet 25 mg  25 mg Oral Q6H PRN Kerry Hough, PA-C   25 mg at 07/04/13 2128  . hydrOXYzine (ATARAX/VISTARIL) tablet 25 mg  25 mg Oral Once Kristeen Mans, NP      . lamoTRIgine (LAMICTAL) tablet 25 mg  25 mg Oral BID Beau Fanny, FNP   25 mg at 07/05/13 0817  . lisinopril (PRINIVIL,ZESTRIL) tablet 10 mg  10 mg Oral q morning - 10a Kerry Hough, PA-C   10 mg at 07/05/13 1009  . magnesium hydroxide (MILK OF MAGNESIA) suspension 30 mL  30 mL Oral Daily PRN Kerry Hough, PA-C      . topiramate (TOPAMAX) tablet 75 mg  75 mg Oral BID Nehemiah Massed, MD   75 mg at 07/05/13 1478    Lab Results:  No results found for this or any previous visit (from the past 48 hour(s)).  Physical Findings: AIMS: Facial and Oral Movements Muscles of Facial Expression: None, normal Lips and Perioral Area: None, normal Jaw: None, normal Tongue: None, normal,Extremity Movements Upper (arms, wrists, hands, fingers): None, normal Lower (legs, knees, ankles, toes): None, normal, Trunk Movements Neck, shoulders, hips: None, normal, Overall Severity Severity of abnormal movements (highest score from questions above): None, normal Incapacitation due to abnormal movements: None, normal Patient's awareness of abnormal movements (rate only patient's report): No Awareness, Dental Status Current problems with teeth and/or dentures?: No Does patient usually wear dentures?: No  CIWA:    COWS:    Assessment: Continues to gradually improve, and is becoming more future oriented, looking forward to discharge home soon. He did have a panic attack last night, possibly related to being in a scenario where he might be asked to participate ( karaoke) , but by today he is calm, not anxious. Describes a history of kidney stones and describes passing one yesterday. Denies hematuria, and does not currently present with any significant pain or acute discomfort. Based on report of repeated kidney stones, depression/mood disorder,  easily becoming fatigued, will order a  PTH level.   Treatment Plan Summary: Daily contact with patient to assess and evaluate symptoms and progress in treatment Medication management,  Continue  Celexa 10 mg daily.  Continue Lamictal 25 mg twice a day.  Continue Topamax to 75 mgrs BID   Order PTH level Consider discharge home soon as he continues to improve/stabilize  Medical Decision Making Problem Points:  Established problem, stable/improving (1), Review of last therapy session (1) and Review of psycho-social stressors (1) Data Points:  Review or order clinical lab tests (1) Review of new medications or change in dosage (2)  I certify that inpatient services furnished can reasonably be expected to improve the patient's condition.   Yarely Bebee 07/05/2013, 2:03 PM

## 2013-07-05 NOTE — [facility] Group Notes (Signed)
[facility]   07/05/2013 1:12 PM    Participation Quality:  Appropraite  Mood/Affect:  Appropriate  Depression Rating:  5  Anxiety Rating:  5  Thoughts of Suicide: Yes  Will you contract for safety?  Yes  Current AVH:  No  Plan for Discharge/Comments:  Patient attended discharge planning group and actively participated in group.  [facility] CSW provided all participants with daily workbook.   Transportation Means: Patient has transportation.   Supports:  Patient has a support system.   Brucha Ahlquist, Joesph JulyQuylle Hairston

## 2013-07-05 NOTE — Tx Team (Signed)
Interdisciplinary Treatment Plan Update   Date Reviewed:  07/05/2013  Time Reviewed:  8:40 AM  Progress in Treatment:   Attending groups: Yes Participating in groups: Yes Taking medication as prescribed: Yes  Tolerating medication: Yes Family/Significant other contact made:  Yes, collateral contact with girlfriend Patient understands diagnosis: Yes  Discussing patient identified problems/goals with staff: Yes Medical problems stabilized or resolved: Yes Denies suicidal/homicidal ideation: No, but contracts for safety Patient has not harmed self or others: Yes  For review of initial/current patient goals, please see plan of care.  Estimated Length of Stay:  3-5 days  Reasons for Continued Hospitalization:  Anxiety Depression Medication stabilization Suicidal ideation  New Problems/Goals identified:    Discharge Plan or Barriers:   Home with [facility] follow [facility]  Additional Comments:     Attendees:  Patient:  07/05/2013 8:40 AM   Signature:  Sallyanne HaversF. Cobos MD 07/05/2013 8:40 AM  Signature: Genelle GatherPatti Dukes, RN 07/05/2013 8:40 AM  Signature:  Lamount Crankerhris Judge, RN 07/05/2013 8:40 AM  Signature: Harold Barbanonecia Byrd, RN 07/05/2013 8:40 AM  Signature: 07/05/2013 8:40 AM  Signature:  Mackenna Kamer, LCSW 07/05/2013 8:40 AM   07/05/2013 8:40 AM   07/05/2013 8:40 AM   07/05/2013 8:40 AM   07/05/2013  8:40 AM  Signature:  07/05/2013  8:40 AM  Signature: 07/05/2013  8:40 AM    Scribe for Treatment Team:   Juline PatchQuylle Claxton Levitz,  07/05/2013 8:40 AM

## 2013-07-05 NOTE — [facility] Group Notes (Signed)
[facility] LCSW Group Therapy  Feelings Around Relapse 1:15 -2:30        07/05/2013   Type of Therapy:  Group Therapy  Participation Level:  Appropriate  Participation Quality:  Appropriate  Affect:  Depressed  Cognitive:  Attentive Appropriate  Insight:  Developing/Improving  Engagement in Therapy: Developing/Improving  Modes of Intervention:  Discussion Exploration Problem-Solving Supportive  Summary of Progress/Problems:  The topic for today was feelings around relapse.    Patient processed feelings toward relapse and was able to relate to peers.  He advised relapse for him would be focusing more on the needs of others than his own mental well being.  Patient shared this is the first time he has sought mental health help on his own.   Patient identified coping skills that can be used to prevent a relapse.   [name], Zachary [name] 07/05/2013

## 2013-07-05 NOTE — Progress Notes (Signed)
[facility] Group Notes:  (Nursing/MHT/Case Management/Adjunct)  Date:  07/05/2013  Time:  11:50 PM  Type of Therapy:  Group Therapy  Participation Level:  Minimal  Participation Quality:  Appropriate  Affect:  Appropriate  Cognitive:  Appropriate  Insight:  Appropriate  Engagement in Group:  Engaged  Modes of Intervention:  Socialization and Support  Summary of Progress/Problems: Pt. Stated his energy level was 4.  Pt. Stated withdraw from activities and people were early warning signs of relapse.  Pt. Stated his family and girlfriend are his support system.  Sondra ComeWilson, Konnor Vondrasek J 07/05/2013, 11:50 PM

## 2013-07-06 MED ORDER — KETOROLAC TROMETHAMINE 30 MG/ML IJ SOLN
60.0000 mg | Freq: Once | INTRAMUSCULAR | Status: AC
  Administered 2013-07-06: 60 mg via INTRAVENOUS
  Filled 2013-07-06 (×2): qty 2

## 2013-07-06 MED ORDER — KETOROLAC TROMETHAMINE 60 MG/2ML IM SOLN: 60.0000 mg | Freq: Once | INTRAMUSCULAR | Status: DC

## 2013-07-06 NOTE — Progress Notes (Signed)
D: Pt in a pleasant mood. Interacting with his peers and singing songs for them. He states that he is taking things day by day. When asked to name some things about himself, he stated "I'm a good cook, creative, compassionate, and I like to read".  A: Support given. Verbalization encouraged. Medications given as prescribed. Pt encouraged to come to staff with any concerns.  R: Pt is receptive. No complaints of pain or discomfort at this time. Q15 min safety checks maintained. Will continue to monitor pt.

## 2013-07-06 NOTE — Progress Notes (Signed)
Patient ID: Zachary DodrillJames E Cordaro, male   DOB: 1970/07/31, 43 y.o.   MRN: [id number]   D: Pt has been very flat and depressed today on the unit, he reported that he is passing Kidney Stones and no one is giving him anything for pain. Pt reported that tylenol is nothing and that he needs something for pain. Pt has also been asking to be discharged, due to not being treated fairly. Pt was seen by Fredna Dowakia NP, new orders noted. Pt reported being negative SI/HI, no AH/VH noted. A: 15 min checks continued for patient safety. R: Pt safety maintained.

## 2013-07-06 NOTE — [facility] Group Notes (Signed)
Adult Psychoeducational Group Note  Date:  07/06/2013 Time:  11:32 PM  Group Topic/Focus:  Wrap-Up Group:   The focus of this group is to help patients review their daily goal of treatment and discuss progress on daily workbooks.  Participation Level:  Minimal  Participation Quality:  Attentive and Sharing  Affect:  Defensive and Flat  Cognitive:  Appropriate  Insight: Lacking  Engagement in Group:  Defensive and Resistant  Modes of Intervention:  Support  Additional Comments:  Pt attended wrap up group this evening.  He reports that he was suppose to discharge today and he did not.  He expresses his discharge plan to take one day at a time.  Pt would not elaborate on his discharge plan more than that.  He reports to this underwriter that she was too serious and he needed to be able to have fun he had been in groups all day.  He reports 3 positive thing about himself to listen to others, talk with others, and cooking.  Aundria RudWILKINSON, Corbin Falck L 07/06/2013, 11:32 PM

## 2013-07-06 NOTE — Progress Notes (Signed)
Psychoeducational Group Note  Date: 07/06/2013 Time:  1015  Group Topic/Focus:  Identifying Needs:   The focus of this group is to help patients identify their personal needs that have been historically problematic and identify healthy behaviors to address their needs.  Participation Level:  Active  Participation Quality:  Appropriate  Affect:  Appropriate  Cognitive:  Oriented  Insight:  Improving  Engagement in Group:  Engaged  Additional Comments:  Pt attended and was involved in the discussions. Added much to the group  Kye Silverstein A  

## 2013-07-06 NOTE — [facility] Group Notes (Signed)
[facility] Group Notes:  (Nursing/MHT/Case Management/Adjunct)  Date:  07/06/2013  Time:  11:51 AM  Type of Therapy:  Psychoeducational Skills  Participation Level:  Active  Participation Quality:  Appropriate  Affect:  Appropriate  Cognitive:  Appropriate  Insight:  Appropriate  Engagement in Group:  Engaged  Modes of Intervention:  Discussion  Summary of Progress/Problems: Pt did attend self inventory group, pt reported that he was negative SI/HI, no AH/VH noted. Pt rated his depression as a 4, and his helplessness/hopelessness as a 4.     Pt reported no issues or concerns.   Jacquelyne BalintForrest, Sherica Paternostro Shanta 07/06/2013, 11:51 AM

## 2013-07-06 NOTE — Progress Notes (Signed)
Patient ID: Zachary [name], male   DOB: Aug 03, 1970, 43 y.o.   MRN: [id number] [facility] MD Progress Note  07/06/2013 1:29 PM Zachary [name]  MRN:  [id number]  Subjective: States "" He also reports having had minor(random) panic attacks, for no known cause besides "wanting to get out of here". Pt states he is trying to do well by attending groups, learning coping skills, and trying to do better. He is a little disappointed because his previous provider advised him that he would leave today. He is not in the best of moods, because he had his hopes up high and then turned down. Depression 4-5/10 and anxiety 4/10, Denies SI/HI/AVH. Continues to endorse poor sleep due to high amounts of pain due to kidney stones, with no pain management. He also reports a migraine since coming into the hospital. He reports medication compliance with topamax, denies s/e at this time.   Objective: With regards to his depression , patient states he is feeling better, although still not back to baseline and still feeling depressed.  States that by today he is feeling better, and at this time appears calm, comfortable, without any symptoms of significant/current anxiety. He is tolerating medications well, without side effects. He states yesterday he passed a kidney stone, however is still having discomfort with urination. He does report a history of urolithiasis, and has had several over the last few years. Behavior on  Unit in good control.      Total Time spent with patient: 20 minutes  Axis I: Bipolar, Depressed Axis II: Deferred Axis III:  Past Medical History  Diagnosis Date  . Obesity   . Bipolar 1 disorder, depressed   . PTSD (post-traumatic stress disorder)   . Arthritis   . Fibromyalgia    Axis IV: economic problems, other psychosocial or environmental problems, problems related to social environment and problems with primary support group  ADL's:  Improving   Sleep:  poor   Appetite:  Fair  Psychiatric  Specialty Exam: Physical Exam   Review of Systems  Constitutional: Negative for fever and chills.  HENT:       History of migraines  Musculoskeletal: Negative.        Chronic knee pain  Neurological: Positive for headaches.  Psychiatric/Behavioral: Positive for depression. Negative for suicidal ideas and hallucinations.    Blood pressure 94/63, pulse 91, temperature 97.1 F (36.2 C), temperature source Oral, resp. rate 18.There is no weight on file to calculate BMI.  General Appearance: Fairly groomed   Patent attorney::  Good eye contact  Speech:  Normal   Volume:  Normal  Mood:  Depressed ( improved compared to admission)   Affect:  Constricted but reactive , no significant anxiety at this time  Thought Process:  Goal Directed  Orientation:  Full (Time, Place, and Person)  Thought Content:  No hallucinations, and no delusions   Suicidal Thoughts:  Denies  any suicidal ideations and contracts for safety on the unit   Homicidal Thoughts:  No  Memory: NA   Judgement:  Fair  Insight:  Fair  Psychomotor Activity:  Normal  Concentration:  Fair  Recall:  [employer] of Knowledge:Fair  Language: Fair  Akathisia:  No  Handed:  Right  AIMS (if indicated):     Assets:  Communication Skills Desire for Improvement Housing Social Support  Sleep:  Number of Hours: 5.5   Musculoskeletal: Strength & Muscle Tone: within normal limits- walks with cane  Gait & Station: normal Patient  leans: N/A  Current Medications: Current Facility-Administered Medications  Medication Dose Route Frequency Provider Last Rate Last Dose  . acetaminophen (TYLENOL) tablet 650 mg  650 mg Oral Q6H PRN Kerry Hough, PA-C   650 mg at 07/06/13 1207  . alum & mag hydroxide-simeth (MAALOX/MYLANTA) 200-200-20 MG/5ML suspension 30 mL  30 mL Oral Q4H PRN Kerry Hough, PA-C      . amitriptyline (ELAVIL) tablet 100 mg  100 mg Oral QHS Kerry Hough, PA-C   100 mg at 07/05/13 2114  . citalopram (CELEXA) tablet  20 mg  20 mg Oral Daily Nehemiah Massed, MD   20 mg at 07/06/13 0748  . diclofenac (VOLTAREN) EC tablet 75 mg  75 mg Oral BID Kerry Hough, PA-C   75 mg at 07/06/13 0747  . ferrous sulfate tablet 325 mg  325 mg Oral Q breakfast Kerry Hough, PA-C   325 mg at 07/06/13 0748  . hydrochlorothiazide (HYDRODIURIL) tablet 50 mg  50 mg Oral q morning - 10a Kerry Hough, PA-C   50 mg at 07/05/13 1009  . hydrOXYzine (ATARAX/VISTARIL) tablet 25 mg  25 mg Oral Q6H PRN Kerry Hough, PA-C   25 mg at 07/05/13 2242  . hydrOXYzine (ATARAX/VISTARIL) tablet 25 mg  25 mg Oral Once Kristeen Mans, NP      . lamoTRIgine (LAMICTAL) tablet 25 mg  25 mg Oral BID Beau Fanny, FNP   25 mg at 07/06/13 0748  . lisinopril (PRINIVIL,ZESTRIL) tablet 10 mg  10 mg Oral q morning - 10a Kerry Hough, PA-C   10 mg at 07/05/13 1009  . magnesium hydroxide (MILK OF MAGNESIA) suspension 30 mL  30 mL Oral Daily PRN Kerry Hough, PA-C      . topiramate (TOPAMAX) tablet 75 mg  75 mg Oral BID Nehemiah Massed, MD   75 mg at 07/06/13 1610    Lab Results:  No results found for this or any previous visit (from the past 48 hour(s)).  Physical Findings: AIMS: Facial and Oral Movements Muscles of Facial Expression: None, normal Lips and Perioral Area: None, normal Jaw: None, normal Tongue: None, normal,Extremity Movements Upper (arms, wrists, hands, fingers): None, normal Lower (legs, knees, ankles, toes): None, normal, Trunk Movements Neck, shoulders, hips: None, normal, Overall Severity Severity of abnormal movements (highest score from questions above): None, normal Incapacitation due to abnormal movements: None, normal Patient's awareness of abnormal movements (rate only patient's report): No Awareness, Dental Status Current problems with teeth and/or dentures?: No Does patient usually wear dentures?: No  CIWA:    COWS:    Assessment: Continues to gradually improve, and is becoming more future oriented, looking  forward to discharge home soon. He did have a panic attack last night, possibly related to being in a scenario where he might be asked to participate ( karaoke) , but by today he is calm, not anxious. Describes a history of kidney stones and describes passing one yesterday. Denies hematuria, and does not currently present with any significant pain or acute discomfort. Based on report of repeated kidney stones, depression/mood disorder, easily becoming fatigued, will order a  PTH level.   Treatment Plan Summary: Daily contact with patient to assess and evaluate symptoms and progress in treatment Medication management,  Continue  Celexa 10 mg daily.  Continue Lamictal 25 mg twice a day.  Continue Topamax to 75 mgrs BID   Order PTH level. Toradol 60mg  IM in a single dose for migraine  and pain with urination.  Consider discharge home soon as he continues to improve/stabilize  Medical Decision Making Problem Points:  Established problem, stable/improving (1), Review of last therapy session (1) and Review of psycho-social stressors (1) Data Points:  Review or order clinical lab tests (1) Review of new medications or change in dosage (2)  I certify that inpatient services furnished can reasonably be expected to improve the patient's condition.   Malachy Chamber S FNP-BC 07/06/2013, 1:29 PM

## 2013-07-07 NOTE — Progress Notes (Signed)
Patient ID: Zachary [name], male   DOB: 06-Sep-1970, 43 y.o.   MRN: [id number] [facility]  07/07/2013 10:30 AM Zachary [name]  MRN:  [id number]  Subjective: States " Im kind of hurting, both my shoulders hurt and I cant raise my shoulders. Denies any panic attacks since yesterday.  Pt states he is trying to do well by attending groups, learning coping skills, and trying to do better. Currently rates his depression 2-3/10 and anxiety 2/10. Denies SI/HI/AVH. He also reports a migraine since coming into the hospital. He reports medication compliance with topamax, denies s/e at this time. Upon discharge he plans to take his medication as directed, and consult with new doctor. He states his previous provider whom was handling id med management was doing so incorrectly "she was not listening and just doubling my meds". He plans to continue seeing his therapist at family [facility], and working on coping skills and isolating.    Objective: He was given Toradol IM yesterday, that took the edge off of is pain but no relief.  States that today he is feeling better, and at this time appears calm, comfortable, without any symptoms of significant/current anxiety. He is tolerating medications well, without side effects.  He notes decrease in back pain, urinary symptoms, and his urine is now clear. Behavior on  Unit in good control.      Total Time spent with patient: 20 minutes  Axis I: Bipolar, Depressed Axis II: Deferred Axis III:  Past Medical History  Diagnosis Date  . Obesity   . Bipolar 1 disorder, depressed   . PTSD (post-traumatic stress disorder)   . Arthritis   . Fibromyalgia    Axis IV: economic problems, other psychosocial or environmental problems, problems related to social environment and problems with primary support group  ADL's:  Improving   Sleep:  Fair, improving  Appetite:  Fair  Psychiatric Specialty Exam: Physical Exam   Review of Systems  Constitutional:  Negative for fever and chills.  HENT:       History of migraines  Musculoskeletal: Negative.        Chronic knee pain  Neurological: Positive for headaches.  Psychiatric/Behavioral: Positive for depression. Negative for suicidal ideas and hallucinations.    Blood pressure 105/72, pulse 120, temperature 98.4 F (36.9 C), temperature source Oral, resp. rate 20.There is no weight on file to calculate BMI.  General Appearance: Fairly groomed   Patent attorneyye Contact::  Good eye contact  Speech:  Normal   Volume:  Normal  Mood:  Depressed ( improved compared to admission)   Affect:  appropriate and congruent  Thought Process:  Goal Directed  Orientation:  Full (Time, Place, and Person)  Thought Content:  No hallucinations, and no delusions   Suicidal Thoughts:  Denies  any suicidal ideations and contracts for safety on the unit   Homicidal Thoughts:  No  Memory: NA   Judgement:  Fair  Insight:  Fair  Psychomotor Activity:  Normal  Concentration:  Fair  Recall:  [employer]  Fund of Knowledge:Fair  Language: Fair  Akathisia:  No  Handed:  Right  AIMS (if indicated):     Assets:  Communication Skills Desire for Improvement Housing Social Support  Sleep:  Number of Hours: 5.5   Musculoskeletal: Strength & Muscle Tone: within normal limits- walks with cane  Gait & Station: normal Patient leans: N/A  Current Medications: Current Facility-Administered Medications  Medication Dose Route Frequency Provider Last Rate Last Dose  . acetaminophen (  TYLENOL) tablet 650 mg  650 mg Oral Q6H PRN Kerry HoughSpencer E Simon, PA-C   650 mg at 07/06/13 1207  . alum & mag hydroxide-simeth (MAALOX/MYLANTA) 200-200-20 MG/5ML suspension 30 mL  30 mL Oral Q4H PRN Kerry HoughSpencer E Simon, PA-C      . amitriptyline (ELAVIL) tablet 100 mg  100 mg Oral QHS Kerry HoughSpencer E Simon, PA-C   100 mg at 07/06/13 2121  . citalopram (CELEXA) tablet 20 mg  20 mg Oral Daily Nehemiah MassedFernando Cobos, MD   20 mg at 07/07/13 0731  . diclofenac (VOLTAREN) EC tablet 75  mg  75 mg Oral BID Kerry HoughSpencer E Simon, PA-C   75 mg at 07/07/13 0731  . ferrous sulfate tablet 325 mg  325 mg Oral Q breakfast Kerry HoughSpencer E Simon, PA-C   325 mg at 07/07/13 0731  . hydrochlorothiazide (HYDRODIURIL) tablet 50 mg  50 mg Oral q morning - 10a Kerry HoughSpencer E Simon, PA-C   50 mg at 07/05/13 1009  . hydrOXYzine (ATARAX/VISTARIL) tablet 25 mg  25 mg Oral Q6H PRN Kerry HoughSpencer E Simon, PA-C   25 mg at 07/05/13 2242  . hydrOXYzine (ATARAX/VISTARIL) tablet 25 mg  25 mg Oral Once Kristeen MansFran E Hobson, NP      . lamoTRIgine (LAMICTAL) tablet 25 mg  25 mg Oral BID Beau FannyJohn C Withrow, FNP   25 mg at 07/07/13 0731  . lisinopril (PRINIVIL,ZESTRIL) tablet 10 mg  10 mg Oral q morning - 10a Kerry HoughSpencer E Simon, PA-C   10 mg at 07/05/13 1009  . magnesium hydroxide (MILK OF MAGNESIA) suspension 30 mL  30 mL Oral Daily PRN Kerry HoughSpencer E Simon, PA-C      . topiramate (TOPAMAX) tablet 75 mg  75 mg Oral BID Nehemiah MassedFernando Cobos, MD   75 mg at 07/07/13 [id number]    Lab Results:  No results found for this or any previous visit (from the past 48 hour(s)).  Physical Findings: AIMS: Facial and Oral Movements Muscles of Facial Expression: None, normal Lips and Perioral Area: None, normal Jaw: None, normal Tongue: None, normal,Extremity Movements Upper (arms, wrists, hands, fingers): None, normal Lower (legs, knees, ankles, toes): None, normal, Trunk Movements Neck, shoulders, hips: None, normal, Overall Severity Severity of abnormal movements (highest score from questions above): None, normal Incapacitation due to abnormal movements: None, normal Patient's awareness of abnormal movements (rate only patient's report): No Awareness, Dental Status Current problems with teeth and/or dentures?: No Does patient usually wear dentures?: No  CIWA:    COWS:    Assessment: Continues to gradually improve, and is becoming more future oriented, looking forward to discharge home soon. He did have a panic attack last night, possibly related to being in a  scenario where he might be asked to participate ( karaoke) , but by today he is calm, not anxious. Describes a history of kidney stones and describes passing one yesterday. Denies hematuria, and does not currently present with any significant pain or acute discomfort. Based on report of repeated kidney stones, depression/mood disorder, easily becoming fatigued, will order a  PTH level.   Treatment Plan Summary: Daily contact with patient to assess and evaluate symptoms and progress in treatment Medication management,  Continue  Celexa 10 mg daily.  Continue Lamictal 25 mg twice a day.  Continue Topamax to 75 mgrs BID   Order PTH level. Toradol 60mg  IM in a single dose for migraine and pain with urination.  Consider discharge home soon as he continues to improve/stabilize.  Medical Decision Making Problem Points:  Established problem, stable/improving (1), Review of last therapy session (1) and Review of psycho-social stressors (1) Data Points:  Review or order clinical lab tests (1) Review of new medications or change in dosage (2)  I certify that inpatient services furnished can reasonably be expected to improve the patient's condition.   Malachy Chamber S FNP-BC 07/07/2013, 10:30 AM

## 2013-07-07 NOTE — Progress Notes (Signed)
Patient ID: Zachary DodrillJames E [name], male   DOB: [birth year], 43 y.o.   MRN: [id number]   D: Pt continues to flat and depressed on the unit, however reports that he is ready for discharge. Pt did attend all groups and was able to engage in treatment. Pt did not take blood pressure medication this morning due to low blood pressure check.Pt rated his depression as a 4, and his helplessness/hopelessness as a 4. Pt reported being negative SI/HI, no AH/VH noted. A: 15 min checks continued for patient safety. R: Pt safety maintained.

## 2013-07-07 NOTE — Progress Notes (Signed)
Adult Psychoeducational Group Note  Date:  07/07/2013 Time:  10:07 PM  Group Topic/Focus:  Wrap-Up Group:   The focus of this group is to help patients review their daily goal of treatment and discuss progress on daily workbooks.  Participation Level:  Active  Participation Quality:  Attentive  Affect:  Appropriate  Cognitive:  Appropriate  Insight: Good  Engagement in Group:  Engaged  Modes of Intervention:  Clarification, Discussion and Exploration  Additional Comments:    Lorin MercyReives, Sincere Berlanga O 07/07/2013, 10:07 PM

## 2013-07-07 NOTE — Progress Notes (Signed)
D: Pt mood is depressed but brightens upon approach. States that he had a good day. He interacts well within the milieu. A: Support given. Verbalization encouraged. Medications given as prescribed. Pt encouraged to come to staff with any concerns.  R: Pt is receptive. No complaints of discomfort at this time. Q15 min safety checks maintained. Will continue to monitor pt.

## 2013-07-07 NOTE — [facility] Group Notes (Signed)
[facility] Group Notes:  (Clinical Social Work)  07/07/2013   1:15-2:15PM  Summary of Progress/Problems:  The main focus of today's process group was to   identify the patient's current support system and decide on other supports that can be put in place.  The picture on workbook was used to discuss why additional supports are needed.  An emphasis was placed on using counselor, doctor, therapy groups, 12-step groups, and problem-specific support groups to expand supports.   There was also an extensive discussion about what constitutes a healthy support versus an unhealthy support.  The patient expressed full comprehension of the concepts presented.  He said his motivation to add supports in order to change things in his life is 10 out of 10, because he is "tired of spinning my wheels."  He stated he came to the hospital voluntarily, is coming to groups, is changing his medications, is taking his meds.  He wants to change his outpatient doctor as he did not feel the old one listened to him, just kept increasing dosages despite him telling her it was not working.  Group encouraged him to continue to be a good self-advocate.   Type of Therapy:  Process Group  Participation Level:  Active  Participation Quality:  Attentive and Sharing  Affect:  Blunted, Depressed, Tearful  Cognitive:  Appropriate and Oriented  Insight:  Engaged  Engagement in Therapy:  Engaged  Modes of Intervention:  Education,  Support and ConAg[employer] FoodsProcessing  Imajean Mcdermid Grossman-Orr, LCSW 07/07/2013, 4:00pm

## 2013-07-07 NOTE — Progress Notes (Signed)
Psychoeducational Group Note  Date: 07/07/2013 Time:  0930  Group Topic/Focus:  Gratefulness:  The focus of this group is to help patients identify what two things they are most grateful for in their lives. What helps ground them and to center them on their work to their recovery.  Participation Level:  Active  Participation Quality:  Appropriate  Affect:  Appropriate  Cognitive:  Oriented  Insight:  Improving  Engagement in Group:  Engaged  Additional Comments:    Leandrea Ackley A  

## 2013-07-07 NOTE — Progress Notes (Signed)
Psychoeducational Group Note  Date:  07/07/2013 Time:  1015  Group Topic/Focus:  Making Healthy Choices:   The focus of this group is to help patients identify negative/unhealthy choices they were using prior to admission and identify positive/healthier coping strategies to replace them upon discharge.  Participation Level:  Active  Participation Quality:  Appropriate  Affect:  Appropriate  Cognitive:  Oriented  Insight:  Improving  Engagement in Group:  Engaged  Additional Comments:    Lenita Peregrina A 07/07/2013 

## 2013-07-07 NOTE — Progress Notes (Signed)
Patient ID: Zachary DodrillJames E [name], male   DOB: 10-20-1970, 43 y.o.   MRN: [id number] D)   Resting quietly tonight, eyes closed, resp reg, unlabored, no c/o's voiced. A)  Will continue to monitor for safety, continue POC R)  Safety maintained

## 2013-07-08 LAB — PTH, INTACT AND CALCIUM
CALCIUM TOTAL (PTH): 7.9 mg/dL — AB (ref 8.4–10.5)
PTH: 445.7 pg/mL — AB (ref 14.0–72.0)

## 2013-07-08 MED ORDER — FERROUS SULFATE 325 (65 FE) MG PO TABS: 325.0000 mg | ORAL_TABLET | Freq: Every day | ORAL | Status: DC

## 2013-07-08 MED ORDER — LISINOPRIL 10 MG PO TABS: 10.0000 mg | ORAL_TABLET | Freq: Every morning | ORAL | Status: DC

## 2013-07-08 MED ORDER — TOPIRAMATE 25 MG PO TABS: 75.0000 mg | ORAL_TABLET | Freq: Two times a day (BID) | ORAL | Status: DC

## 2013-07-08 MED ORDER — AMITRIPTYLINE HCL 100 MG PO TABS: 100.0000 mg | ORAL_TABLET | Freq: Every day | ORAL | Status: DC

## 2013-07-08 MED ORDER — LAMOTRIGINE 25 MG PO TABS: 25.0000 mg | ORAL_TABLET | Freq: Two times a day (BID) | ORAL | Status: DC

## 2013-07-08 MED ORDER — HYDROXYZINE HCL 25 MG PO TABS: 25.0000 mg | ORAL_TABLET | Freq: Four times a day (QID) | ORAL | Status: DC | PRN

## 2013-07-08 MED ORDER — HYDROCHLOROTHIAZIDE 25 MG PO TABS: 50.0000 mg | ORAL_TABLET | Freq: Every morning | ORAL | Status: DC

## 2013-07-08 MED ORDER — DICLOFENAC SODIUM 75 MG PO TBEC: 75.0000 mg | DELAYED_RELEASE_TABLET | Freq: Two times a day (BID) | ORAL | Status: DC

## 2013-07-08 MED ORDER — CITALOPRAM HYDROBROMIDE 20 MG PO TABS: 20.0000 mg | ORAL_TABLET | Freq: Every day | ORAL | Status: DC

## 2013-07-08 NOTE — [facility] Group Notes (Signed)
[facility]   07/08/2013 11:09 AM    Participation Quality:  Appropraite  Mood/Affect:  Appropriate  Depression Rating:  2  Anxiety Rating:  3  Thoughts of Suicide:  No  Will you contract for safety?   NA  Current AVH:  No  Plan for Discharge/Comments:  Patient attended discharge planning group and actively participated in group. [facility]  CSW provided all participants with daily workbook.   Transportation Means: Patient has transportation.   Supports:  Patient has a support system.   Karlin Heilman, Joesph JulyQuylle Hairston

## 2013-07-08 NOTE — Progress Notes (Signed)
Discharge note: Pt denies SI/HI/AVH at time of discharge. No depression verbalized. Pt received both written and verbal discharge instructions. Pt verbalized understanding of discharge instructions. Pt agreed to f/u appt and med regimen. Pt received sample meds, prescriptions and belongings. Pt safely left [facility].

## 2013-07-08 NOTE — Progress Notes (Signed)
[facility] :  Will you be returning to the same living situation after discharge: Yes,  Patient is returning to his home. At discharge, do you have transportation home?:Yes,  Patient has transportation. Do you have the ability to pay for your medications:No.  Patient needs assistance with indigent medications   Release of information consent forms completed and in the chart;  Patient's signature needed at discharge.  Patient to Follow up at: Follow-up Information   Follow up with Zachary [name] [facility] On 07/12/2013. (Friday, July 12, 2013 at [facility]2PM)    Contact information:   [address] [place], [place]  [address]  [phone]      Follow up with Zachary [name] - [facility] On 08/05/2013. (Monday, August 05, 2013 at [facility]6PM for medical problems)    Contact information:   [address] [place], [place]   [address]  [phone]      Follow up with Zachary PoagLeslie O'Neal, NP - [facility] On 07/26/2013. (Friday, July 26, 2013 at 3:45 PM)    Contact information:   [address] [place]   [address]  [phone]      Patient denies SI/HI:  Patient no longer endorsing SI/HI or other thoughts of self harm.   Safety Planning and Suicide Prevention discussed:  .Reviewed with all patients during discharge planning group   Zachary [name], Zachary [name] 07/08/2013, 1:26 PM

## 2013-07-08 NOTE — Progress Notes (Signed)
Patient ID: Zachary DodrillJames E Kertz, male   DOB: 10-Jan-1970, 43 y.o.   MRN: [id number] D: Took over patient's care @ 2330. Patient in bed sleeping. Respiration regular and unlabored. No sign of distress noted at this time A: 15 mins checks for safety. R: Patient is safe.

## 2013-07-08 NOTE — [facility] Suicide Risk Assessment (Signed)
   Demographic Factors:  Male, Caucasian and Unemployed  Total Time spent with patient: 30 minutes  Psychiatric Specialty Exam: Physical Exam  ROS  Blood pressure 115/81, pulse 92, temperature 97.5 F (36.4 C), temperature source Oral, resp. rate 20.There is no weight on file to calculate BMI.  General Appearance: Well Groomed  Patent attorneyye Contact::  Good  Speech:  Normal Rate  Volume:  Normal  Mood:  Euthymic  Affect:  Appropriate  Thought Process:  Linear  Orientation:  Full (Time, Place, and Person)  Thought Content:  no hallucinations and no delusions   Suicidal Thoughts:  No- denies any suicidal ideations  Homicidal Thoughts:  No- denies any thoughts of hurting anyone else   Memory:  NA  Judgement:  Good  Insight:  Fair  Psychomotor Activity:  Normal  Concentration:  Good  Recall:  Good  Fund of Knowledge:Good  Language: Good  Akathisia:  No  Handed:  Right  AIMS (if indicated):     Assets:  Communication Skills Desire for Improvement Resilience Social Support  Sleep:  Number of Hours: 5.75    Musculoskeletal: Strength & Muscle Tone: within normal limits- walks with a cane due to knee pain.  Gait & Station: normal Patient leans: N/A   Mental Status Per Nursing Assessment::   On Admission:  Suicidal ideation indicated by patient (on/off)  Current Mental Status by Physician: At this time patient calm, pleasant, well related,  not suicidal or homicidal or psychotic, future oriented Loss Factors: Decrease in vocational status and stressors at home, which he states have improved/resolved at this time  Historical Factors: Prior suicide attempts and Family history of suicide  Risk Reduction Factors:   Sense of responsibility to family, Living with another person, especially a relative, Positive social support, Positive therapeutic relationship and Positive coping skills or problem solving skills  Continued Clinical Symptoms:  Bipolar Disorder:   Depressive  phase  Cognitive Features That Contribute To Risk:  No gross cognitive deficits noted at this time  Suicide Risk:  Mild:  Suicidal ideation of limited frequency, intensity, duration, and specificity.  There are no identifiable plans, no associated intent, mild dysphoria and related symptoms, good self-control (both objective and subjective assessment), few other risk factors, and identifiable protective factors, including available and accessible social support.  Discharge Diagnoses:   AXIS I:  Bipolar, Depressed AXIS II:  Deferred AXIS III:   Past Medical History  Diagnosis Date  . Obesity   . Bipolar 1 disorder, depressed   . PTSD (post-traumatic stress disorder)   . Arthritis   . Fibromyalgia    AXIS IV:  problems related to social environment and chronic pain ( history of knee pain and fibromyalgia)  AXIS V:  61-70 mild symptoms- 60-65 upon discharge  Plan Of Care/Follow-up recommendations:  Activity:  As tolerated Diet:  Regular Tests:  NA Other:  As below  Is patient on multiple antipsychotic therapies at discharge:  No   Has Patient had three or more failed trials of antipsychotic monotherapy by history:  No  Recommended Plan for Multiple Antipsychotic Therapies: NA  Returning home , lives with girlfriend and roommate, whom he describes as supportive [facility] an appt on [facility]10[facility] Has an established therapist there ( Ms. Patton Sallesnita Jones ) as well as a PCP for medical issues  ( Dr. Christin FudgeSteele )  Leaving clinic in good spirits.    Mykaylah Ballman 07/08/2013, 12:18 PM

## 2013-07-10 NOTE — Progress Notes (Signed)
Patient Discharge Instructions:  After Visit Summary (AVS):   Faxed to:  07/10/13 Psychiatric Admission Assessment Note:   Faxed to:  07/10/13 Suicide Risk Assessment - Discharge Assessment:   Faxed to:  07/10/13 Faxed/Sent to the Next Level Care provider:  07/10/13 [facility] @ [phone] Jerelene ReddenSheena E [place], 07/10/2013, 4:01 PM

## 2013-07-17 ENCOUNTER — Encounter: Payer: Self-pay | Admitting: Psychiatry

## 2013-07-25 ENCOUNTER — Telehealth: Payer: Self-pay | Admitting: Psychiatry

## 2013-07-30 NOTE — Discharge Summary (Signed)
Physician Discharge Summary Note  Patient:  Zachary [name] is an 43 y.o., male MRN:  [id number] DOB:  1970-01-27 Patient phone:  [phone] (home)  Patient address:   [address] [place] [address],   Date of Admission:  07/01/2013 Date of Discharge:07/08/2013  Reason for Admission:  MDD with SI with OD  Discharge Diagnoses: Active Problems:   Bipolar 2 disorder, major depressive episode  Review of Systems  Constitutional: Negative.   Eyes: Negative.   Respiratory: Negative.   Cardiovascular: Negative.   Gastrointestinal: Negative.   Genitourinary: Negative.   Skin: Negative.   Neurological: Negative.   Endo/Heme/Allergies: Negative.   Psychiatric/Behavioral: Positive for depression. Negative for hallucinations, memory loss and substance abuse. Suicidal ideas: reports occurrence of over a decade, but no plans or intent. The patient is nervous/anxious.    General Appearance: Well Groomed   Patent attorney:: Good   Speech: Normal Rate   Volume: Normal   Mood: Euthymic   Affect: Appropriate   Thought Process: Linear   Orientation: Full (Time, Place, and Person)   Thought Content: no hallucinations and no delusions   Suicidal Thoughts: No- denies any suicidal ideations   Homicidal Thoughts: No- denies any thoughts of hurting anyone else   Memory: NA   Judgement: Good   Insight: Fair   Psychomotor Activity: Normal   Concentration: Good   Recall: Good   Fund of Knowledge:Good   Language: Good   Akathisia: No   Handed: Right   AIMS (if indicated):   Assets: Communication Skills  Desire for Improvement  Resilience  Social Support   Sleep: Number of Hours: 5.75    Musculoskeletal:  Strength & Muscle Tone: within normal limits- walks with a cane due to knee pain.  Gait & Station: normal  Patient leans: N/A   DSM5: Depressive Disorders:  Major Depressive Disorder - Severe (296.23) PTSD Axis Diagnosis:   AXIS I:  Bipolar, Depressed and  Generalized Anxiety Disorder AXIS II:  Deferred AXIS III:   Past Medical History  Diagnosis Date  . Obesity   . Bipolar 1 disorder, depressed   . PTSD (post-traumatic stress disorder)   . Arthritis   . Fibromyalgia    AXIS IV:  other psychosocial or environmental problems and problems related to social environment AXIS V:  61-70 mild symptoms  Level of Care:  OP  Hospital Course:   Pt is a 43 yr old male admitted for SI. Pt performed superficial cuts to his LFA with a razor. Pt reports having financial difficulties. Pt is currently filing for disability. Pt is also verbalizing family stress. He reports that " everything is hard to deal with" and that he is "losing control". Pt reports multiple suicide attempts in the past. One method includes overdosing. Pt is reporting anxiety, depression, si, and a decrease in his appetite and level of concentration. Pt reports that he has been non-compliant with his medications for the past two months. He verbalizes that his meds were not working. Pt reports the signigicant loss of his brother who died as a result of a suicide. He reports a hx of PTSD as a result of his brother's suicide. Pt admits to on/off SI. Pt verbally contracts for safety   During Hospitalization: Medications managed, psychoeducation, group and individual therapy. Pt currently denies SI, HI, and Psychosis. At discharge, pt rates anxiety and depression as minimal. Pt states that he does have a good supportive home environment and will followup with outpatient treatment. Affirms agreement  with medication regimen and discharge plan. Denies other physical and psychological concerns at time of discharge.    Consults:  psychiatry  Significant Diagnostic Studies:  None  Discharge Vitals:   Blood pressure 115/81, pulse 92, temperature 97.5 F (36.4 C), temperature source Oral, resp. rate 20. There is no weight on file to calculate BMI. Lab Results:   No results found for this or any  previous visit (from the past 72 hour(s)).  Physical Findings: AIMS: Facial and Oral Movements Muscles of Facial Expression: None, normal Lips and Perioral Area: None, normal Jaw: None, normal Tongue: None, normal,Extremity Movements Upper (arms, wrists, hands, fingers): None, normal Lower (legs, knees, ankles, toes): None, normal, Trunk Movements Neck, shoulders, hips: None, normal, Overall Severity Severity of abnormal movements (highest score from questions above): None, normal Incapacitation due to abnormal movements: None, normal Patient's awareness of abnormal movements (rate only patient's report): No Awareness, Dental Status Current problems with teeth and/or dentures?: No Does patient usually wear dentures?: No  CIWA:    COWS:     Psychiatric Specialty Exam: See Psychiatric Specialty Exam and Suicide Risk Assessment completed by Attending Physician prior to discharge.  Discharge destination:  Home  Is patient on multiple antipsychotic therapies at discharge:  No   Has Patient had three or more failed trials of antipsychotic monotherapy by history:  No  Recommended Plan for Multiple Antipsychotic Therapies: NA     Medication List    STOP taking these medications       calcium carbonate 500 MG chewable tablet  Commonly known as:  TUMS - dosed in mg elemental calcium     gabapentin 100 MG capsule  Commonly known as:  NEURONTIN     levETIRAcetam 750 MG tablet  Commonly known as:  KEPPRA     OLANZapine 10 MG tablet  Commonly known as:  ZYPREXA     propranolol 20 MG tablet  Commonly known as:  INDERAL      TAKE these medications     Indication   amitriptyline 100 MG tablet  Commonly known as:  ELAVIL  Take 1 tablet (100 mg total) by mouth at bedtime.   Indication:  Trouble Sleeping     citalopram 20 MG tablet  Commonly known as:  CELEXA  Take 1 tablet (20 mg total) by mouth daily.   Indication:  mood stabilization     diclofenac 75 MG EC tablet   Commonly known as:  VOLTAREN  Take 1 tablet (75 mg total) by mouth 2 (two) times daily.   Indication:  Rheumatoid Arthritis     ferrous sulfate 325 (65 FE) MG tablet  Take 1 tablet (325 mg total) by mouth daily with breakfast.   Indication:  Iron Deficiency     hydrochlorothiazide 25 MG tablet  Commonly known as:  HYDRODIURIL  Take 2 tablets (50 mg total) by mouth every morning.   Indication:  High Blood Pressure     hydrOXYzine 25 MG tablet  Commonly known as:  ATARAX/VISTARIL  Take 1 tablet (25 mg total) by mouth every 6 (six) hours as needed for anxiety.   Indication:  anxiety     lamoTRIgine 25 MG tablet  Commonly known as:  LAMICTAL  Take 1 tablet (25 mg total) by mouth 2 (two) times daily.   Indication:  mood stabilization     lisinopril 10 MG tablet  Commonly known as:  PRINIVIL,ZESTRIL  Take 1 tablet (10 mg total) by mouth every morning.   Indication:  High  Blood Pressure     topiramate 25 MG tablet  Commonly known as:  TOPAMAX  Take 3 tablets (75 mg total) by mouth 2 (two) times daily.   Indication:  Migraine Headache       Follow-up Information   Follow up with Patton SallesAnita Jones [facility] On 07/12/2013. (Friday, July 12, 2013 at [facility]2PM)    Contact information:   [address] [place], [place]  [address]  [phone]      Follow up with Dartha LodgeAnthony Steele - [facility] On 08/05/2013. (Monday, August 05, 2013 at [facility]6PM for medical problems)    Contact information:   [address] [place], [place]   [address]  [phone]      Follow up with Lauris PoagLeslie O'Neal, NP - Family Services On 07/26/2013. (Friday, July 26, 2013 at 3:45 PM)    Contact information:   [address] [place], [place]   [address]  [phone]      Follow-up recommendations:  Activity:  As tolerated Diet:  Heart healthy with low sodium  Comments:   Take all medications as prescribed. Keep all follow-up appointments as scheduled.  Do not consume alcohol or use illegal  drugs while on prescription medications. Report any adverse effects from your medications to your primary care provider promptly.  In the event of recurrent symptoms or worsening symptoms, call 911, a crisis hotline, or go to the nearest emergency department for evaluation.    Total Discharge Time:  Greater than 30 minutes.  Signed: Beau FannyWithrow, John C, FNP-BC 07/08/2013, 12:33 PM  Patient seen, Suicide Assessment Completed.  Disposition Plan Reviewed

## 2013-08-08 NOTE — Telephone Encounter (Signed)
Please note phone contact documentation. Task completed

## 2014-04-24 ENCOUNTER — Emergency Department (HOSPITAL_COMMUNITY)
Admission: EM | Admit: 2014-04-24 | Discharge: 2014-04-24 | Disposition: A | Discharge status: Home / Self Care | Payer: Self-pay | Attending: Emergency Medicine | Admitting: Emergency Medicine

## 2014-04-24 ENCOUNTER — Encounter (HOSPITAL_COMMUNITY): Payer: Self-pay | Admitting: Emergency Medicine

## 2014-04-24 ENCOUNTER — Emergency Department (HOSPITAL_COMMUNITY): Payer: Self-pay

## 2014-04-24 DIAGNOSIS — E669 Obesity, unspecified: Secondary | ICD-10-CM | POA: Insufficient documentation

## 2014-04-24 DIAGNOSIS — W19XXXA Unspecified fall, initial encounter: Secondary | ICD-10-CM

## 2014-04-24 DIAGNOSIS — Z791 Long term (current) use of non-steroidal anti-inflammatories (NSAID): Secondary | ICD-10-CM | POA: Insufficient documentation

## 2014-04-24 DIAGNOSIS — Y9289 Other specified places as the place of occurrence of the external cause: Secondary | ICD-10-CM | POA: Insufficient documentation

## 2014-04-24 DIAGNOSIS — S8991XA Unspecified injury of right lower leg, initial encounter: Secondary | ICD-10-CM | POA: Insufficient documentation

## 2014-04-24 DIAGNOSIS — F909 Attention-deficit hyperactivity disorder, unspecified type: Secondary | ICD-10-CM | POA: Insufficient documentation

## 2014-04-24 DIAGNOSIS — S4991XA Unspecified injury of right shoulder and upper arm, initial encounter: Secondary | ICD-10-CM | POA: Insufficient documentation

## 2014-04-24 DIAGNOSIS — W108XXA Fall (on) (from) other stairs and steps, initial encounter: Secondary | ICD-10-CM | POA: Insufficient documentation

## 2014-04-24 DIAGNOSIS — S79912A Unspecified injury of left hip, initial encounter: Secondary | ICD-10-CM | POA: Insufficient documentation

## 2014-04-24 DIAGNOSIS — Y998 Other external cause status: Secondary | ICD-10-CM | POA: Insufficient documentation

## 2014-04-24 DIAGNOSIS — F319 Bipolar disorder, unspecified: Secondary | ICD-10-CM | POA: Insufficient documentation

## 2014-04-24 DIAGNOSIS — S8992XA Unspecified injury of left lower leg, initial encounter: Secondary | ICD-10-CM | POA: Insufficient documentation

## 2014-04-24 DIAGNOSIS — Y9389 Activity, other specified: Secondary | ICD-10-CM | POA: Insufficient documentation

## 2014-04-24 DIAGNOSIS — Z79899 Other long term (current) drug therapy: Secondary | ICD-10-CM | POA: Insufficient documentation

## 2014-04-24 DIAGNOSIS — M7918 Myalgia, other site: Secondary | ICD-10-CM

## 2014-04-24 DIAGNOSIS — M1990 Unspecified osteoarthritis, unspecified site: Secondary | ICD-10-CM | POA: Insufficient documentation

## 2014-04-24 MED ORDER — METHOCARBAMOL 500 MG PO TABS: 1000.0000 mg | ORAL_TABLET | Freq: Four times a day (QID) | ORAL | Status: DC | PRN

## 2014-04-24 MED ORDER — HYDROCODONE-ACETAMINOPHEN 5-325 MG PO TABS: ORAL_TABLET | ORAL | Status: DC

## 2014-04-24 NOTE — ED Provider Notes (Signed)
CSN: [id number]     Arrival date & time 04/24/14  1300 History  This chart was scribed for non-physician practitioner, Wynetta EmeryNicole Donnis Phaneuf, working with Benjiman CoreNathan Pickering, MD by Richarda Overlieichard Holland, ED Scribe. This patient was seen in room WTR7/WTR7 and the patient's care was started at 1:20 PM.   Chief Complaint  Patient presents with  . Fall    fell yesterday and rolled  . Shoulder Pain    struck l/shoulder  . Knee Pain  . Hip Pain    l/hip pain   The history is provided by the patient. No language interpreter was used.   HPI Comments: Zachary DodrillJames E [name] is a 44 y.o. male with a history of bipolar 1 disorder, PTSD and arthritis who presents to the Emergency Department complaining of fall that occurred yesterday. Pt states that he fell down 3 steps and rolled onto his knees. He denies any head trauma or LOC. He now complains of left hip, bilateral knee and mid back pain between his shoulder blades. He states that he heard a "wet pop" in the middle of his back during the fall. Pt states that he can feel his left hip grinding with certain movements and states that certain movements worsens his pain. Pt reports that deep breathing, coughing or sneezing aggravates his pain. He rates his hip pain as a 8/10 at this time. Pt states he has tried naproxen with no relief. He states he is on no anticoagulation medicine. Pt reports no history of kidney problems. He denies neck pain.   Past Medical History  Diagnosis Date  . Obesity   . Bipolar 1 disorder, depressed   . PTSD (post-traumatic stress disorder)   . Arthritis   . Fibromyalgia    Past Surgical History  Procedure Laterality Date  . Gastric bypass    . Cholecystectomy    . Appendectomy    . Tympanostomy tube placement    . Adenoidectomy     Family History  Problem Relation Age of Onset  . Diabetes Father   . Cancer Father    History  Substance Use Topics  . Smoking status: Never Smoker   . Smokeless tobacco: Never Used  . Alcohol Use: No      Comment: none    Review of Systems  A complete 10 system review of systems was obtained and all systems are negative except as noted in the HPI and PMH.   Allergies  Codeine  Home Medications   Prior to Admission medications   Medication Sig Start Date End Date Taking? Authorizing Provider  amitriptyline (ELAVIL) 100 MG tablet Take 1 tablet (100 mg total) by mouth at bedtime. 07/08/13   Beau FannyJohn C Withrow, FNP  citalopram (CELEXA) 20 MG tablet Take 1 tablet (20 mg total) by mouth daily. 07/08/13   Beau FannyJohn C Withrow, FNP  diclofenac (VOLTAREN) 75 MG EC tablet Take 1 tablet (75 mg total) by mouth 2 (two) times daily. 07/08/13   Beau FannyJohn C Withrow, FNP  ferrous sulfate 325 (65 FE) MG tablet Take 1 tablet (325 mg total) by mouth daily with breakfast. 07/08/13   Beau FannyJohn C Withrow, FNP  hydrochlorothiazide (HYDRODIURIL) 25 MG tablet Take 2 tablets (50 mg total) by mouth every morning. 07/08/13   Beau FannyJohn C Withrow, FNP  HYDROcodone-acetaminophen (NORCO/VICODIN) 5-325 MG per tablet Take 1-2 tablets by mouth every 6 hours as needed for pain and/or cough. 04/24/14   Kazaria Gaertner, PA-C  hydrOXYzine (ATARAX/VISTARIL) 25 MG tablet Take 1 tablet (25 mg total) by mouth  every 6 (six) hours as needed for anxiety. 07/08/13   Beau Fanny, FNP  lamoTRIgine (LAMICTAL) 25 MG tablet Take 1 tablet (25 mg total) by mouth 2 (two) times daily. 07/08/13   Beau Fanny, FNP  lisinopril (PRINIVIL,ZESTRIL) 10 MG tablet Take 1 tablet (10 mg total) by mouth every morning. 07/08/13   Beau Fanny, FNP  methocarbamol (ROBAXIN) 500 MG tablet Take 2 tablets (1,000 mg total) by mouth 4 (four) times daily as needed (Pain). 04/24/14   Kelcy Baeten, PA-C  topiramate (TOPAMAX) 25 MG tablet Take 3 tablets (75 mg total) by mouth 2 (two) times daily. 07/08/13   Everardo All Withrow, FNP   BP 157/73 mmHg  Pulse 77  Temp(Src) 98 F (36.7 C) (Oral)  Resp 22  SpO2 97% Physical Exam  Constitutional: He is oriented to person, place, and time. He appears  well-developed and well-nourished.  Obese, patient has 2 canes. States that he normally endplates with one but he's needed to due to issues with his hip status post fall yesterday.  HENT:  Head: Normocephalic and atraumatic.  Mouth/Throat: Oropharynx is clear and moist.  No abrasions or contusions.   No hemotympanum, battle signs or raccoon's eyes  No crepitance or tenderness to palpation along the orbital rim.  EOMI intact with no pain or diplopia  No abnormal otorrhea or rhinorrhea. Nasal septum midline.  No intraoral trauma.  Eyes: Conjunctivae and EOM are normal. Pupils are equal, round, and reactive to light. Right eye exhibits no discharge. Left eye exhibits no discharge.  Neck: Normal range of motion. Neck supple. No tracheal deviation present.  No midline C-spine  tenderness to palpation or step-offs appreciated. Patient has full range of motion without pain.   Cardiovascular: Normal rate, regular rhythm and intact distal pulses.   Pulmonary/Chest: Effort normal and breath sounds normal. No respiratory distress. He has no wheezes. He has no rales. He exhibits no tenderness.  No TTP or crepitance  Abdominal: Soft. Bowel sounds are normal. He exhibits no distension and no mass. There is no tenderness. There is no rebound and no guarding.  Musculoskeletal: Normal range of motion. He exhibits tenderness. He exhibits no edema.       Arms:      Legs: Bilateral shoulders with good range of motion, can abduction greater than 90. Drop arm is negative. No deformity. Patient is neurovascularly intact.  Tenderness to palpation over the left greater trochanter reduced range of motion.  Bilateral knees with No deformity, erythema or abrasions. FROM. No effusion, ++crepitance. Anterior and posterior drawer show no abnormal laxity. Stable to valgus and varus stress. Joint lines are non-tender. Neurovascularly intact. Pt ambulates with non-antalgic gait.    Neurological: He is alert and  oriented to person, place, and time.  Strength 5/5 x4 extremities   Distal sensation intact  Skin: Skin is warm and dry.  Psychiatric: He has a normal mood and affect. His behavior is normal.  Nursing note and vitals reviewed.   ED Course  Procedures   DIAGNOSTIC STUDIES: Oxygen Saturation is 100% on RA, normal by my interpretation.    COORDINATION OF CARE: 1:26 PM Discussed treatment plan with pt at bedside and pt agreed to plan.   Labs Review Labs Reviewed - No data to display  Imaging Review Dg Chest 2 View  04/24/2014   CLINICAL DATA:  Acute upper back pain after fall down stairs at apartment complex. Initial encounter.  EXAM: CHEST  2 VIEW  COMPARISON:  None.  FINDINGS: The heart size and mediastinal contours are within normal limits. Both lungs are clear. No pneumothorax or pleural effusion is noted. The visualized skeletal structures are unremarkable.  IMPRESSION: No active cardiopulmonary disease.   Electronically Signed   By: Lupita Raider, M.D.   On: 04/24/2014 14:26   Dg Thoracic Spine 2 View  04/24/2014   CLINICAL DATA:  Missed a step coming down stairs and fell with resulting thoracic pain.  EXAM: THORACIC SPINE - 2 VIEW  COMPARISON:  None.  FINDINGS: Vertebral body alignment and heights are within normal. There is mild spondylosis throughout the thoracic spine. Pedicles are intact. There is no acute compression fracture or subluxation. There are surgical clips over the upper quadrants.  IMPRESSION: Mild spondylosis of the thoracic spine.  No acute findings.   Electronically Signed   By: Elberta Fortis M.D.   On: 04/24/2014 14:27   Dg Shoulder Right  04/24/2014   CLINICAL DATA:  Fall coming down stairs with bilateral shoulder pain.  EXAM: RIGHT SHOULDER - 2+ VIEW  COMPARISON:  None.  FINDINGS: Examination demonstrates no evidence of acute fracture or dislocation. Mild degenerative change of the spine.  IMPRESSION: No acute findings.   Electronically Signed   By: Elberta Fortis M.D.   On: 04/24/2014 14:28   Dg Shoulder Left  04/24/2014   CLINICAL DATA:  Acute left shoulder pain after fall down steps at apartment complex. Initial encounter.  EXAM: LEFT SHOULDER - 2+ VIEW  COMPARISON:  None.  FINDINGS: There is no evidence of fracture or dislocation. There is no evidence of arthropathy or other focal bone abnormality. Soft tissues are unremarkable.  IMPRESSION: Normal left shoulder.   Electronically Signed   By: Lupita Raider, M.D.   On: 04/24/2014 14:28   Dg Hip Unilat With Pelvis 2-3 Views Left  04/24/2014   CLINICAL DATA:  Acute left hip pain after fall down steps at apartment complex. Initial encounter.  EXAM: LEFT HIP (WITH PELVIS) 2-3 VIEWS  COMPARISON:  None.  FINDINGS: There is no evidence of hip fracture or dislocation. 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 No significant joint space narrowing or osteophyte formation is noted.  IMPRESSION: No fracture or dislocation is noted. 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   Electronically Signed   By: Lupita Raider, M.D.   On: 04/24/2014 14:32     EKG Interpretation None      MDM   Final diagnoses:  Fall  Musculoskeletal pain   Filed Vitals:   04/24/14 1307 04/24/14 1510  BP: 157/94 157/73  Pulse: 96 77  Temp: 98.2 F (36.8 C) 98 F (36.7 C)  TempSrc: Oral Oral  Resp: 20 22  SpO2: 100% 97%    Zachary [name] is a pleasant 44 y.o. male presenting with pain status post slip and fall down 3 steps. Patient is ambulatory but with great pain. 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 Discussed case with attending MD who agrees with plan of outpatient referral to orthopedics and stability to d/c to home.   Patient is driving home and pain control choices are limited in the ED  Evaluation does not show  pathology that would require ongoing emergent intervention or inpatient treatment. Pt is hemodynamically stable and mentating appropriately. Discussed findings and plan with patient/guardian, who agrees with care plan. All questions  answered. Return precautions discussed and outpatient follow up given.   Discharge Medication List as of 04/24/2014  3:04 PM    START taking these medications   Details  HYDROcodone-acetaminophen (NORCO/VICODIN) 5-325 MG per tablet Take 1-2 tablets by mouth every 6 hours as needed for pain and/or cough., Print    methocarbamol (ROBAXIN) 500 MG tablet Take 2 tablets (1,000 mg total) by mouth 4 (four) times daily as needed (Pain)., Starting 04/24/2014, Until Discontinued, Print        I personally performed the services described in this documentation, which was scribed in my presence. The recorded information has been reviewed and is accurate.     Wynetta Emery, PA-C 04/24/14 2027  Benjiman Core, MD 04/25/14 [phone]

## 2014-04-24 NOTE — ED Notes (Signed)
Pt stated that he is having l/shoulder, l/hip and l/knee pain after falling and rolling onto knees . He tripped and fell down 3 steps. Denies head injury, denies dizziness

## 2014-04-24 NOTE — Discharge Instructions (Signed)
Take robaxin and/or Vicodin for breakthrough pain, do not drink alcohol, drive, care for children or perfom other critical tasks while taking robaxin and/or Vicodin .  Please follow with your primary care doctor in the next 2 days for a check-up. They must obtain records for further management.   Do not hesitate to return to the Emergency Department for any new, worsening or concerning symptoms.   Do not hesitate to return to the emergency room for any new, worsening or concerning symptoms.  Please obtain primary care using resource guide below. But the minute you were seen in the emergency room and that they will need to obtain records for further outpatient management.  Ermelinda Das  Emergency Department Resource Guide 1) Find a Doctor and Pay Out of Pocket Although you won't have to find out who is covered by your insurance plan, it is a good idea to ask around and get recommendations. You will then need to call the office and see if the doctor you have chosen will accept you as a new patient and what types of options they offer for patients who are self-pay. Some doctors offer discounts or will set up payment plans for their patients who do not have insurance, but you will need to ask so you aren't surprised when you get to your appointment.  2) Contact Your Local Health Department Not all health departments have doctors that can see patients for sick visits, but many do, so it is worth a call to see if yours does. If you don't know where your local health department is, you can check in your phone book. The CDC also has a tool to help you locate your state's health department, and many state websites also have listings of all of their local health departments.  3) Find a Walk-in Clinic If your illness is not likely to be very severe or complicated, you may want to try a walk in clinic. These are popping up all over the country in pharmacies, drugstores, and shopping centers. They're usually staffed  by nurse practitioners or physician assistants that have been trained to treat common illnesses and complaints. They're usually fairly quick and inexpensive. However, if you have serious medical issues or chronic medical problems, these are probably not your best option.  No Primary Care Doctor: - Call Health Connect at  [phone] - they can help you locate a primary care doctor that  accepts your insurance, provides certain services, etc. - Physician Referral Service- [phone]  Chronic Pain Problems: Organization         Address  Phone   Notes  [facility]  [phone] Patients need to be referred by their primary care doctor.   Medication Assistance: Organization         Address  Phone   Notes  [facility] Medication [facility] [address] [place] [place], [place] [address] [phone] --Must be a resident of [facility] -- Must have NO insurance coverage whatsoever (no Medicaid/ Medicare, etc.) -- The pt. MUST have a primary care doctor that directs their care regularly and follows them in the community   [facility]  [phone]   [employer]  [phone]    Agencies that provide inexpensive medical care: Organization         Address  Phone   Notes  [name]  [phone]   [name]    [phone]   [facility]  [address] [place], [place] [address] [phone]   [facility] [address] [place] [place] [phone]   Planned Parenthood    [phone]   [facility]    [phone]   [facility]  [address], [place] Phone:  [phone], Fax:  [phone] Hours of Operation:  9 am - 6 pm, M-F.  Also accepts Medicaid/Medicare and self-pay.  [facility]  [address], [place] Phone: [phone], Fax: [phone]. Hours of Operation:   8:30 am - 5:30 pm, M-F.  Also accepts Medicaid and self-pay.  [facility] [place] [address], [place] Phone: [phone]   [facility] [address] Natasha Bence [place], [place] [phone], Ext. [phone] Mondays & Thursdays: 7-9 AM.  First 15 patients are seen on a first come, first serve basis.    Medicaid-accepting [facility] Providers:  Organization         Address  Phone   Notes  [facility] [address], [place] [phone] Also accepts self-pay patients.  [facility] [address] Laurell Josephs [place], [place]  [phone]   [facility] [address], [place] [phone]   [facility] [address], [place] [phone]   Renaye Rakers [address], [place]   [phone] Only accepts [place] Access [place] patients after they have their name applied to their card.   Self-Pay (no insurance) in [facility]:  Organization         Address  Phone   Notes  Sickle Cell Patients, [facility] [address] [place], [place] [phone]   [facility] [address] [place], [place] [phone]   [name] [place]  [address], [place] [phone]   [facility]/Dr. Osei-Bonsu  [address], [place] or [address], [place] [phone] Phone number for both [place] and [place] locations is the same.  [facility] [address], [place] [phone]   [facility] [address], [place] or [address] [phone]   [facility] [address], [place] [phone], phone; [phone], fax Sees patients 1st and 3rd Saturday of every month.  Must not qualify for public or private insurance (i.e. Medicaid, Medicare, [place] Health  Choice, Veterans' Benefits)  Household income should be no more than 200% of the poverty level The clinic cannot treat you if you are pregnant or think you are pregnant  Sexually transmitted diseases are not treated at the clinic.    Dental Care: Organization         Address  Phone  Notes  [facility] [address] [place], [place] [phone] Accepts children up to age 67 who are enrolled in [place] or [place] Health Choice; pregnant women with a Medicaid card; and children who have applied for Medicaid or [place] Health Choice, but were declined, whose parents can pay a reduced fee at time of service.  [facility] Department of [facility]  [address], [place] [phone] Accepts children up to age 62 who are enrolled in [place]  or [place] Health Choice; pregnant women with a Medicaid card; and children who have applied for Medicaid or [place] Health Choice, but were declined, whose parents can pay a reduced fee at time of service.  [facility]  [address], [place] [phone] Patients are seen by appointment only. Walk-ins are not accepted. [facility] will see patients 45 years of age and older. Monday - Tuesday (8am-5pm) Most Wednesdays (8:30-5pm) $30 per visit, cash only  [facility]  [address], [facility] [place] [phone] Patients are seen by appointment only. Walk-ins are not accepted. [facility] will see patients 45 years of age and older. One Wednesday Evening (Monthly: Volunteer Based).  $30 per visit, cash only  [facility]  [phone]) [phone] for adults; Children under age 824, call Graduate Pediatric Dentistry at [phone]. Children aged 844-14, please call [phone]) [phone] to request a pediatric application.  Dental services are provided in all areas of dental care including fillings, crowns and bridges, complete  and partial dentures, implants, gum treatment, root canals, and extractions. Preventive care is also provided. Treatment is provided to both adults and children. Patients are selected via a lottery and there is often a waiting list.   [facility] [address], [place]  [phone]) [phone] [email or web address]   [facility] [address], [place], [place] [phone], Ext. [phone] Second and Fourth Thursday of each month, opens at 6:30 AM; Clinic ends at 9 AM.  Patients are seen on a first-come first-served basis, and a limited number are seen during each clinic.   [facility]  [address], [place], [place] [phone]   Eligibility Requirements You must have lived in [place], [place], or [place] counties for at least the last three months.   You cannot be eligible for state or federal sponsored [place] Cityhealthcare insurance, including CIGNAVeterans Administration, [place]icaid, or [employer]icare.   You generally cannot be eligible for healthcare insurance through your employer.    How to apply: Eligibility screenings are held every Tuesday and Wednesday afternoon from 1:00 pm until 4:00 pm. You do not need an appointment for the interview!  [facility] [address], [place], [place] [phone]   [facility]  [phone]   [facility]  [phone]   [facility]  [phone]    Behavioral Health Resources in the Community: Intensive Outpatient Programs Organization         Address  Phone  Notes  [facility] [address], [place], [place] [phone]   [facility] [address], [place], [place] [phone]   [facility] [address], [place], [place]  [phone]   [facility] [address],  [place], [place] [phone] or [phone]   Substance Abuse Resources Organization          Address  Phone  Notes  [facility]  [phone]   [facility]  [phone]   [facility]  [phone]   [facility]  [phone]   Residential & Outpatient Substance Abuse Program  [phone]   Psychological Services Organization         Address  Phone  Notes  [facility]  [phone]- [phone]   [facility]  [phone]   [facility] [address], [place] [phone] or [phone]  Mobile Crisis Teams °Organization         Address  Phone  Notes  °[facility], Mobile Crisis Care Unit  [phone]   °Assertive °[facility]otherapeutic Services ° [address] [place], [place] [phone]   °Sharon DeEsch [address] °[place] [phone]   ° °Self-Help/Support Groups °Organization         Address  Phone             Notes  °[facility] [place] - variety of support groups  336- [phone] Call for more information  °Narcotics Anonymous (NA), [facility] [address], °[place]  2 meetings at this location  ° °Residential Treatment Programs °Organization         Address  Phone  Notes  °[facility] [address],    °[place]  [phone]   °[facility] ° [address], [place], [place] [phone]   °[facility] [address], [place] [phone] Admissions: 8am-3pm M-F  °[facility] [address]    °[place], [place] [phone]   °[facility] [address], [place], [place] [phone]   °[facility] [address]  °[place], [place] [phone]   °[facility] - Intensive Outpatient [address], [place], [place] [phone]   °[facility] ([facility] [address]  °[place], [place] 1-877-615-2722 or [phone]   °[facility] ([facility]) [address] [place], [place]  [phone] Accepts Medicaid  °[facility] [address]  °[place] 1-800-659-3381 Substance Abuse/Addiction Treatment  ° °[place] Behavior[facility] Health Resources °Organization         Address  Phone  Notes  °[facility]  ([phone]) [phone]   °Julie Brannon, PhD [address] [place], [place]   ([phone]) [phone] or (336) [phone]   °[facility]   [address] °[place], [place] ([phone]) [phone]   °[facility] [address], [place], [place] (336) [phone] Insurance/Medicaid/sponsorship through [facility]  °[facility] [address]                                    [place], [place] (336) [phone] Therapy/tele-psych/case  °[facility] [address]  ° [place], [place] (336) [phone]    °Dr. Arfeen  ([phone]) [phone]   °[facility] [place]  [employer] [place] [facility] 1) [address], [place] °2) [address], [place] °3)  [address], [place] (336) [phone] °(336) [phone] ° °(336) [phone]   °[place] Child Abuse Hotline (336) 342-1394 or (336) [phone] (After Hours)    ° ° ° ° °

## 2014-06-11 ENCOUNTER — Encounter (HOSPITAL_COMMUNITY): Payer: Self-pay | Admitting: Emergency Medicine

## 2014-06-11 ENCOUNTER — Emergency Department (HOSPITAL_COMMUNITY): Payer: Self-pay

## 2014-06-11 ENCOUNTER — Emergency Department (HOSPITAL_COMMUNITY)
Admission: EM | Admit: 2014-06-11 | Discharge: 2014-06-11 | Disposition: A | Discharge status: Home / Self Care | Payer: Self-pay | Attending: Emergency Medicine | Admitting: Emergency Medicine

## 2014-06-11 DIAGNOSIS — Y939 Activity, unspecified: Secondary | ICD-10-CM | POA: Insufficient documentation

## 2014-06-11 DIAGNOSIS — Y929 Unspecified place or not applicable: Secondary | ICD-10-CM | POA: Insufficient documentation

## 2014-06-11 DIAGNOSIS — W1839XA Other fall on same level, initial encounter: Secondary | ICD-10-CM | POA: Insufficient documentation

## 2014-06-11 DIAGNOSIS — W19XXXA Unspecified fall, initial encounter: Secondary | ICD-10-CM

## 2014-06-11 DIAGNOSIS — M25561 Pain in right knee: Secondary | ICD-10-CM

## 2014-06-11 DIAGNOSIS — S8992XA Unspecified injury of left lower leg, initial encounter: Secondary | ICD-10-CM | POA: Insufficient documentation

## 2014-06-11 DIAGNOSIS — M25562 Pain in left knee: Secondary | ICD-10-CM

## 2014-06-11 DIAGNOSIS — F319 Bipolar disorder, unspecified: Secondary | ICD-10-CM | POA: Insufficient documentation

## 2014-06-11 DIAGNOSIS — M1990 Unspecified osteoarthritis, unspecified site: Secondary | ICD-10-CM | POA: Insufficient documentation

## 2014-06-11 DIAGNOSIS — M797 Fibromyalgia: Secondary | ICD-10-CM | POA: Insufficient documentation

## 2014-06-11 DIAGNOSIS — Z791 Long term (current) use of non-steroidal anti-inflammatories (NSAID): Secondary | ICD-10-CM | POA: Insufficient documentation

## 2014-06-11 DIAGNOSIS — Z79899 Other long term (current) drug therapy: Secondary | ICD-10-CM | POA: Insufficient documentation

## 2014-06-11 DIAGNOSIS — E669 Obesity, unspecified: Secondary | ICD-10-CM | POA: Insufficient documentation

## 2014-06-11 DIAGNOSIS — S8991XA Unspecified injury of right lower leg, initial encounter: Secondary | ICD-10-CM | POA: Insufficient documentation

## 2014-06-11 DIAGNOSIS — G8929 Other chronic pain: Secondary | ICD-10-CM | POA: Insufficient documentation

## 2014-06-11 DIAGNOSIS — Y990 Civilian activity done for income or pay: Secondary | ICD-10-CM | POA: Insufficient documentation

## 2014-06-11 DIAGNOSIS — S63501A Unspecified sprain of right wrist, initial encounter: Secondary | ICD-10-CM | POA: Insufficient documentation

## 2014-06-11 MED ORDER — HYDROCODONE-ACETAMINOPHEN 5-325 MG PO TABS
2.0000 | ORAL_TABLET | Freq: Once | ORAL | Status: AC
  Administered 2014-06-11: 2 via ORAL
  Filled 2014-06-11: qty 2

## 2014-06-11 MED ORDER — HYDROCODONE-ACETAMINOPHEN 5-325 MG PO TABS: 1.0000 | ORAL_TABLET | Freq: Four times a day (QID) | ORAL | Status: DC | PRN

## 2014-06-11 NOTE — ED Provider Notes (Signed)
CSN: [id number]     Arrival date & time 06/11/14  1655 History  This chart was scribed for non-physician practitioner, Roxy Horseman, PA-C working with Azalia Bilis, MD by Doreatha Martin, ED scribe. This patient was seen in room WTR9/WTR9 and the patient's care was started at 5:17 PM    Chief Complaint  Patient presents with  . Fall  . Knee Pain  . Hand Pain   The history is provided by the patient. No language interpreter was used.    HPI Comments: Zachary [name] is a 44 y.o. male with a history of falling and chronic knee pain who presents to the Emergency Department with a chief complaint of a mechanical fall that occurred when his walker got caught in a crack and caused him to fall, landing on his knees and right wrist last night. He reports associated moderate, gradual onset bilateral knee pain, swelling, and right wrist pain. He states associated calor on the right wrist. He reports that pain is worsened with movement. Pt is ambulatory. He denies numbness.   Past Medical History  Diagnosis Date  . Obesity   . Bipolar 1 disorder, depressed   . PTSD (post-traumatic stress disorder)   . Arthritis   . Fibromyalgia    Past Surgical History  Procedure Laterality Date  . Gastric bypass    . Cholecystectomy    . Appendectomy    . Tympanostomy tube placement    . Adenoidectomy     Family History  Problem Relation Age of Onset  . Diabetes Father   . Cancer Father    History  Substance Use Topics  . Smoking status: Never Smoker   . Smokeless tobacco: Never Used  . Alcohol Use: No     Comment: none    Review of Systems  Musculoskeletal: Positive for joint swelling and arthralgias.  Skin: Negative for wound.  Neurological: Negative for numbness.    Allergies  Codeine  Home Medications   Prior to Admission medications   Medication Sig Start Date End Date Taking? Authorizing Provider  amitriptyline (ELAVIL) 100 MG tablet Take 1 tablet (100 mg total) by mouth at bedtime.  07/08/13   Beau Fanny, FNP  citalopram (CELEXA) 20 MG tablet Take 1 tablet (20 mg total) by mouth daily. 07/08/13   Beau Fanny, FNP  diclofenac (VOLTAREN) 75 MG EC tablet Take 1 tablet (75 mg total) by mouth 2 (two) times daily. 07/08/13   Beau Fanny, FNP  ferrous sulfate 325 (65 FE) MG tablet Take 1 tablet (325 mg total) by mouth daily with breakfast. 07/08/13   Beau Fanny, FNP  hydrochlorothiazide (HYDRODIURIL) 25 MG tablet Take 2 tablets (50 mg total) by mouth every morning. 07/08/13   Beau Fanny, FNP  HYDROcodone-acetaminophen (NORCO/VICODIN) 5-325 MG per tablet Take 1-2 tablets by mouth every 6 hours as needed for pain and/or cough. 04/24/14   Nicole Pisciotta, PA-C  hydrOXYzine (ATARAX/VISTARIL) 25 MG tablet Take 1 tablet (25 mg total) by mouth every 6 (six) hours as needed for anxiety. 07/08/13   Beau Fanny, FNP  lamoTRIgine (LAMICTAL) 25 MG tablet Take 1 tablet (25 mg total) by mouth 2 (two) times daily. 07/08/13   Beau Fanny, FNP  lisinopril (PRINIVIL,ZESTRIL) 10 MG tablet Take 1 tablet (10 mg total) by mouth every morning. 07/08/13   Beau Fanny, FNP  methocarbamol (ROBAXIN) 500 MG tablet Take 2 tablets (1,000 mg total) by mouth 4 (four) times daily as needed (Pain). 04/24/14  Nicole Pisciotta, PA-C  topiramate (TOPAMAX) 25 MG tablet Take 3 tablets (75 mg total) by mouth 2 (two) times daily. 07/08/13   Everardo All [place]row, FNP   BP 172/86 mmHg  Pulse 86  Temp(Src) 97.9 F (36.6 C) (Oral)  Resp 19  Ht  (1.753 m)  Wt 340 lb (154.223 kg)  BMI 50.19 kg/m2  SpO2 98% Physical Exam  Constitutional: He is oriented to person, place, and time. He appears well-developed and well-nourished.  Obese  HENT:  Head: Normocephalic and atraumatic.  Eyes: Conjunctivae and EOM are normal. Pupils are equal, round, and reactive to light.  Neck: Normal range of motion. Neck supple.  Cardiovascular: Normal rate and intact distal pulses.   Pulmonary/Chest: Effort normal. No respiratory  distress.  Abdominal: He exhibits no distension.  Musculoskeletal: Normal range of motion.  No bony abnormality or deformity of knees, range of motion and strength is normal  Right wrist mildly tender to palpation, no bony abnormality or deformity, range of motion strength is normal  Neurological: He is alert and oriented to person, place, and time.  Skin: Skin is warm and dry.  Psychiatric: He has a normal mood and affect. His behavior is normal.  Nursing note and vitals reviewed.   ED Course  Procedures (including critical care time) DIAGNOSTIC STUDIES: Oxygen Saturation is 98% on RA, normal by my interpretation.    COORDINATION OF CARE: 5:23 PM Discussed treatment plan with pt at bedside and pt agreed to plan.   Labs Review Labs Reviewed - No data to display  Imaging Review Dg Wrist Complete Right  06/11/2014   CLINICAL DATA:  Injury last night  EXAM: RIGHT WRIST - COMPLETE 3+ VIEW  COMPARISON:  None.  FINDINGS: No acute fracture.  No dislocation.  Unremarkable soft tissues.  IMPRESSION: No acute bony pathology.   Electronically Signed   By: Jolaine Click M.D.   On: 06/11/2014 18:11   Dg Knee Complete 4 Views Left  06/11/2014   CLINICAL DATA:  Post fall wall walking with walker, now with bilateral knee pain. Initial encounter.  EXAM: LEFT KNEE - COMPLETE 4+ VIEW  COMPARISON:  None.  FINDINGS: No fracture or dislocation. Mild-to-moderate tricompartmental degenerative change of the knee, worse within the medial compartment with joint space loss, subchondral sclerosis and osteophytosis. There is spurring of the tibial spines. No evidence of chondrocalcinosis. No joint effusion. Regional soft tissues are normal. No radiopaque foreign body.  IMPRESSION: 1. No acute findings. 2. Mild to moderate tricompartmental degenerative change of the knee, worse within the medial compartment.   Electronically Signed   By: Simonne Come M.D.   On: 06/11/2014 18:07   Dg Knee Complete 4 Views  Right  06/11/2014   CLINICAL DATA:  Fall last night  EXAM: RIGHT KNEE - COMPLETE 4+ VIEW  COMPARISON:  None.  FINDINGS: Severe tricompartment osteoarthritic change. Osteopenia. No acute fracture. No dislocation.  IMPRESSION: No acute bony pathology.   Electronically Signed   By: Jolaine Click M.D.   On: 06/11/2014 18:10     EKG Interpretation None      MDM   Final diagnoses:  Fall  Wrist sprain, right, initial encounter  Arthralgia of both knees    Patient with mechanical fall. He is ambulatory in the ED. When films are negative. Will discharge home with a wrist splint and a few pain pills. Recommend orthopedic/primary care follow-up. Recommend rice therapy. Pt understands and agrees to plan. He is stable and ready for discharge.  I personally performed the services described in this documentation, which was scribed in my presence. The recorded information has been reviewed and is accurate.    Roxy Horsemanobert Dequavius Kuhner, PA-C 06/11/14 1820  Azalia BilisKevin Campos, MD 06/12/14 [phone]

## 2014-06-11 NOTE — ED Notes (Signed)
Pt was using his walker last night, when got caught in crack between concrete and pt fell landing on his knees and hands. Pt c/o bilat knee pain and right hand pain.  Pt has two canes that he used to help himself transfer from wheelchair to exam chair.

## 2014-06-11 NOTE — Discharge Instructions (Signed)
Arthralgia °Your caregiver has diagnosed you as suffering from an arthralgia. Arthralgia means there is pain in a joint. This can come from many reasons including: °· Bruising the joint which causes soreness (inflammation) in the joint. °· Wear and tear on the joints which occur as we grow older (osteoarthritis). °· Overusing the joint. °· Various forms of arthritis. °· Infections of the joint. °Regardless of the cause of pain in your joint, most of these different pains respond to anti-inflammatory drugs and rest. The exception to this is when a joint is infected, and these cases are treated with antibiotics, if it is a bacterial infection. °HOME CARE INSTRUCTIONS  °· Rest the injured area for as long as directed by your caregiver. Then slowly start using the joint as directed by your caregiver and as the pain allows. Crutches as directed may be useful if the ankles, knees or hips are involved. If the knee was splinted or casted, continue use and care as directed. If an stretchy or elastic wrapping bandage has been applied today, it should be removed and re-applied every 3 to 4 hours. It should not be applied tightly, but firmly enough to keep swelling down. Watch toes and feet for swelling, bluish discoloration, coldness, numbness or excessive pain. If any of these problems (symptoms) occur, remove the ace bandage and re-apply more loosely. If these symptoms persist, contact your caregiver or return to this location. °· For the first 24 hours, keep the injured extremity elevated on pillows while lying down. °· Apply ice for 15-20 minutes to the sore joint every couple hours while awake for the first half day. Then 03-04 times per day for the first 48 hours. Put the ice in a plastic bag and place a towel between the bag of ice and your skin. °· Wear any splinting, casting, elastic bandage applications, or slings as instructed. °· Only take over-the-counter or prescription medicines for pain, discomfort, or fever as  directed by your caregiver. Do not use aspirin immediately after the injury unless instructed by your physician. Aspirin can cause increased bleeding and bruising of the tissues. °· If you were given crutches, continue to use them as instructed and do not resume weight bearing on the sore joint until instructed. °Persistent pain and inability to use the sore joint as directed for more than 2 to 3 days are warning signs indicating that you should see a caregiver for a follow-up visit as soon as possible. Initially, a hairline fracture (break in bone) may not be evident on X-rays. Persistent pain and swelling indicate that further evaluation, non-weight bearing or use of the joint (use of crutches or slings as instructed), or further X-rays are indicated. X-rays may sometimes not show a small fracture until a week or 10 days later. Make a follow-up appointment with your own caregiver or one to whom we have referred you. A radiologist (specialist in reading X-rays) may read your X-rays. Make sure you know how you are to obtain your X-ray results. Do not assume everything is normal if you do not hear from us. °SEEK MEDICAL CARE IF: °Bruising, swelling, or pain increases. °SEEK IMMEDIATE MEDICAL CARE IF:  °· Your fingers or toes are numb or blue. °· The pain is not responding to medications and continues to stay the same or get worse. °· The pain in your joint becomes severe. °· You develop a fever over 102° F (38.9° C). °· It becomes impossible to move or use the joint. °MAKE SURE YOU:  °·   Understand these instructions.  Will watch your condition.  Will get help right away if you are not doing well or get worse. Document Released: 12/20/2004 Document Revised: 03/14/2011 Document Reviewed: 08/08/2007 [facility] Patient Information 2015 [place], [place]. This information is not intended to replace advice given to you by your health care provider. Make sure you discuss any questions you have with your health care  provider. Wrist Pain Wrist injuries are frequent in adults and children. A sprain is an injury to the ligaments that hold your bones together. A strain is an injury to muscle or muscle cord-like structures (tendons) from stretching or pulling. Generally, when wrists are moderately tender to touch following a fall or injury, a break in the bone (fracture) may be present. Most wrist sprains or strains are better in 3 to 5 days, but complete healing may take several weeks. HOME CARE INSTRUCTIONS   Put ice on the injured area.  Put ice in a plastic bag.  Place a towel between your skin and the bag.  Leave the ice on for 15-20 minutes, 3-4 times a day, for the first 2 days, or as directed by your health care provider.  Keep your arm raised above the level of your heart whenever possible to reduce swelling and pain.  Rest the injured area for at least 48 hours or as directed by your health care provider.  If a splint or elastic bandage has been applied, use it for as long as directed by your health care provider or until seen by a health care provider for a follow-up exam.  Only take over-the-counter or prescription medicines for pain, discomfort, or fever as directed by your health care provider.  Keep all follow-up appointments. You may need to follow up with a specialist or have follow-up X-rays. Improvement in pain level is not a guarantee that you did not fracture a bone in your wrist. The only way to determine whether or not you have a broken bone is by X-ray. SEEK IMMEDIATE MEDICAL CARE IF:   Your fingers are swollen, very red, white, or cold and blue.  Your fingers are numb or tingling.  You have increasing pain.  You have difficulty moving your fingers. MAKE SURE YOU:   Understand these instructions.  Will watch your condition.  Will get help right away if you are not doing well or get worse. Document Released: 09/29/2004 Document Revised: 12/25/2012 Document Reviewed:  02/10/2010 [facility] Patient Information 2015 [place], [place]. This information is not intended to replace advice given to you by your health care provider. Make sure you discuss any questions you have with your health care provider.

## 2014-07-15 ENCOUNTER — Encounter (HOSPITAL_COMMUNITY): Payer: Self-pay | Admitting: Emergency Medicine

## 2014-07-15 ENCOUNTER — Emergency Department (HOSPITAL_COMMUNITY)
Admission: EM | Admit: 2014-07-15 | Discharge: 2014-07-15 | Disposition: A | Discharge status: Home / Self Care | Payer: Self-pay | Attending: Emergency Medicine | Admitting: Emergency Medicine

## 2014-07-15 DIAGNOSIS — M797 Fibromyalgia: Secondary | ICD-10-CM | POA: Insufficient documentation

## 2014-07-15 DIAGNOSIS — F319 Bipolar disorder, unspecified: Secondary | ICD-10-CM | POA: Insufficient documentation

## 2014-07-15 DIAGNOSIS — Y929 Unspecified place or not applicable: Secondary | ICD-10-CM | POA: Insufficient documentation

## 2014-07-15 DIAGNOSIS — L089 Local infection of the skin and subcutaneous tissue, unspecified: Secondary | ICD-10-CM

## 2014-07-15 DIAGNOSIS — Z79899 Other long term (current) drug therapy: Secondary | ICD-10-CM | POA: Insufficient documentation

## 2014-07-15 DIAGNOSIS — Y999 Unspecified external cause status: Secondary | ICD-10-CM | POA: Insufficient documentation

## 2014-07-15 DIAGNOSIS — W57XXXA Bitten or stung by nonvenomous insect and other nonvenomous arthropods, initial encounter: Secondary | ICD-10-CM | POA: Insufficient documentation

## 2014-07-15 DIAGNOSIS — Y9389 Activity, other specified: Secondary | ICD-10-CM | POA: Insufficient documentation

## 2014-07-15 DIAGNOSIS — Z9884 Bariatric surgery status: Secondary | ICD-10-CM | POA: Insufficient documentation

## 2014-07-15 DIAGNOSIS — S60461A Insect bite (nonvenomous) of left index finger, initial encounter: Secondary | ICD-10-CM | POA: Insufficient documentation

## 2014-07-15 DIAGNOSIS — M1990 Unspecified osteoarthritis, unspecified site: Secondary | ICD-10-CM | POA: Insufficient documentation

## 2014-07-15 DIAGNOSIS — Z8639 Personal history of other endocrine, nutritional and metabolic disease: Secondary | ICD-10-CM | POA: Insufficient documentation

## 2014-07-15 DIAGNOSIS — S60420A Blister (nonthermal) of right index finger, initial encounter: Secondary | ICD-10-CM | POA: Insufficient documentation

## 2014-07-15 DIAGNOSIS — E669 Obesity, unspecified: Secondary | ICD-10-CM | POA: Insufficient documentation

## 2014-07-15 DIAGNOSIS — S60949A Unspecified superficial injury of unspecified finger, initial encounter: Secondary | ICD-10-CM

## 2014-07-15 MED ORDER — CEPHALEXIN 500 MG PO CAPS: 500.0000 mg | ORAL_CAPSULE | Freq: Four times a day (QID) | ORAL | Status: DC

## 2014-07-15 MED ORDER — SILVER SULFADIAZINE 1 % EX CREA
TOPICAL_CREAM | Freq: Once | CUTANEOUS | Status: AC
  Administered 2014-07-15: 1 via TOPICAL
  Filled 2014-07-15: qty 50

## 2014-07-15 NOTE — ED Notes (Addendum)
Pt reports he was bit by spider on left index finger on Sunday. Area is blistering, red and swollen. Pt states he tried to I/D himself with no relief.

## 2014-07-15 NOTE — Discharge Instructions (Signed)
Fingertip Infection °When an infection is around the nail, it is called a paronychia. When it appears over the tip of the finger, it is called a felon. These infections are due to minor injuries or cracks in the skin. If they are not treated properly, they can lead to bone infection and permanent damage to the fingernail. °Incision and drainage is necessary if a pus pocket (an abscess) has formed. Antibiotics and pain medicine may also be needed. Keep your hand elevated for the next 2-3 days to reduce swelling and pain. If a pack was placed in the abscess, it should be removed in 1-2 days by your caregiver. Soak the finger in warm water for 20 minutes 4 times daily to help promote drainage. °Keep the hands as dry as possible. Wear protective gloves with cotton liners. See your caregiver for follow-up care as recommended.  °HOME CARE INSTRUCTIONS  °· Keep wound clean, dry and dressed as suggested by your caregiver. °· Soak in warm salt water for fifteen minutes, four times per day for bacterial infections. °· Your caregiver will prescribe an antibiotic if a bacterial infection is suspected. Take antibiotics as directed and finish the prescription, even if the problem appears to be improving before the medicine is gone. °· Only take over-the-counter or prescription medicines for pain, discomfort, or fever as directed by your caregiver. °SEEK IMMEDIATE MEDICAL CARE IF: °· There is redness, swelling, or increasing pain in the wound. °· Pus or any other unusual drainage is coming from the wound. °· An unexplained oral temperature above 102° F (38.9° C) develops. °· You notice a foul smell coming from the wound or dressing. °MAKE SURE YOU:  °· Understand these instructions. °· Monitor your condition. °· Contact your caregiver if you are getting worse or not improving. °Document Released: 01/28/2004 Document Revised: 03/14/2011 Document Reviewed: 01/24/2008 °ExitCare® Patient Information ©2015 [employer] LLC. This  information is not intended to replace advice given to you by your health care provider. Make sure you discuss any questions you have with your health care provider. ° °

## 2014-07-15 NOTE — ED Provider Notes (Signed)
History  This chart was scribed for non-physician practitioner, Danelle BerryLeisa Dashun Borre, PA-C,working with Gilda Creasehristopher J Pollina, MD by Karle PlumberJennifer Tensley, ED Scribe. This patient was seen in room WTR8/WTR8 and the patient's care was started at 2:51 PM.  Chief Complaint  Patient presents with  . Insect Bite   The history is provided by the patient and medical records. No language interpreter was used.    HPI Comments:  Zachary [name] is a 44 y.o. obese male who presents to the Emergency Department complaining of a worsening insect bite to the left index finger that occurred two days ago. Pt reports a throbbing pain he rates at 6-7/10. He was cleaning a box out with spider webs on it and believes a spider bit him although he denies seeing one. He states he tried lancing the area several times but he has not been able to produce any drainage. He has been taking Naproxen for pain with minimal relief of the pain. Touching the area makes the pain worse. He denies alleviating factors. He denies SOB, CP, inability to swallow, diaphoresis, fever, chills, nausea or vomiting. He reports allergy to Codeine and capsaicin creams.  Past Medical History  Diagnosis Date  . Obesity   . Bipolar 1 disorder, depressed   . PTSD (post-traumatic stress disorder)   . Arthritis   . Fibromyalgia    Past Surgical History  Procedure Laterality Date  . Gastric bypass    . Cholecystectomy    . Appendectomy    . Tympanostomy tube placement    . Adenoidectomy     Family History  Problem Relation Age of Onset  . Diabetes Father   . Cancer Father    History  Substance Use Topics  . Smoking status: Never Smoker   . Smokeless tobacco: Never Used  . Alcohol Use: No     Comment: none    Review of Systems  Allergies  Codeine  Home Medications   Prior to Admission medications   Medication Sig Start Date End Date Taking? Authorizing Provider  amitriptyline (ELAVIL) 100 MG tablet Take 1 tablet (100 mg total) by mouth  at bedtime. 07/08/13  Yes Beau FannyJohn C Withrow, FNP  mirtazapine (REMERON) 15 MG tablet Take 15 mg by mouth at bedtime.   Yes Historical Provider, MD  naproxen (NAPROSYN) 250 MG tablet Take 500-750 mg by mouth every 12 (twelve) hours as needed for mild pain or moderate pain.   Yes Historical Provider, MD  oxcarbazepine (TRILEPTAL) 600 MG tablet Take 600 mg by mouth 2 (two) times daily.   Yes Historical Provider, MD  citalopram (CELEXA) 20 MG tablet Take 1 tablet (20 mg total) by mouth daily. Patient not taking: Reported on 07/15/2014 07/08/13   Beau FannyJohn C Withrow, FNP  diclofenac (VOLTAREN) 75 MG EC tablet Take 1 tablet (75 mg total) by mouth 2 (two) times daily. Patient not taking: Reported on 07/15/2014 07/08/13   Beau FannyJohn C Withrow, FNP  ferrous sulfate 325 (65 FE) MG tablet Take 1 tablet (325 mg total) by mouth daily with breakfast. Patient not taking: Reported on 07/15/2014 07/08/13   Beau FannyJohn C Withrow, FNP  hydrochlorothiazide (HYDRODIURIL) 25 MG tablet Take 2 tablets (50 mg total) by mouth every morning. Patient not taking: Reported on 07/15/2014 07/08/13   Beau FannyJohn C Withrow, FNP  HYDROcodone-acetaminophen (NORCO/VICODIN) 5-325 MG per tablet Take 1 tablet by mouth every 6 (six) hours as needed. Patient not taking: Reported on 07/15/2014 06/11/14   Roxy Horsemanobert Browning, PA-C  hydrOXYzine (ATARAX/VISTARIL) 25 MG tablet Take  1 tablet (25 mg total) by mouth every 6 (six) hours as needed for anxiety. Patient not taking: Reported on 07/15/2014 07/08/13   Beau Fanny, FNP  lamoTRIgine (LAMICTAL) 25 MG tablet Take 1 tablet (25 mg total) by mouth 2 (two) times daily. Patient not taking: Reported on 07/15/2014 07/08/13   Beau Fanny, FNP  lisinopril (PRINIVIL,ZESTRIL) 10 MG tablet Take 1 tablet (10 mg total) by mouth every morning. Patient not taking: Reported on 07/15/2014 07/08/13   Beau Fanny, FNP  methocarbamol (ROBAXIN) 500 MG tablet Take 2 tablets (1,000 mg total) by mouth 4 (four) times daily as needed (Pain). Patient not taking:  Reported on 07/15/2014 04/24/14   Joni Reining Pisciotta, PA-C  topiramate (TOPAMAX) 25 MG tablet Take 3 tablets (75 mg total) by mouth 2 (two) times daily. Patient not taking: Reported on 07/15/2014 07/08/13   Beau Fanny, FNP   Triage Vitals: BP 148/91 mmHg  Pulse 83  Temp(Src) 97.6 F (36.4 C) (Oral)  Resp 18  SpO2 100% Physical Exam  Constitutional: He is oriented to person, place, and time. He appears well-developed and well-nourished. No distress.  HENT:  Head: Normocephalic and atraumatic.  Right Ear: External ear normal.  Left Ear: External ear normal.  Nose: Nose normal.  Mouth/Throat: Oropharynx is clear and moist. No oropharyngeal exudate.  Eyes: Conjunctivae and EOM are normal. Pupils are equal, round, and reactive to light. Right eye exhibits no discharge. Left eye exhibits no discharge. No scleral icterus.  Neck: Normal range of motion. Neck supple. No JVD present. No tracheal deviation present.  Cardiovascular: Normal rate, regular rhythm and normal heart sounds.   No murmur heard. Pulmonary/Chest: Effort normal and breath sounds normal. No stridor. No respiratory distress. He has no wheezes. He has no rales.  Musculoskeletal: Normal range of motion. He exhibits no edema.  Lymphadenopathy:    He has no cervical adenopathy.  Neurological: He is alert and oriented to person, place, and time. He exhibits normal muscle tone. Coordination normal.  Skin: Skin is warm and dry. No rash noted. He is not diaphoretic. No pallor.  Blister with surrounding erythema, nonblanching, approximately 1.5 cm in diameter located at the tip of the left index finger  Psychiatric: He has a normal mood and affect. His behavior is normal. Judgment and thought content normal.    ED Course  Procedures (including critical care time) DIAGNOSTIC STUDIES: Oxygen Saturation is 100% on RA, normal by my interpretation.   COORDINATION OF CARE: 2:58 PM- Declined narcotic pain relief due to absorption issues  and states they do not work well for him anyway. Pt verbalizes understanding and agrees to plan.  INCISION AND DRAINAGE PROCEDURE NOTE: Patient identification was confirmed and verbal consent was obtained. This procedure was performed by Danelle Berry, PA-C at 3:30 PM. Site: Palmer aspect of left index finger Sterile procedures observed Needle size: 25 G Anesthetic used (type and amt): Lidocaine 2% w/o Epinephrine (4 mLs) Blade size: 11 Drainage: scant purulent and serous drainage Complexity: Complex Packing used: none Digital block performed, adequate anesthesia obtained, and blister was unroofed with minimal purulent discharge, skin is red, nonblanching, no bleeding, discharge.  Silvadene was applied and covered with a sterile gauze and secured with paper tape Pt tolerated procedure well without complications.  Instructions for care discussed verbally and pt provided with additional written instructions for homecare and f/u.  Medications - No data to display  Labs Review Labs Reviewed - No data to display  Imaging Review No  results found.   EKG Interpretation None      MDM   Final diagnoses:  None   patient with reported insect bite to the fingertip with nonblanching erythematous blister.  Patient has lanced it several times and reports no drainage.  I performed a digital block after which time the patient told me to come back, allowing his finger to get more numb.  Before I could return to debride his blister, he had already ripped it open, and stated that there was discharge. Patient was instructed to wash his hands with soap and water, the rest of the procedure was completed at that time.  The nurse applied Silvadene sterile gauze and dressed with paper tape.  Patient was given explicit wound care instructions.  He was given Keflex.  Return precautions given patient verbalized understanding and was discharged home with stable vitals  I personally performed the services  described in this documentation, which was scribed in my presence. The recorded information has been reviewed and is accurate.    Danelle Berry, PA-C 07/23/14 0335  Gilda Crease, MD 07/24/14 [phone]

## 2014-08-26 ENCOUNTER — Encounter (HOSPITAL_COMMUNITY): Payer: Self-pay | Admitting: Emergency Medicine

## 2014-08-26 ENCOUNTER — Emergency Department (HOSPITAL_COMMUNITY)
Admission: EM | Admit: 2014-08-26 | Discharge: 2014-08-28 | Disposition: A | Discharge status: Home / Self Care | Payer: Self-pay | Attending: Emergency Medicine | Admitting: Emergency Medicine

## 2014-08-26 DIAGNOSIS — F4310 Post-traumatic stress disorder, unspecified: Secondary | ICD-10-CM | POA: Insufficient documentation

## 2014-08-26 DIAGNOSIS — M797 Fibromyalgia: Secondary | ICD-10-CM | POA: Insufficient documentation

## 2014-08-26 DIAGNOSIS — R45851 Suicidal ideations: Secondary | ICD-10-CM

## 2014-08-26 DIAGNOSIS — Z79899 Other long term (current) drug therapy: Secondary | ICD-10-CM | POA: Insufficient documentation

## 2014-08-26 DIAGNOSIS — F419 Anxiety disorder, unspecified: Secondary | ICD-10-CM | POA: Insufficient documentation

## 2014-08-26 DIAGNOSIS — M1990 Unspecified osteoarthritis, unspecified site: Secondary | ICD-10-CM | POA: Insufficient documentation

## 2014-08-26 DIAGNOSIS — E669 Obesity, unspecified: Secondary | ICD-10-CM | POA: Insufficient documentation

## 2014-08-26 DIAGNOSIS — Z8739 Personal history of other diseases of the musculoskeletal system and connective tissue: Secondary | ICD-10-CM | POA: Insufficient documentation

## 2014-08-26 DIAGNOSIS — F319 Bipolar disorder, unspecified: Secondary | ICD-10-CM | POA: Insufficient documentation

## 2014-08-26 DIAGNOSIS — F332 Major depressive disorder, recurrent severe without psychotic features: Secondary | ICD-10-CM | POA: Diagnosis present

## 2014-08-26 HISTORY — DX: Anxiety disorder, unspecified: F41.9

## 2014-08-26 HISTORY — DX: Personal history of other diseases of the musculoskeletal system and connective tissue: Z87.39

## 2014-08-26 LAB — COMPREHENSIVE METABOLIC PANEL
ALBUMIN: 3.5 g/dL (ref 3.5–5.0)
ALT: 18 U/L (ref 17–63)
AST: 20 U/L (ref 15–41)
Alkaline Phosphatase: 249 U/L — ABNORMAL HIGH (ref 38–126)
Anion gap: 5 (ref 5–15)
BUN: 13 mg/dL (ref 6–20)
CHLORIDE: 116 mmol/L — AB (ref 101–111)
CO2: 18 mmol/L — AB (ref 22–32)
CREATININE: 1.11 mg/dL (ref 0.61–1.24)
Calcium: 7.8 mg/dL — ABNORMAL LOW (ref 8.9–10.3)
GFR calc non Af Amer: >60 mL/min (ref >60)
GLUCOSE: 98 mg/dL (ref 65–99)
Potassium: 3.8 mmol/L (ref 3.5–5.1)
SODIUM: 139 mmol/L (ref 135–145)
Total Bilirubin: 0.2 mg/dL — ABNORMAL LOW (ref 0.3–1.2)
Total Protein: 6.8 g/dL (ref 6.5–8.1)

## 2014-08-26 LAB — CBC WITH DIFFERENTIAL/PLATELET
BASOS ABS: 0 10*3/uL (ref 0.0–0.1)
BASOS PCT: 0 % (ref 0–1)
EOS ABS: 0.1 10*3/uL (ref 0.0–0.7)
Eosinophils Relative: 3 % (ref 0–5)
HCT: 28.7 % — ABNORMAL LOW (ref 39.0–52.0)
Hemoglobin: 8.4 g/dL — ABNORMAL LOW (ref 13.0–17.0)
Lymphocytes Relative: 26 % (ref 12–46)
Lymphs Abs: 1.1 10*3/uL (ref 0.7–4.0)
MCH: 20.5 pg — AB (ref 26.0–34.0)
MCHC: 29.3 g/dL — ABNORMAL LOW (ref 30.0–36.0)
MCV: 70.2 fL — ABNORMAL LOW (ref 78.0–100.0)
Monocytes Absolute: 0.4 10*3/uL (ref 0.1–1.0)
Monocytes Relative: 8 % (ref 3–12)
NEUTROS ABS: 2.6 10*3/uL (ref 1.7–7.7)
NEUTROS PCT: 63 % (ref 43–77)
PLATELETS: 159 10*3/uL (ref 150–400)
RBC: 4.09 MIL/uL — AB (ref 4.22–5.81)
RDW: 18.4 % — ABNORMAL HIGH (ref 11.5–15.5)
WBC: 4.2 10*3/uL (ref 4.0–10.5)

## 2014-08-26 LAB — RAPID URINE DRUG SCREEN, HOSP PERFORMED
Amphetamines: NOT DETECTED
BARBITURATES: NOT DETECTED
Benzodiazepines: NOT DETECTED
COCAINE: NOT DETECTED
Opiates: NOT DETECTED
TETRAHYDROCANNABINOL: NOT DETECTED

## 2014-08-26 LAB — ACETAMINOPHEN LEVEL

## 2014-08-26 LAB — ETHANOL

## 2014-08-26 LAB — SALICYLATE LEVEL: Salicylate Lvl: <4 mg/dL (ref 2.8–30.0)

## 2014-08-26 MED ORDER — AMITRIPTYLINE HCL 25 MG PO TABS
100.0000 mg | ORAL_TABLET | Freq: Every day | ORAL | Status: DC
  Administered 2014-08-26 – 2014-08-27 (×2): 100 mg via ORAL
  Filled 2014-08-26 (×3): qty 4

## 2014-08-26 MED ORDER — OXCARBAZEPINE 300 MG PO TABS
600.0000 mg | ORAL_TABLET | Freq: Two times a day (BID) | ORAL | Status: DC
  Administered 2014-08-26 – 2014-08-27 (×4): 600 mg via ORAL
  Filled 2014-08-26 (×7): qty 2

## 2014-08-26 MED ORDER — MIRTAZAPINE 30 MG PO TABS
30.0000 mg | ORAL_TABLET | Freq: Every day | ORAL | Status: DC
  Administered 2014-08-26 – 2014-08-27 (×2): 30 mg via ORAL
  Filled 2014-08-26 (×3): qty 1

## 2014-08-26 NOTE — ED Notes (Signed)
Pt reports that his medications for bipolar/anxiety are no longer working. Also has had increased difficulty sleeping which has exacerbated his depression. Pt reports SI (if I could find a peaceful way to die I would do it). Recently has been under increased stress with disability coming up as well as recent move. Also has hx of chronic pain which has exacerbated symptoms.

## 2014-08-26 NOTE — ED Provider Notes (Signed)
CSN: [id number]     Arrival date & time 08/26/14  1053 History   First MD Initiated Contact with Patient 08/26/14 1100     Chief Complaint  Patient presents with  . Suicidal     (Consider location/radiation/quality/duration/timing/severity/associated sxs/prior Treatment) HPI 44 year old male who presents with suicide ideation. He has a history of bipolar disorder, PTSD, anxiety, fibromyalgia and chronic pain. Sees Dr. Dartha Lodge and counselor needed Yetta Barre regarding his psychiatric care. He reports several weeks now of progressively worsening anxiety attacks and depression, with unclear inciting factor. He reports that he deals with chronic pain issues, and this may play a role in his worsening depression. He has been exhibiting self cutting behavior over the past several weeks, and attempts to alleviate his stress and not an intention of hurting himself. However, he has been having increasing suicidal ideation, with no clear plan. He does state "if I can hurt myself in a way that doesn't cause pain, I would do it" denies any homicidal behavior. Denies alcohol abuse or illicit drug use. He is otherwise been in his usual state of health. Denies any fevers, chills, cough, difficulty breathing, chest pain, abdominal pain, nausea vomiting, diarrhea, or urinary complaints. Past Medical History  Diagnosis Date  . Obesity   . Bipolar 1 disorder, depressed   . PTSD (post-traumatic stress disorder)   . Arthritis   . Fibromyalgia   . Anxiety   . Hx of degenerative disc disease    Past Surgical History  Procedure Laterality Date  . Gastric bypass    . Cholecystectomy    . Appendectomy    . Tympanostomy tube placement    . Adenoidectomy     Family History  Problem Relation Age of Onset  . Diabetes Father   . Cancer Father    Social History  Substance Use Topics  . Smoking status: Never Smoker   . Smokeless tobacco: Never Used  . Alcohol Use: No     Comment: none    Review of  Systems 10/14 systems reviewed and are negative other than those stated in the HPI   Allergies  Capsaicin and Codeine  Home Medications   Prior to Admission medications   Medication Sig Start Date End Date Taking? Authorizing Provider  amitriptyline (ELAVIL) 100 MG tablet Take 1 tablet (100 mg total) by mouth at bedtime. 07/08/13  Yes Beau Fanny, FNP  mirtazapine (REMERON) 15 MG tablet Take 30 mg by mouth at bedtime.    Yes Historical Provider, MD  oxcarbazepine (TRILEPTAL) 600 MG tablet Take 600 mg by mouth 2 (two) times daily.   Yes Historical Provider, MD  cephALEXin (KEFLEX) 500 MG capsule Take 1 capsule (500 mg total) by mouth 4 (four) times daily. Patient not taking: Reported on 08/26/2014 07/15/14   Danelle Berry, PA-C  citalopram (CELEXA) 20 MG tablet Take 1 tablet (20 mg total) by mouth daily. Patient not taking: Reported on 07/15/2014 07/08/13   Beau Fanny, FNP  diclofenac (VOLTAREN) 75 MG EC tablet Take 1 tablet (75 mg total) by mouth 2 (two) times daily. Patient not taking: Reported on 07/15/2014 07/08/13   Beau Fanny, FNP  ferrous sulfate 325 (65 FE) MG tablet Take 1 tablet (325 mg total) by mouth daily with breakfast. Patient not taking: Reported on 07/15/2014 07/08/13   Beau Fanny, FNP  hydrochlorothiazide (HYDRODIURIL) 25 MG tablet Take 2 tablets (50 mg total) by mouth every morning. Patient not taking: Reported on 07/15/2014 07/08/13   Everardo All  Withrow, FNP  HYDROcodone-acetaminophen (NORCO/VICODIN) 5-325 MG per tablet Take 1 tablet by mouth every 6 (six) hours as needed. Patient not taking: Reported on 07/15/2014 06/11/14   Roxy Horseman, PA-C  hydrOXYzine (ATARAX/VISTARIL) 25 MG tablet Take 1 tablet (25 mg total) by mouth every 6 (six) hours as needed for anxiety. Patient not taking: Reported on 07/15/2014 07/08/13   Beau Fanny, FNP  lamoTRIgine (LAMICTAL) 25 MG tablet Take 1 tablet (25 mg total) by mouth 2 (two) times daily. Patient not taking: Reported on 07/15/2014  07/08/13   Beau Fanny, FNP  lisinopril (PRINIVIL,ZESTRIL) 10 MG tablet Take 1 tablet (10 mg total) by mouth every morning. Patient not taking: Reported on 07/15/2014 07/08/13   Beau Fanny, FNP  methocarbamol (ROBAXIN) 500 MG tablet Take 2 tablets (1,000 mg total) by mouth 4 (four) times daily as needed (Pain). Patient not taking: Reported on 07/15/2014 04/24/14   Joni Reining Pisciotta, PA-C  topiramate (TOPAMAX) 25 MG tablet Take 3 tablets (75 mg total) by mouth 2 (two) times daily. Patient not taking: Reported on 07/15/2014 07/08/13   Beau Fanny, FNP   BP 132/85 mmHg  Pulse 90  Temp(Src) 98.1 F (36.7 C) (Oral)  Resp 18  SpO2 100% Physical Exam Physical Exam  Nursing note and vitals reviewed. Constitutional: Well developed, well nourished, non-toxic, and in no acute distress Head: Normocephalic and atraumatic.  Mouth/Throat: Oropharynx is clear and moist.  Neck: Normal range of motion. Neck supple.  Cardiovascular: Normal rate and regular rhythm.   Pulmonary/Chest: Effort normal and breath sounds normal.  Abdominal: Soft. Obese. There is no tenderness. There is no rebound and no guarding.  Musculoskeletal: Normal range of motion.  Neurological: Alert, no facial droop, fluent speech, moves all extremities symmetrically Skin: Skin is warm and dry.  multiple superficial linear  lacerations over the bilateral wrists, well healing. Psychiatric: Cooperative  ED Course  Procedures (including critical care time) Labs Review Labs Reviewed  CBC WITH DIFFERENTIAL/PLATELET - Abnormal; Notable for the following:    RBC 4.09 (*)    Hemoglobin 8.4 (*)    HCT 28.7 (*)    MCV 70.2 (*)    MCH 20.5 (*)    MCHC 29.3 (*)    RDW 18.4 (*)    All other components within normal limits  COMPREHENSIVE METABOLIC PANEL - Abnormal; Notable for the following:    Chloride 116 (*)    CO2 18 (*)    Calcium 7.8 (*)    Alkaline Phosphatase 249 (*)    Total Bilirubin 0.2 (*)    All other components within  normal limits  ACETAMINOPHEN LEVEL - Abnormal; Notable for the following:    Acetaminophen (Tylenol), Serum <10 (*)    All other components within normal limits  URINE RAPID DRUG SCREEN, HOSP PERFORMED  SALICYLATE LEVEL  ETHANOL    Imaging Review No results found. I have personally reviewed and evaluated these images and lab results as part of my medical decision-making.   MDM   Final diagnoses:  Suicide ideation    In short, this is a 44 year old male with history of bipolar disorder and PTSD who presents with increasing suicidal ideation. He is nontoxic, in no acute distress. Vital signs within normal limits. Exam is otherwise on unremarkable and nonfocal. I he is not felt to to have any acute medical issues at this time. Basic blood work including psychiatric labs are unremarkable. TTS his consultative, with recommendations of inpatient psychiatric admission. Pending placement at this time.  Lavera Guise, MD 08/26/14 [phone]

## 2014-08-26 NOTE — ED Notes (Signed)
Bed: ZO10 Expected date:  Expected time:  Means of arrival:  Comments: Hold for Hallway C

## 2014-08-26 NOTE — BH Assessment (Signed)
Seeking inpt placement. Sent referrals to: [place], [facility]on Lan, Mikey Bussing, [facility], [place], [place], [facility]   Clista Bernhardt, [place] Triage Specialist 08/26/2014 10:14 PM

## 2014-08-26 NOTE — ED Notes (Signed)
TTS at bedside. 
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
## 2014-08-27 DIAGNOSIS — F332 Major depressive disorder, recurrent severe without psychotic features: Secondary | ICD-10-CM | POA: Diagnosis present

## 2014-08-27 MED ORDER — HYDROXYZINE HCL 25 MG PO TABS
50.0000 mg | ORAL_TABLET | Freq: Three times a day (TID) | ORAL | Status: DC | PRN
  Administered 2014-08-27: 50 mg via ORAL
  Filled 2014-08-27: qty 2
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
## 2014-08-27 NOTE — BHH Counselor (Signed)
Pending review for possible placement with [facility].  

## 2014-08-27 NOTE — Progress Notes (Signed)
Pt confirms he is seeing Dr Dartha Lodge at [facility] the [facility] updated

## 2014-08-27 NOTE — ED Notes (Signed)
Per CN okay for patient to have his own shirt due to paper scrubs being to small for patient.  Patient given shirt from his bag and inspected by this RN.  Patient changed into his shirt.

## 2014-08-28 ENCOUNTER — Encounter (HOSPITAL_COMMUNITY): Payer: Self-pay | Admitting: *Deleted

## 2014-08-28 ENCOUNTER — Inpatient Hospital Stay (HOSPITAL_COMMUNITY)
Admission: EM | Admit: 2014-08-28 | Discharge: 2014-09-02 | DRG: 885 | Disposition: A | Discharge status: Home / Self Care | Payer: Federal, State, Local not specified - Other | Source: Intra-hospital | Attending: Psychiatry | Admitting: Psychiatry

## 2014-08-28 DIAGNOSIS — Z6841 Body Mass Index (BMI) 40.0 and over, adult: Secondary | ICD-10-CM | POA: Diagnosis not present

## 2014-08-28 DIAGNOSIS — I10 Essential (primary) hypertension: Secondary | ICD-10-CM | POA: Diagnosis present

## 2014-08-28 DIAGNOSIS — D638 Anemia in other chronic diseases classified elsewhere: Secondary | ICD-10-CM | POA: Diagnosis present

## 2014-08-28 DIAGNOSIS — F314 Bipolar disorder, current episode depressed, severe, without psychotic features: Principal | ICD-10-CM | POA: Diagnosis present

## 2014-08-28 DIAGNOSIS — G4700 Insomnia, unspecified: Secondary | ICD-10-CM | POA: Diagnosis present

## 2014-08-28 DIAGNOSIS — N179 Acute kidney failure, unspecified: Secondary | ICD-10-CM | POA: Diagnosis present

## 2014-08-28 DIAGNOSIS — M797 Fibromyalgia: Secondary | ICD-10-CM | POA: Diagnosis present

## 2014-08-28 DIAGNOSIS — F0631 Mood disorder due to known physiological condition with depressive features: Secondary | ICD-10-CM | POA: Diagnosis present

## 2014-08-28 DIAGNOSIS — F4310 Post-traumatic stress disorder, unspecified: Secondary | ICD-10-CM | POA: Diagnosis present

## 2014-08-28 DIAGNOSIS — Z809 Family history of malignant neoplasm, unspecified: Secondary | ICD-10-CM | POA: Diagnosis not present

## 2014-08-28 DIAGNOSIS — F332 Major depressive disorder, recurrent severe without psychotic features: Secondary | ICD-10-CM | POA: Diagnosis present

## 2014-08-28 DIAGNOSIS — Z833 Family history of diabetes mellitus: Secondary | ICD-10-CM

## 2014-08-28 DIAGNOSIS — F410 Panic disorder [episodic paroxysmal anxiety] without agoraphobia: Secondary | ICD-10-CM | POA: Diagnosis present

## 2014-08-28 DIAGNOSIS — D696 Thrombocytopenia, unspecified: Secondary | ICD-10-CM | POA: Diagnosis present

## 2014-08-28 DIAGNOSIS — G8929 Other chronic pain: Secondary | ICD-10-CM | POA: Diagnosis present

## 2014-08-28 DIAGNOSIS — R45851 Suicidal ideations: Secondary | ICD-10-CM | POA: Diagnosis not present

## 2014-08-28 DIAGNOSIS — Z9884 Bariatric surgery status: Secondary | ICD-10-CM

## 2014-08-28 LAB — CBC WITH DIFFERENTIAL/PLATELET
Basophils Absolute: 0 10*3/uL (ref 0.0–0.1)
Basophils Relative: 0 % (ref 0–1)
Eosinophils Absolute: 0.1 10*3/uL (ref 0.0–0.7)
Eosinophils Relative: 2 % (ref 0–5)
HEMATOCRIT: 30.1 % — AB (ref 39.0–52.0)
HEMOGLOBIN: 8.9 g/dL — AB (ref 13.0–17.0)
LYMPHS PCT: 24 % (ref 12–46)
Lymphs Abs: 1 10*3/uL (ref 0.7–4.0)
MCH: 20.8 pg — AB (ref 26.0–34.0)
MCHC: 29.6 g/dL — AB (ref 30.0–36.0)
MCV: 70.5 fL — ABNORMAL LOW (ref 78.0–100.0)
MONO ABS: 0.4 10*3/uL (ref 0.1–1.0)
MONOS PCT: 8 % (ref 3–12)
NEUTROS ABS: 2.7 10*3/uL (ref 1.7–7.7)
NEUTROS PCT: 66 % (ref 43–77)
PLATELETS: 107 10*3/uL — AB (ref 150–400)
RBC: 4.27 MIL/uL (ref 4.22–5.81)
RDW: 18.5 % — AB (ref 11.5–15.5)
WBC: 4.2 10*3/uL (ref 4.0–10.5)

## 2014-08-28 MED ORDER — HYDROXYZINE HCL 25 MG PO TABS
25.0000 mg | ORAL_TABLET | Freq: Three times a day (TID) | ORAL | Status: DC | PRN
  Administered 2014-08-29 – 2014-08-31 (×3): 25 mg via ORAL
  Filled 2014-08-28 (×3): qty 1

## 2014-08-28 MED ORDER — MIRTAZAPINE 30 MG PO TABS
30.0000 mg | ORAL_TABLET | Freq: Every day | ORAL | Status: DC
  Administered 2014-08-28: 30 mg via ORAL
  Filled 2014-08-28 (×3): qty 1

## 2014-08-28 MED ORDER — TRAZODONE HCL 50 MG PO TABS: 50.0000 mg | ORAL_TABLET | Freq: Every evening | ORAL | Status: DC | PRN

## 2014-08-28 MED ORDER — DULOXETINE HCL 30 MG PO CPEP
30.0000 mg | ORAL_CAPSULE | Freq: Every day | ORAL | Status: DC
  Administered 2014-08-28 – 2014-09-02 (×6): 30 mg via ORAL
  Filled 2014-08-28 (×2): qty 1
  Filled 2014-08-28: qty 7
  Filled 2014-08-28 (×5): qty 1

## 2014-08-28 MED ORDER — ACETAMINOPHEN 325 MG PO TABS
650.0000 mg | ORAL_TABLET | Freq: Four times a day (QID) | ORAL | Status: DC | PRN
  Administered 2014-09-01: 650 mg via ORAL
  Filled 2014-08-28: qty 2

## 2014-08-28 MED ORDER — TRAZODONE HCL 50 MG PO TABS
50.0000 mg | ORAL_TABLET | Freq: Every evening | ORAL | Status: DC | PRN
  Administered 2014-08-28: 50 mg via ORAL
  Filled 2014-08-28: qty 1

## 2014-08-28 MED ORDER — LIDOCAINE 5 % EX PTCH
2.0000 | MEDICATED_PATCH | CUTANEOUS | Status: DC
  Administered 2014-08-28 – 2014-08-31 (×3): 2 via TRANSDERMAL
  Filled 2014-08-28 (×7): qty 2

## 2014-08-28 MED ORDER — FERROUS SULFATE 325 (65 FE) MG PO TABS
325.0000 mg | ORAL_TABLET | Freq: Every day | ORAL | Status: DC
  Administered 2014-08-29 – 2014-09-02 (×5): 325 mg via ORAL
  Filled 2014-08-28 (×7): qty 1

## 2014-08-28 MED ORDER — MAGNESIUM HYDROXIDE 400 MG/5ML PO SUSP: 30.0000 mL | Freq: Every day | ORAL | Status: DC | PRN

## 2014-08-28 MED ORDER — OXCARBAZEPINE 300 MG PO TABS
600.0000 mg | ORAL_TABLET | Freq: Two times a day (BID) | ORAL | Status: DC
  Administered 2014-08-28 – 2014-09-02 (×10): 600 mg via ORAL
  Filled 2014-08-28: qty 28
  Filled 2014-08-28 (×8): qty 2
  Filled 2014-08-28: qty 28
  Filled 2014-08-28 (×5): qty 2

## 2014-08-28 MED ORDER — ALUM & MAG HYDROXIDE-SIMETH 200-200-20 MG/5ML PO SUSP: 30.0000 mL | ORAL | Status: DC | PRN

## 2014-08-28 NOTE — [facility] Group Notes (Signed)
[facility] Mental [facility] Association Group Therapy 08/28/2014 1:15pm  Type of Therapy: Mental Health Association Presentation  Participation Level: Active  Participation Quality: Attentive  Affect: Appropriate  Cognitive: Oriented  Insight: Developing/Improving  Engagement in Therapy: Engaged  Modes of Intervention: Discussion, Education and Socialization  Summary of Progress/Problems: Mental Health Association (MHA) Speaker came to talk about his personal journey with substance abuse and addiction. The pt processed ways by which to relate to the speaker. MHA speaker provided handouts and educational information pertaining to groups and services offered by the [facility]. Pt was engaged in speaker's presentation and was receptive to resources provided.    Chad Cordial, LCSWA 08/28/2014 1:44 PM

## 2014-08-28 NOTE — Progress Notes (Signed)
Nutrition Brief Note  Patient identified on the Malnutrition Screening Tool (MST) Report RD unable to speak with patient d/t meeting with MD at time of visit.  Pt with 14 lb weight loss since 6/08 (4% wt loss x 3 months).  Pt with Hx of gastric bypass.  Wt Readings from Last 15 Encounters:  08/28/14 326 lb (147.873 kg)  06/11/14 340 lb (154.223 kg)  01/01/13 378 lb (171.46 kg)    Body mass index is 51.05 kg/(m^2). Patient meets criteria for morbid obesity based on current BMI.   Diet Order: Diet regular Room service appropriate?: No; Fluid consistency:: Thin Pt is also offered choice of unit snacks mid-morning and mid-afternoon.  Pt is eating as desired.    Labs and medications reviewed.   No nutrition interventions warranted at this time. If nutrition issues arise, please consult RD.   Tilda Franco, MS, RD, LDN Pager: [phone] After Hours Pager: [phone]

## 2014-08-28 NOTE — Consult Note (Signed)
[facility] Medical Consultation  Zachary [name] ZOX:[id number] DOB: Dec 03, 1970 DOA: 08/28/2014 PCP: Dartha Lodge, FNP   Requesting physician: Dr. Jama Flavors Date of consultation: 08/28/2014 Reason for consultation: abnormal blood work   Impression/Recommendations Active Problems:   MDD (major depressive disorder), recurrent episode, severe - management per primary team    Elevated PTH - in 2015 PTH was > 400 - agree with repeating PTH to see what the new baseline is and will provide further recommendations based on the results    Anemia of chronic disease - IDS, pt takes supplements at home - this can be monitored in an outpatient setting    Chronic thrombocytopenia - review of records indicate chronic low Plt with baseline at ~ 120 - no signs of active bleeding    Low bicarb - encourage oral intake - will repeat BMP in AM   Morbid obesity  - Body mass index is 51.05 kg/(m^2).  I will followup again tomorrow. Please contact me if I can be of assistance in the meanwhile. Thank you for this consultation.  Chief Complaint: consult for abnormal blood work, PTH, anemia   HPI: Pt is 44 yo morbidly obese male, who was admitted to [facility] earlier in the day for evaluation of SI. Pt has known history of bipolar disorder and depression. [facility] was consulted as PTH in the past year has been > 400 and pt has not had any follow up. Pt currently denies any specific concerns such as chest pain or shortness of breath, no abd or urinary concern, he does report weight loss but says that mostly intentional.   Review of Systems:  Constitutional: Negative for fever, chills, diaphoresis, activity change, appetite change and fatigue.  HENT: Negative for ear pain, nosebleeds, congestion, facial swelling, rhinorrhea, neck pain, neck stiffness and ear discharge.   Eyes: Negative for pain, discharge, redness, itching and visual disturbance.  Respiratory: Negative for cough, choking, chest tightness,  shortness of breath, wheezing and stridor.   Cardiovascular: Negative for chest pain, palpitations and leg swelling.  Gastrointestinal: Negative for abdominal distention.  Genitourinary: Negative for dysuria, urgency, frequency, decreased urine volume, difficulty urinating and dyspareunia.  Musculoskeletal: Negative for back pain, joint swelling, arthralgias and gait problem.  Neurological: Negative for dizziness, tremors, seizures, syncope, facial asymmetry, speech difficulty, weakness, light-headedness, numbness and headaches.  Hematological: Negative for adenopathy. Does not bruise/bleed easily.  Psychiatric/Behavioral: Positive for SI    Past Medical History  Diagnosis Date  . Obesity   . Bipolar 1 disorder, depressed   . PTSD (post-traumatic stress disorder)   . Arthritis   . Fibromyalgia   . Anxiety   . Hx of degenerative disc disease    Past Surgical History  Procedure Laterality Date  . Gastric bypass    . Cholecystectomy    . Appendectomy    . Tympanostomy tube placement    . Adenoidectomy     Social History:  reports that he has never smoked. He has never used smokeless tobacco. He reports that he does not drink alcohol or use illicit drugs.  Allergies  Allergen Reactions  . Capsaicin Other (See Comments)    unknown  . Codeine Itching   Family History  Problem Relation Age of Onset  . Diabetes Father   . Cancer Father     Prior to Admission medications   Medication Sig Start Date End Date Taking? Authorizing Provider  ferrous sulfate 325 (65 FE) MG tablet Take 1 tablet (325 mg total) by mouth daily with breakfast. Patient  not taking: Reported on 07/15/2014 07/08/13   Beau Fanny, FNP  mirtazapine (REMERON) 15 MG tablet Take 30 mg by mouth at bedtime.     Historical Provider, MD  oxcarbazepine (TRILEPTAL) 600 MG tablet Take 600 mg by mouth 2 (two) times daily.    Historical Provider, MD   Physical Exam: Blood pressure 146/86, pulse 83, temperature 98.7 F  (37.1 C), temperature source Oral, resp. rate 18, height 5\' 7"  (1.702 m), weight 147.873 kg (326 lb). Filed Vitals:   08/28/14 0533  BP: 146/86  Pulse: 83  Temp: 98.7 F (37.1 C)  Resp: 18   Physical Exam  Constitutional: Appears well-developed and well-nourished. No distress.  HENT: Normocephalic. External right and left ear normal. Oropharynx is clear and moist.  Eyes: Conjunctivae and EOM are normal. PERRLA, no scleral icterus.  Neck: Normal ROM. Neck supple. No JVD. No tracheal deviation. No thyromegaly.  CVS: RRR, S1/S2 +, no murmurs, no gallops, no carotid bruit.  Pulmonary: Effort and breath sounds normal, no stridor, rhonchi, wheezes, rales.  Abdominal: Soft. BS +,  no distension, tenderness, rebound or guarding.  Musculoskeletal: Normal range of motion. No edema and no tenderness.  Lymphadenopathy: No lymphadenopathy noted, cervical, inguinal. Neuro: Alert. Normal reflexes, muscle tone coordination. No cranial nerve deficit. Skin: Skin is warm and dry. No rash noted. Not diaphoretic. No erythema. No pallor.  Psychiatric: Normal mood and affect.    Labs on Admission:  Basic Metabolic Panel:  Recent Labs Lab 08/26/14 1216  NA 139  K 3.8  CL 116*  CO2 18*  GLUCOSE 98  BUN 13  CREATININE 1.11  CALCIUM 7.8*   Liver Function Tests:  Recent Labs Lab 08/26/14 1216  AST 20  ALT 18  ALKPHOS 249*  BILITOT 0.2*  PROT 6.8  ALBUMIN 3.5   CBC:  Recent Labs Lab 08/26/14 1216 08/28/14 0618  WBC 4.2 4.2  NEUTROABS 2.6 2.7  HGB 8.4* 8.9*  HCT 28.7* 30.1*  MCV 70.2* 70.5*  PLT 159 107*    Radiological Exams on Admission: No results found.  EKG: not done   Time spent: 45 minutes   MAGICK-Bettie Swavely [facility] Pager [phone]  If 7PM-7AM, please contact night-coverage [email or web address] Password [email or web address] 08/28/2014, 1:32 PM

## 2014-08-28 NOTE — Tx Team (Signed)
Interdisciplinary Treatment Plan Update (Adult) Date: 08/28/2014   Date: 08/28/2014 1:44 PM  Progress in Treatment:  Attending groups: Yes  Participating in groups: Yes  Taking medication as prescribed: Yes  Tolerating medication: Yes  Family/Significant othe contact made: No, CSW assessing for appropriate contacts Patient understands diagnosis: Yes Discussing patient identified problems/goals with staff: Yes  Medical problems stabilized or resolved: Yes  Denies suicidal/homicidal ideation: No, endorses passive SI Patient has not harmed self or Others: Yes   New problem(s) identified: None identified at this time.   Discharge Plan or Barriers: CSW will assess for appropriate discharge plan and relevant barriers.   Additional comments: n/a   Reason for Continuation of Hospitalization:  Depression Medication stabilization Suicidal ideation  Estimated length of stay: 3-5 days  Review of initial/current patient goals per problem list:   1.  Goal(s): Patient will participate in aftercare plan  Met:  No  Target date: 3-5 days from date of admission   As evidenced by: Patient will participate within aftercare plan AEB aftercare provider and housing plan at discharge being identified.  08/28/14: CSW to work with Pt to assess for appropriate discharge plan and faciliate appointments and referrals as needed prior to d/c.  2.  Goal (s): Patient will exhibit decreased depressive symptoms and suicidal ideations.  Met:  No  Target date: 3-5 days from date of admission   As evidenced by: Patient will utilize self rating of depression at 3 or below and demonstrate decreased signs of depression or be deemed stable for discharge by MD. 08/28/14: Pt was admitted with symptoms of depression, rating 10/10. Pt continues to present with flat affect and depressive symptoms.  Pt will demonstrate decreased symptoms of depression and rate depression at 3/10 or lower prior to discharge. Pt endorsing  passive SI.  Attendees:  Patient:    Family:    Physician: Dr. Parke Poisson, MD  08/28/2014 1:44 PM  Nursing: Lars Pinks, RN Case manager  08/28/2014 1:44 PM  Clinical Social Worker Norman Clay, MSW 08/28/2014 1:44 PM  Other: Lucinda Dell, Beverly Sessions Liasion 08/28/2014 1:44 PM  Clinical:  Darrol Angel, RN 08/28/2014 1:44 PM  Other: , RN Charge Nurse 08/28/2014 1:44 PM  Other:     Peri Maris, [place] MSW

## 2014-08-28 NOTE — Progress Notes (Signed)
Janann August, NP, [facility] requests ED MD evaluate hemoglobin level prior to transfer to [facility]. Rosey Bath, RN

## 2014-08-28 NOTE — Progress Notes (Signed)
Adult Psychoeducational Group Note  Date:  08/28/2014 Time:  0900  Group Topic/Focus:  Orientation:   The focus of this group is to educate the patient on the purpose and policies of crisis stabilization and provide a format to answer questions about their admission.  The group details unit policies and expectations of patients while admitted.  Participation Level:  Did Not Attend  Participation Quality:    Affect:    Cognitive:    Insight:   Engagement in Group:    Modes of Intervention:    Additional Comments:    Loris Winrow L 08/28/2014, 6:12 PM

## 2014-08-28 NOTE — Progress Notes (Signed)
[facility] Group Notes:  (Nursing/MHT/Case Management/Adjunct)  Date:  08/28/2014  Time:  9:24 PM  Type of Therapy:  Psychoeducational Skills  Participation Level:  Active  Participation Quality:  Appropriate and Attentive  Affect:  Appropriate  Cognitive:  Appropriate  Insight:  Appropriate  Engagement in Group:  Engaged  Modes of Intervention:  Activity  Summary of Progress/Problems: Pts played a game of Wellness Jeopardy for Wrap Up group. Pt was engaged and participated appropriately. Pt stated one positive thing about his day is that he had a surprise visit from his girlfriend and his roommate.  Zachary [name] 08/28/2014, 9:24 PM

## 2014-08-28 NOTE — Progress Notes (Signed)
44 year old male admitted on voluntary basis. Pt reports depression with suicidal thoughts and believes that his medications are no longer effective as he is experiencing various depressive symptoms. Pt does have self inflicted lacerations to wrists which appear superficial and there is no drainage noted on admission. Pt reports that he lives with his girlfriend and a roommate and reports they are very supportive and reports that they take good care of him. Pt does ambulate with the assistance of 2 canes and reports that he falls very easily and does indeed appear unsteady while ambulating. Pt was oriented to the unit and safety maintained.

## 2014-08-28 NOTE — BH Assessment (Signed)
[facility] Assessment Progress Note   Clinician was informed by Rosey Bath, [facility] that patient had been accepted to [facility] 404-1 to Dr. Jama Flavors.  Clinician notified PA Sharen Hones about patient disposition.

## 2014-08-28 NOTE — Progress Notes (Signed)
D: Pt pt presents with flat affect and depressed mood. Pt reported feeling depressed d/t pain and meds not being effective. Pt endorses passive suicidal thoughts. Pt verbally contracts not to harm self. Pt reported that he do not want the focus of his tx to be about his pain because he is often label as that patient who presents to the hosp just to get pain meds. Pt ambulates using two canes d/t unsteady gait. Pt compliant with taking meds and attending groups. A: Medications administered as ordered per MD. Verbal support given. Pt encouraged to attend groups. 15 minute checks performed for safety. R: Pt receptive to tx.

## 2014-08-28 NOTE — Tx Team (Signed)
Initial Interdisciplinary Treatment Plan   PATIENT STRESSORS: feels medications are not working   PATIENT STRENGTHS: Ability for insight Active sense of humor Average or above average intelligence Capable of independent living [employer] of knowledge Motivation for treatment/growth Supportive family/friends   PROBLEM LIST: Problem List/Patient Goals Date to be addressed Date deferred Reason deferred Estimated date of resolution  Depression 08/28/14     Suicidal Ideation 08/28/14     "My medications are not working" 08/28/14                                          DISCHARGE CRITERIA:  Ability to meet basic life and health needs Improved stabilization in mood, thinking, and/or behavior Verbal commitment to aftercare and medication compliance  PRELIMINARY DISCHARGE PLAN: Attend aftercare/continuing care group Return to previous living arrangement  PATIENT/FAMIILY INVOLVEMENT: This treatment plan has been presented to and reviewed with the patient, Zachary [name], and/or family member, .  The patient and family have been given the opportunity to ask questions and make suggestions.  Zachary [name], [place] 08/28/2014, 5:50 AM

## 2014-08-28 NOTE — Progress Notes (Signed)
Patient accepted to [facility]. Room 404/1. Rosey Bath, RN.

## 2014-08-28 NOTE — ED Provider Notes (Signed)
Patient presented to the emergency department with suicidal ideation. He has a history of bipolar disorder, PTSD and anxiety. Patient has been evaluated and has been accepted at behavioral health by Dr. Jama Flavors. Patient states he is willing to be admitted. At this point he has voluntary.  Devoria Albe, MD, Concha Pyo, MD 08/28/14 [phone]

## 2014-08-28 NOTE — BHH Counselor (Signed)
Counselor contacted Dr. Juleen China at [phone] to inform him that patient is pending acceptance at [facility] due to his low hemoglobin level. BHH has requested that Dr. Juleen China review and evaluate hemoglobin level to clear patient to be accepted to [facility]. Dr. Juleen China states that he will review.   Davina Poke, LCSW Therapeutic Triage Specialist [facility] 08/28/2014 12:39 AM
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
## 2014-08-28 NOTE — ED Provider Notes (Signed)
Pt accepted at [facility]. Noted to be anemic. Microcytic. Down from prior but last comparison over a year ago. Suspect this is chronic to some degree. No tachycardia. Mild hypertension. Not c/o symptoms typical of anemia to previous providers. I feel this can be further evaluated in outpatient setting and he is currently medically cleared for pyschiatric care.   Raeford Razor, MD 08/28/14 [phone]

## 2014-08-29 DIAGNOSIS — N179 Acute kidney failure, unspecified: Secondary | ICD-10-CM | POA: Insufficient documentation

## 2014-08-29 DIAGNOSIS — I10 Essential (primary) hypertension: Secondary | ICD-10-CM | POA: Insufficient documentation

## 2014-08-29 DIAGNOSIS — R45851 Suicidal ideations: Secondary | ICD-10-CM

## 2014-08-29 DIAGNOSIS — F0631 Mood disorder due to known physiological condition with depressive features: Secondary | ICD-10-CM | POA: Diagnosis present

## 2014-08-29 DIAGNOSIS — F314 Bipolar disorder, current episode depressed, severe, without psychotic features: Secondary | ICD-10-CM | POA: Diagnosis present

## 2014-08-29 LAB — BASIC METABOLIC PANEL
ANION GAP: 6 (ref 5–15)
BUN: 20 mg/dL (ref 6–20)
CALCIUM: 8.3 mg/dL — AB (ref 8.9–10.3)
CO2: 21 mmol/L — AB (ref 22–32)
Chloride: 113 mmol/L — ABNORMAL HIGH (ref 101–111)
Creatinine, Ser: 1.25 mg/dL — ABNORMAL HIGH (ref 0.61–1.24)
Glucose, Bld: 106 mg/dL — ABNORMAL HIGH (ref 65–99)
Potassium: 3.9 mmol/L (ref 3.5–5.1)
Sodium: 140 mmol/L (ref 135–145)

## 2014-08-29 LAB — CBC
HEMATOCRIT: 30.8 % — AB (ref 39.0–52.0)
Hemoglobin: 9 g/dL — ABNORMAL LOW (ref 13.0–17.0)
MCH: 20.6 pg — ABNORMAL LOW (ref 26.0–34.0)
MCHC: 29.2 g/dL — ABNORMAL LOW (ref 30.0–36.0)
MCV: 70.5 fL — ABNORMAL LOW (ref 78.0–100.0)
Platelets: 133 10*3/uL — ABNORMAL LOW (ref 150–400)
RBC: 4.37 MIL/uL (ref 4.22–5.81)
RDW: 18.4 % — AB (ref 11.5–15.5)
WBC: 4.5 10*3/uL (ref 4.0–10.5)

## 2014-08-29 MED ORDER — TRAZODONE HCL 100 MG PO TABS
100.0000 mg | ORAL_TABLET | Freq: Every evening | ORAL | Status: DC | PRN
  Administered 2014-08-29 – 2014-08-31 (×3): 100 mg via ORAL
  Filled 2014-08-29 (×3): qty 1

## 2014-08-29 MED ORDER — MIRTAZAPINE 15 MG PO TABS
15.0000 mg | ORAL_TABLET | Freq: Every day | ORAL | Status: DC
  Administered 2014-08-29 – 2014-09-01 (×4): 15 mg via ORAL
  Filled 2014-08-29: qty 1
  Filled 2014-08-29: qty 7
  Filled 2014-08-29 (×4): qty 1

## 2014-08-29 NOTE — B[facility] Group Notes (Signed)
[facility] LCSW Aftercare Discharge Planning Group Note  08/29/2014 8:45 AM  Participation Quality: Alert, Appropriate and Oriented  Mood/Affect: Flat  Depression Rating: 6   Anxiety Rating: 6  Thoughts of Suicide: Pt denies SI/HI  Will you contract for safety? Yes  Current AVH: Pt denies  Plan for Discharge/Comments: Pt attended discharge planning group and actively participated in group. CSW discussed suicide prevention education with the group and encouraged them to discuss discharge planning and any relevant barriers. Pt describes, "I'm here" when asked how he was doing this morning. Pt was minimal during interaction. No needs expressed at this time.  Transportation Means: Pt reports access to transportation  Supports: Girlfriend  Zachary [name], [facility] 08/29/2014 9:47 AM

## 2014-08-29 NOTE — B[facility] Group Notes (Signed)
[facility] LCSW Group Therapy 08/29/2014 1:15pm  Type of Therapy: Group Therapy- Feelings Around Relapse and Recovery  Participation Level: Minimal  Participation Quality:  Attentive  Affect:  Appropriate  Cognitive: Alert and Oriented   Insight:  Developing   Engagement in Therapy: Developing/Improving and Engaged   Modes of Intervention: Clarification, Confrontation, Discussion, Education, Exploration, Limit-setting, Orientation, Problem-solving, Rapport Building, Dance movement psychotherapist, Socialization and Support  Summary of Progress/Problems: The topic for today was feelings about relapse. The group discussed what relapse prevention is to them and identified triggers that they are on the path to relapse. Members also processed their feeling towards relapse and were able to relate to common experiences. Group also discussed coping skills that can be used for relapse prevention.  Pt did not participate verbally but was attentive AEB nonverbal affirming gestures.   Therapeutic Modalities:   Cognitive Behavioral Therapy Solution-Focused Therapy Assertiveness Training Relapse Prevention Therapy    Zachary [name] [phone] 08/29/2014 4:26 PM

## 2014-08-29 NOTE — Progress Notes (Signed)
D Pt. Reports passive SI,  Pt. Does complain of chronic knee pain.   A Writer offered support and encouragement,  Adm.  lidocaine patches for pain.  R Pt. Remains safe on the unit,   Reports he needs knee surgery but MD's state he is too young for surgery at present time.  Pt. Does contract for safety.

## 2014-08-29 NOTE — Progress Notes (Signed)
During wrap-up group pt stated that he had an okay day, states that he was happily surprised by GF and roommate coming to visit this evening.  States that his goal was trying not be negative, when asked how he did stated that ot was hard but he was trying.   Tomi Bamberger, MHT

## 2014-08-29 NOTE — Progress Notes (Signed)
[facility] Progress Note   Zachary [name] ZOX:[id number] DOB: [birth year] DOA: 08/28/2014 PCP: Dartha Lodge, FNP  Subjective: No complaints.   Assessment/Plan: Principal Problem:   Bipolar disorder, current episode depressed, severe, without psychotic features - per psych  Active Problems:  Elevated PTH/ low calcium - when last check in 07/2013 - repeating- would need endocrine level if abnormal  Low Bicarb - improving -   ARF --Cr risen from yesterday- no clear reason why- may be lab variance-   encouraged to drink more fluids - recheck tomorrow  HTN - HCTZ and Lisinopril on hold- follow  AOCD  - cont outpt follow up  Chronic thrombocytopenia - stable    Morbid obesity   Appt with PCP: Code Status:     Code Status Orders        Start     Ordered   08/28/14 0534  Full code   Continuous     08/28/14 0533      Antibiotics: Anti-infectives    None      Objective: Filed Weights   08/28/14 0533  Weight: 147.873 kg (326 lb)   No intake or output data in the 24 hours ending 08/29/14 1346   Vitals Filed Vitals:   08/28/14 0533 08/29/14 0700 08/29/14 0701  BP: 146/86 143/78 138/84  Pulse: 83 76 96  Temp: 98.7 F (37.1 C) 98 F (36.7 C)   TempSrc: Oral    Resp: 18 20   Height: 5\' 7"  (1.702 m)    Weight: 147.873 kg (326 lb)      Exam:  General:  Pt is alert, not in acute distress  HEENT: No icterus, No thrush, oral mucosa moist  Cardiovascular: regular rate and rhythm, S1/S2 No murmur  Respiratory: clear to auscultation bilaterally   Abdomen: Soft, +Bowel sounds, non tender, non distended, no guarding  MSK: No LE edema, cyanosis or clubbing  Data Reviewed: Basic Metabolic Panel:  Recent Labs Lab 08/26/14 1216 08/29/14 0650  NA 139 140  K 3.8 3.9  CL 116* 113*  CO2 18* 21*  GLUCOSE 98 106*  BUN 13 20  CREATININE 1.11 1.25*  CALCIUM 7.8* 8.3*   Liver Function Tests:  Recent Labs Lab 08/26/14 1216  AST 20   ALT 18  ALKPHOS 249*  BILITOT 0.2*  PROT 6.8  ALBUMIN 3.5   No results for input(s): LIPASE, AMYLASE in the last 168 hours. No results for input(s): AMMONIA in the last 168 hours. CBC:  Recent Labs Lab 08/26/14 1216 08/28/14 0618 08/29/14 0650  WBC 4.2 4.2 4.5  NEUTROABS 2.6 2.7  --   HGB 8.4* 8.9* 9.0*  HCT 28.7* 30.1* 30.8*  MCV 70.2* 70.5* 70.5*  PLT 159 107* 133*   Cardiac Enzymes: No results for input(s): CKTOTAL, CKMB, CKMBINDEX, TROPONINI in the last 168 hours. BNP (last 3 results) No results for input(s): BNP in the last 8760 hours.  ProBNP (last 3 results) No results for input(s): PROBNP in the last 8760 hours.  CBG: No results for input(s): GLUCAP in the last 168 hours.  No results found for this or any previous visit (from the past 240 hour(s)).   Studies: No results found.  Scheduled Meds:  Scheduled Meds: . DULoxetine  30 mg Oral Daily  . ferrous sulfate  325 mg Oral Q breakfast  . lidocaine  2 patch Transdermal Q24H  . mirtazapine  15 mg Oral QHS  . oxcarbazepine  600 mg Oral BID   Continuous Infusions:   Time spent  on care of this patient: 35  min   Henning Ehle, MD 08/29/2014, 1:46 PM  LOS: 1 day   [facility] Office  [phone] Pager - Text Page per [email or web address] If 7PM-7AM, please contact night-coverage [email or web address]

## 2014-08-29 NOTE — Progress Notes (Signed)
   Zachary [name] remains quiet, passively pleasant and still acutely depressed. HE says his biggest stressors cont to be his poor physical health ( he is morbidly obese, has much diff and pain when he ambulates and has to use bilateral hand canes to steady hs body as he walks. He completes his daily assessment first thing this morning and  On it he wrote " not  Yet" in anser to the question about if had any SI today and then he rated his depression, hopelessness and anxiety " 6/6/6", respectively. A HE is very passive and quiet....but will jojin in the conversation as he sits around his peers in the dayroom. The internist was here to see how he is doing and  Has ordered a BMP to be done and also asked nursing to work with pt so that he drinks more fluids ( he is given a pink pitcher full of ice water). Safety is in place and poc cont.

## 2014-08-29 NOTE — [facility] Suicide Risk Assessment (Signed)
[facility] INPATIENT:  Family/Significant Other Suicide Prevention Education  Suicide Prevention Education:  Patient Refusal for Family/Significant Other Suicide Prevention Education: The patient Zachary [name] has refused to provide written consent for family/significant other to be provided Family/Significant Other Suicide Prevention Education during admission and/or prior to discharge.  Physician notified. SPE reviewed with patient and brochure provided. Patient encouraged to return to hospital if having suicidal thoughts, patient verbalized his/her understanding and has no further questions at this time.   [name] 08/29/2014, 10:13 AM

## 2014-08-29 NOTE — Progress Notes (Signed)
[facility] MD Progress Note  08/29/2014 1:05 PM Zachary [name]  MRN:  [id number] Subjective: Patient states " I am still depressed, but a bit better , I still have suicidal thoughts.'  Objective:Patient seen and chart reviewed.Discussed patient with treatment team. Patient is a morbidly obese CM, who walks with the help of a cane , has multiple medical issues as well as Bipolar do, pTSD and anxiety do, presented this time with worsening depression as well as suicidal thoughts.   Pt today states some improvement with his affective sx. Pt however continues to struggle with suicidal thoughts . He has a lot of psychosocial stressors like medical issues , limited mobility and so on. Pt currently reports good effect from his medication regimen for his mood. He continues to struggle with sleep , discussed increasing his sleep medications. Pt agrees with plan. Per staff - no disruptive issues noted on the unit. Pt denies any side effects of medications.    Principal Problem: Bipolar disorder, current episode depressed, severe, without psychotic features Diagnosis:   Patient Active Problem List   Diagnosis Date Noted  . Bipolar disorder, current episode depressed, severe, without psychotic features [F31.4] 08/29/2014  . Morbid obesity [E66.01] 08/29/2014  . Anxiety state, unspecified [F41.1] 01/01/2013  . Posttraumatic stress disorder [F43.10] 01/01/2013   Total Time spent with patient: 30 minutes   Past Medical History:  Past Medical History  Diagnosis Date  . Obesity   . Bipolar 1 disorder, depressed   . PTSD (post-traumatic stress disorder)   . Arthritis   . Fibromyalgia   . Anxiety   . Hx of degenerative disc disease     Past Surgical History  Procedure Laterality Date  . Gastric bypass    . Cholecystectomy    . Appendectomy    . Tympanostomy tube placement    . Adenoidectomy     Family History:  Family History  Problem Relation Age of Onset  . Diabetes Father   . Cancer  Father    Social History:  History  Alcohol Use No    Comment: none     History  Drug Use No    Social History   Social History  . Marital Status: Single    Spouse Name: N/A  . Number of Children: N/A  . Years of Education: N/A   Social History Main Topics  . Smoking status: Never Smoker   . Smokeless tobacco: Never Used  . Alcohol Use: No     Comment: none  . Drug Use: No  . Sexual Activity: Yes    Birth Control/ Protection: Condom   Other Topics Concern  . None   Social History Narrative   Additional History:    Sleep: Poor  Appetite:  Fair    Musculoskeletal: Strength & Muscle Tone: within normal limits Gait & Station: walks with a cane Patient leans: N/A   Psychiatric Specialty Exam: Physical Exam  Review of Systems  Musculoskeletal: Positive for myalgias and joint pain.  Psychiatric/Behavioral: Positive for depression and suicidal ideas. The patient is nervous/anxious and has insomnia.   All other systems reviewed and are negative.   Blood pressure 138/84, pulse 96, temperature 98 F (36.7 C), temperature source Oral, resp. rate 20, height _0  (1.702 m), weight 147.873 kg (326 lb).Body mass index is 51.05 kg/(m^2).  General Appearance: Casual  Eye Contact::  Fair  Speech:  Normal Rate  Volume:  Decreased  Mood:  Anxious and Depressed  Affect:  reactive  Thought Process:  Intact  Orientation:  Full (Time, Place, and Person)  Thought Content:  Rumination  Suicidal Thoughts:  Yes.  without intent/plan  Homicidal Thoughts:  No  Memory:  Immediate;   Fair Recent;   Fair Remote;   Fair  Judgement:  Fair  Insight:  Fair  Psychomotor Activity:  Decreased  Concentration:  Fair  Recall:  [employer] of Knowledge:Fair  Language: Fair  Akathisia:  No  Handed:  Right  AIMS (if indicated):     Assets:  Desire for Improvement  ADL's:  Intact  Cognition: WNL  Sleep:  Number of Hours: 6     Current Medications: Current Facility-Administered  Medications  Medication Dose Route Frequency Provider Last Rate Last Dose  . acetaminophen (TYLENOL) tablet 650 mg  650 mg Oral Q6H PRN Evanna Burkett, NP      . alum & mag hydroxide-simeth (MAALOX/MYLANTA) 200-200-20 MG/5ML suspension 30 mL  30 mL Oral Q4H PRN Gypsy Lore, NP      . DULoxetine (CYMBALTA) DR capsule 30 mg  30 mg Oral Daily Jenne Campus, MD   30 mg at 08/29/14 7169  . ferrous sulfate tablet 325 mg  325 mg Oral Q breakfast Jenne Campus, MD   325 mg at 08/29/14 6789  . hydrOXYzine (ATARAX/VISTARIL) tablet 25 mg  25 mg Oral Q8H PRN Gypsy Lore, NP   25 mg at 08/29/14 0832  . lidocaine (LIDODERM) 5 % 2 patch  2 patch Transdermal Q24H Shuvon B Rankin, NP   2 patch at 08/28/14 2053  . magnesium hydroxide (MILK OF MAGNESIA) suspension 30 mL  30 mL Oral Daily PRN Evanna Burkett, NP      . mirtazapine (REMERON) tablet 15 mg  15 mg Oral QHS Gilad Dugger, MD      . Oxcarbazepine (TRILEPTAL) tablet 600 mg  600 mg Oral BID Jenne Campus, MD   600 mg at 08/29/14 3810  . traZODone (DESYREL) tablet 100 mg  100 mg Oral QHS PRN Ursula Alert, MD        Lab Results:  Results for orders placed or performed during the hospital encounter of 08/28/14 (from the past 48 hour(s))  CBC with Differential/Platelet     Status: Abnormal   Collection Time: 08/28/14  6:18 AM  Result Value Ref Range   WBC 4.2 4.0 - 10.5 K/uL   RBC 4.27 4.22 - 5.81 MIL/uL   Hemoglobin 8.9 (L) 13.0 - 17.0 g/dL   HCT 30.1 (L) 39.0 - 52.0 %   MCV 70.5 (L) 78.0 - 100.0 fL   MCH 20.8 (L) 26.0 - 34.0 pg   MCHC 29.6 (L) 30.0 - 36.0 g/dL   RDW 18.5 (H) 11.5 - 15.5 %   Platelets 107 (L) 150 - 400 K/uL    Comment: SPECIMEN CHECKED FOR CLOTS PLATELET COUNT CONFIRMED BY SMEAR    Neutrophils Relative % 66 43 - 77 %   Neutro Abs 2.7 1.7 - 7.7 K/uL   Lymphocytes Relative 24 12 - 46 %   Lymphs Abs 1.0 0.7 - 4.0 K/uL   Monocytes Relative 8 3 - 12 %   Monocytes Absolute 0.4 0.1 - 1.0 K/uL   Eosinophils Relative 2  0 - 5 %   Eosinophils Absolute 0.1 0.0 - 0.7 K/uL   Basophils Relative 0 0 - 1 %   Basophils Absolute 0.0 0.0 - 0.1 K/uL   Smear Review MORPHOLOGY UNREMARKABLE     Comment: Performed at [facility] [facility]  Status: Abnormal   Collection Time: 08/29/14  6:50 AM  Result Value Ref Range   WBC 4.5 4.0 - 10.5 K/uL   RBC 4.37 4.22 - 5.81 MIL/uL   Hemoglobin 9.0 (L) 13.0 - 17.0 g/dL   HCT 30.8 (L) 39.0 - 52.0 %   MCV 70.5 (L) 78.0 - 100.0 fL   MCH 20.6 (L) 26.0 - 34.0 pg   MCHC 29.2 (L) 30.0 - 36.0 g/dL   RDW 18.4 (H) 11.5 - 15.5 %   Platelets 133 (L) 150 - 400 K/uL    Comment: Performed at [place] metabolic panel     Status: Abnormal   Collection Time: 08/29/14  6:50 AM  Result Value Ref Range   Sodium 140 135 - 145 mmol/L   Potassium 3.9 3.5 - 5.1 mmol/L   Chloride 113 (H) 101 - 111 mmol/L   CO2 21 (L) 22 - 32 mmol/L   Glucose, Bld 106 (H) 65 - 99 mg/dL   BUN 20 6 - 20 mg/dL   Creatinine, Ser 1.25 (H) 0.61 - 1.24 mg/dL   Calcium 8.3 (L) 8.9 - 10.3 mg/dL   GFR calc non Af Amer >60 >60 mL/min   GFR calc Af Amer >60 >60 mL/min    Comment: (NOTE) The eGFR has been calculated using the CKD EPI equation. This calculation has not been validated in all clinical situations. eGFR's persistently <60 mL/min signify possible Chronic Kidney Disease.    Anion gap 6 5 - 15    Comment: Performed at [facility]    Physical Findings: AIMS: Facial and Oral Movements Muscles of Facial Expression: None, normal Lips and Perioral Area: None, normal Jaw: None, normal Tongue: None, normal,Extremity Movements Upper (arms, wrists, hands, fingers): None, normal Lower (legs, knees, ankles, toes): None, normal, Trunk Movements Neck, shoulders, hips: None, normal, Overall Severity Severity of abnormal movements (highest score from questions above): None, normal Incapacitation due to abnormal movements: None, normal Patient's  awareness of abnormal movements (rate only patient's report): No Awareness, Dental Status Current problems with teeth and/or dentures?: No Does patient usually wear dentures?: No  CIWA:    COWS:     Assessment:  Patient with some improvement of his affective sx. Pt continues to struggle with passive suicidal thoughts as well as sleep issues. Will readjust medications.    Treatment Plan Summary: Daily contact with patient to assess and evaluate symptoms and progress in treatment and Medication management Will continue Trileptal 600 mg po bid for mood sx. Will continue Remeron - reduce to 15 mg po qhs to help with sleep. Continue Cymbalta 30 mg po daily for affetcive sx. Will increase Trazodone 100 mg po qhs for sleep.  Will continue to monitor vitals ,medication compliance and treatment side effects while patient is here.  Will monitor for medical issues as well as call consult as needed.  Reviewed labs ,will order TSH.  Dietican consult for obesity. Hospitalist will continue to follow patient - see consult note .  CSW will start working on disposition.  Patient to participate in therapeutic milieu .       Medical Decision Making:  Review of Psycho-Social Stressors (1), Review or order clinical lab tests (1), Review of Last Therapy Session (1), Review of Medication Regimen & Side Effects (2) and Review of New Medication or Change in Dosage (2)     Jerel Sardina md 08/29/2014, 1:05 PM

## 2014-08-30 LAB — BASIC METABOLIC PANEL
Anion gap: 4 — ABNORMAL LOW (ref 5–15)
BUN: 20 mg/dL (ref 6–20)
CALCIUM: 8 mg/dL — AB (ref 8.9–10.3)
CO2: 21 mmol/L — AB (ref 22–32)
CREATININE: 1.05 mg/dL (ref 0.61–1.24)
Chloride: 113 mmol/L — ABNORMAL HIGH (ref 101–111)
GFR calc Af Amer: >60 mL/min (ref >60)
GFR calc non Af Amer: >60 mL/min (ref >60)
GLUCOSE: 90 mg/dL (ref 65–99)
Potassium: 3.9 mmol/L (ref 3.5–5.1)
Sodium: 138 mmol/L (ref 135–145)

## 2014-08-30 LAB — PTH, INTACT AND CALCIUM
Calcium, Total (PTH): 8 mg/dL — ABNORMAL LOW (ref 8.7–10.2)
PTH: 79 pg/mL — AB (ref 15–65)

## 2014-08-30 LAB — TSH: TSH: 1.477 u[IU]/mL (ref 0.350–4.500)

## 2014-08-30 NOTE — Progress Notes (Signed)
[name] remains depressed as evidenced by his flat affect, his disengagement in the unit program, his continued negative self talk and negative attitudes, his resistance to allowing himself to accept a positive message.   A He remains a HIGH FALL RISK and is encouraged by this writer to amb slowly and exercise caution to rpevent falls. He takes his meds as scheduled and he attends his groups. HE tries to resist being positive and uses several " but if. " questions during group to cast doubt into  Positive mssage of Coices / lIfe Skills . ..[name]   R Safety is in place and poc to cont to offer support and maintain therapeutic relationship.

## 2014-08-30 NOTE — Progress Notes (Signed)
D: Pt at this time is alert and oriented x4. Pt uses two canes-one on each side to relieve chronic bilateral knee pain; Pt state severe bilateral knee pain of 8 on a 0-10 pain scale. He states, "These canes do a good job relieving my pain but it can only work for so long; my knees are very sore now." Pt also endorses mild depression and anxiety; however denies SI/HI/AVH. Pt was very appropriate, cooperative and pleasant through his shift assessment.  A: Support, encouragement, and safe environment provided.  15-minute safety checks continue.  R: Attended group. Safety checks continue.

## 2014-08-30 NOTE — Progress Notes (Signed)
Adult Psychoeducational Group Note  Date:  08/30/2014 Time:  9:43 PM  Group Topic/Focus:  Wrap-Up Group:   The focus of this group is to help patients review their daily goal of treatment and discuss progress on daily workbooks.  Participation Level:  Active  Participation Quality:  Appropriate  Affect:  Appropriate  Cognitive:  Appropriate  Insight: Appropriate  Engagement in Group:  Engaged  Modes of Intervention:  Discussion  Additional Comments:  The patient expressed that he attended group.The patient said that he rates his day a 6 out of 10.  Zachary [name]  08/30/2014, 9:43 PM

## 2014-08-30 NOTE — Plan of Care (Signed)
Problem: Consults Goal: Suicide Risk Patient Education (See Patient Education module for education specifics)  Outcome: Progressing This RN educated pt about s/sx of suicide and how they present in depressed patients.

## 2014-08-30 NOTE — [facility] Group Notes (Signed)
CSW was unable to hold group today due to patients being involved in another activity at group time.  Yanci Bachtell Grossman-Orr, LCSW 08/30/2014, 3:34 PM   

## 2014-08-30 NOTE — Progress Notes (Signed)
Psychoeducational Group Note  Date:  08/30/2014 Time: 1315  Group Topic/Focus:  Identifying Needs:   The focus of this group is to help patients identify their personal needs that have been historically problematic and identify healthy behaviors to address their needs.  Participation Level:  Active  Participation Quality:  Attentive  Affect:  Depressed  Cognitive:  Alert  Insight:  Engaged  Engagement in Group:  Engaged  Additional Comments:    08/30/2014,6:49 PM Mareon Robinette, Joie Bimler

## 2014-08-30 NOTE — Progress Notes (Signed)
Psychoeducational Group Note  Date: 08/30/2014 Time: 1015  Group Topic/Focus:  Identifying Needs:   The focus of this group is to help patients identify their personal needs that have been historically problematic and identify healthy behaviors to address their needs.  Participation Level:  Minimal  Participation Quality:  Attentive  Affect:  Flat  Cognitive:  Alert  Insight:  Engaged  Engagement in Group:  Engaged  Additional Comments:    8/27/201612:57 PM Inez Stantz, Joie Bimler

## 2014-08-30 NOTE — Progress Notes (Signed)
Patient ID: Zachary [name], male   DOB: 06/14/1970, 44 y.o.   MRN: [id number] [facility] MD Progress Note  08/30/2014 4:10 PM Zachary [name]  MRN:  [id number]  Subjective: Patient states "I'm still depressed & feeling suicidal, but I'm also trying to re-arrange my thoughts"  Objective: Zachary [name] says he is here because of self injurious behavior & high anxiety levels. He says he is tired of being in pain all the time. And that was the reason he relapsed in cutting on himself again. He says he started a new medication, just waiting for it to kick-in. He says he is still having suicidal thoughts. Denies any plans or intent to hurt himself. Says has a disability hearing coming up. Says he is nervous about it. He walks with 2 canes for support. His resentTSH result is 1.477.  Principal Problem: Bipolar disorder, current episode depressed, severe, without psychotic features  Diagnosis:   Patient Active Problem List   Diagnosis Date Noted  . Bipolar disorder, current episode depressed, severe, without psychotic features [F31.4] 08/29/2014  . Morbid obesity [E66.01] 08/29/2014  . Hypocalcemia [E83.51]   . AKI (acute kidney injury) [N17.9]   . Benign essential HTN [I10]   . Anxiety state, unspecified [F41.1] 01/01/2013  . Posttraumatic stress disorder [F43.10] 01/01/2013   Total Time spent with patient: 25 minutes   Past Medical History:  Past Medical History  Diagnosis Date  . Obesity   . Bipolar 1 disorder, depressed   . PTSD (post-traumatic stress disorder)   . Arthritis   . Fibromyalgia   . Anxiety   . Hx of degenerative disc disease     Past Surgical History  Procedure Laterality Date  . Gastric bypass    . Cholecystectomy    . Appendectomy    . Tympanostomy tube placement    . Adenoidectomy     Family History:  Family History  Problem Relation Age of Onset  . Diabetes Father   . Cancer Father    Social History:  History  Alcohol Use No    Comment: none     History  Drug  Use No    Social History   Social History  . Marital Status: Single    Spouse Name: N/A  . Number of Children: N/A  . Years of Education: N/A   Social History Main Topics  . Smoking status: Never Smoker   . Smokeless tobacco: Never Used  . Alcohol Use: No     Comment: none  . Drug Use: No  . Sexual Activity: Yes    Birth Control/ Protection: Condom   Other Topics Concern  . None   Social History Narrative   Additional History:    Sleep: " Poor" per patient, however, hours asleep: 6 per documentation  Appetite:  Fair  Musculoskeletal: Strength & Muscle Tone: within normal limits Gait & Station: walks with a cane Patient leans: N/A  Psychiatric Specialty Exam: Physical Exam  Review of Systems  Musculoskeletal: Positive for myalgias and joint pain.  Psychiatric/Behavioral: Positive for depression and suicidal ideas. The patient is nervous/anxious and has insomnia.   All other systems reviewed and are negative.   Blood pressure 138/84, pulse 96, temperature 98 F (36.7 C), temperature source Oral, resp. rate 20, height 5' 7"  (1.702 m), weight 147.873 kg (326 lb).Body mass index is 51.05 kg/(m^2).  General Appearance: Casual  Eye Contact::  Fair  Speech:  Normal Rate  Volume:  Decreased  Mood:  Anxious and Depressed  Affect:  reactive  Thought Process:  Intact  Orientation:  Full (Time, Place, and Person)  Thought Content:  Rumination  Suicidal Thoughts:  Yes.  without intent/plan  Homicidal Thoughts:  No  Memory:  Immediate;   Fair Recent;   Fair Remote;   Fair  Judgement:  Fair  Insight:  Fair  Psychomotor Activity:  Decreased  Concentration:  Fair  Recall:  [employer] of Knowledge:Fair  Language: Fair  Akathisia:  No  Handed:  Right  AIMS (if indicated):     Assets:  Desire for Improvement  ADL's:  Intact  Cognition: WNL  Sleep:  Number of Hours: 6   Current Medications: Current Facility-Administered Medications  Medication Dose Route Frequency  Provider Last Rate Last Dose  . acetaminophen (TYLENOL) tablet 650 mg  650 mg Oral Q6H PRN Evanna Burkett, NP      . alum & mag hydroxide-simeth (MAALOX/MYLANTA) 200-200-20 MG/5ML suspension 30 mL  30 mL Oral Q4H PRN Gypsy Lore, NP      . DULoxetine (CYMBALTA) DR capsule 30 mg  30 mg Oral Daily Jenne Campus, MD   30 mg at 08/30/14 0820  . ferrous sulfate tablet 325 mg  325 mg Oral Q breakfast Jenne Campus, MD   325 mg at 08/30/14 0820  . hydrOXYzine (ATARAX/VISTARIL) tablet 25 mg  25 mg Oral Q8H PRN Gypsy Lore, NP   25 mg at 08/29/14 0832  . lidocaine (LIDODERM) 5 % 2 patch  2 patch Transdermal Q24H Shuvon B Rankin, NP   2 patch at 08/28/14 2053  . magnesium hydroxide (MILK OF MAGNESIA) suspension 30 mL  30 mL Oral Daily PRN Evanna Burkett, NP      . mirtazapine (REMERON) tablet 15 mg  15 mg Oral QHS Ursula Alert, MD   15 mg at 08/29/14 2140  . Oxcarbazepine (TRILEPTAL) tablet 600 mg  600 mg Oral BID Jenne Campus, MD   600 mg at 08/30/14 0820  . traZODone (DESYREL) tablet 100 mg  100 mg Oral QHS PRN Ursula Alert, MD   100 mg at 08/29/14 2227   Lab Results:  Results for orders placed or performed during the hospital encounter of 08/28/14 (from the past 48 hour(s))  PTH, intact and calcium     Status: Abnormal   Collection Time: 08/28/14  7:20 PM  Result Value Ref Range   PTH 79 (H) 15 - 65 pg/mL   Calcium, Total (PTH) 8.0 (L) 8.7 - 10.2 mg/dL   PTH Comment     Comment: (NOTE) Interpretation                 Intact PTH    Calcium                                (pg/mL)      (mg/dL) Normal                          15 - 65     8.6 - 10.2 Primary Hyperparathyroidism         >65          >10.2 Secondary Hyperparathyroidism       >65          <10.2 Non-Parathyroid Hypercalcemia       <65          >10.2 Hypoparathyroidism                  <  15          < 8.6 Non-Parathyroid Hypocalcemia    15 - 65          < 8.6 Performed At: [facility] [place], [place] [id number] Lindon Romp MD WJ:[id number] Performed at [facility]   CBC     Status: Abnormal   Collection Time: 08/29/14  6:50 AM  Result Value Ref Range   WBC 4.5 4.0 - 10.5 K/uL   RBC 4.37 4.22 - 5.81 MIL/uL   Hemoglobin 9.0 (L) 13.0 - 17.0 g/dL   HCT 30.8 (L) 39.0 - 52.0 %   MCV 70.5 (L) 78.0 - 100.0 fL   MCH 20.6 (L) 26.0 - 34.0 pg   MCHC 29.2 (L) 30.0 - 36.0 g/dL   RDW 18.4 (H) 11.5 - 15.5 %   Platelets 133 (L) 150 - 400 K/uL    Comment: Performed at [facility] metabolic panel     Status: Abnormal   Collection Time: 08/29/14  6:50 AM  Result Value Ref Range   Sodium 140 135 - 145 mmol/L   Potassium 3.9 3.5 - 5.1 mmol/L   Chloride 113 (H) 101 - 111 mmol/L   CO2 21 (L) 22 - 32 mmol/L   Glucose, Bld 106 (H) 65 - 99 mg/dL   BUN 20 6 - 20 mg/dL   Creatinine, Ser 1.25 (H) 0.61 - 1.24 mg/dL   Calcium 8.3 (L) 8.9 - 10.3 mg/dL   GFR calc non Af Amer >60 >60 mL/min   GFR calc Af Amer >60 >60 mL/min    Comment: (NOTE) The eGFR has been calculated using the CKD EPI equation. This calculation has not been validated in all clinical situations. eGFR's persistently <60 mL/min signify possible Chronic Kidney Disease.    Anion gap 6 5 - 15    Comment: Performed at [place] metabolic panel     Status: Abnormal   Collection Time: 08/30/14  6:30 AM  Result Value Ref Range   Sodium 138 135 - 145 mmol/L   Potassium 3.9 3.5 - 5.1 mmol/L   Chloride 113 (H) 101 - 111 mmol/L   CO2 21 (L) 22 - 32 mmol/L   Glucose, Bld 90 65 - 99 mg/dL   BUN 20 6 - 20 mg/dL   Creatinine, Ser 1.05 0.61 - 1.24 mg/dL   Calcium 8.0 (L) 8.9 - 10.3 mg/dL   GFR calc non Af Amer >60 >60 mL/min   GFR calc Af Amer >60 >60 mL/min    Comment: (NOTE) The eGFR has been calculated using the CKD EPI equation. This calculation has not been validated in all clinical situations. eGFR's persistently <60 mL/min signify possible  Chronic Kidney Disease.    Anion gap 4 (L) 5 - 15    Comment: Performed at [facility]  TSH     Status: None   Collection Time: 08/30/14  6:30 AM  Result Value Ref Range   TSH 1.477 0.350 - 4.500 uIU/mL    Comment: Performed at [facility]   Physical Findings:  AIMS: Facial and Oral Movements Muscles of Facial Expression: None, normal Lips and Perioral Area: None, normal Jaw: None, normal Tongue: None, normal,Extremity Movements Upper (arms, wrists, hands, fingers): None, normal Lower (legs, knees, ankles, toes): None, normal, Trunk Movements Neck, shoulders, hips: None, normal, Overall Severity Severity of abnormal movements (highest score from questions above): None, normal Incapacitation  due to abnormal movements: None, normal Patient's awareness of abnormal movements (rate only patient's report): No Awareness, Dental Status Current problems with teeth and/or dentures?: No Does patient usually wear dentures?: No  CIWA:    COWS:     Assessment:  Bipolar disorder, current episode depressed, severe, without psychotic features, PTSD  Treatment Plan Summary: Daily contact with patient to assess and evaluate symptoms and progress in treatment and Medication management Will continue Trileptal 600 mg po bid for mood symptoms. Continue Remeron 15 mg po qhs for insomnia. Continue Cymbalta 30 mg po daily for affetcive sx. ContinueTrazodone 100 mg po qhs for sleep.  Will continue to monitor vitals ,medication compliance and treatment side effects while patient is here.  Will monitor for medical issues as well as call consult as needed.  Reviewed lab result, TSH value within normal rage at 1.477.  Dietican consult for obesity. Hospitalist will continue to follow patient - see consult note .  CSW will start working on disposition.  Patient to participate in therapeutic milieu .   Medical Decision Making:  Review of Psycho-Social Stressors (1),  Review or order clinical lab tests (1), Review of Last Therapy Session (1), Review of Medication Regimen & Side Effects (2) and Review of New Medication or Change in Dosage (2)  Encarnacion Slates, PMHNP, FNP-BC 08/30/2014, 4:10 PM I agree with assessment and plan Geralyn Flash A. Sabra Heck, M.D.

## 2014-08-30 NOTE — Progress Notes (Signed)
[facility] Progress Note   DEMETRIE POTOSKY ZOX:[id number] DOB: 1970-07-17 DOA: 08/28/2014 PCP: Dartha Lodge, FNP    [facility] were consulted by Psychiatry for elevated PTH in the past  Subjective: Patient not examined today. He was examined on 8/25 and 8/26 and plan was discussed in detail.   Assessment/Plan: Principal Problem:   Bipolar disorder, current episode depressed, severe, without psychotic features - per psych  Active Problems:  Elevated PTH/ low calcium - when last check in 07/2013 it was 4457 - PTH rechecked and found to be slightly elevated at  79 concurrent Ca 8.0 - will need outpt endocrine referral by PCP  Low Bicarb - improving - at 21 to past 2 days- possibly secondary to mild hyperchloremia- no further work up at this time  ARF --Cr has stablized -- encouraged to drink more fluids   HTN - HCTZ and Lisinopril on hold by psych - BPs not being checked at this time- recommend check BP BID and resume meds if elevated  AOCD  - cont outpt follow up  Chronic thrombocytopenia - stable    Morbid obesity  [facility] will now sign off: please call if any questions or assistance needed  Code Status:     Code Status Orders        Start     Ordered   08/28/14 0534  Full code   Continuous     08/28/14 0533      Antibiotics: Anti-infectives    None      Objective: Filed Weights   08/28/14 0533  Weight: 147.873 kg (326 lb)   No intake or output data in the 24 hours ending 08/30/14 1337   Vitals Filed Vitals:   08/28/14 0533 08/29/14 0700 08/29/14 0701  BP: 146/86 143/78 138/84  Pulse: 83 76 96  Temp: 98.7 F (37.1 C) 98 F (36.7 C)   TempSrc: Oral    Resp: 18 20   Height: 5\' 7"  (1.702 m)    Weight: 147.873 kg (326 lb)        Data Reviewed: Basic Metabolic Panel:  Recent Labs Lab 08/26/14 1216 08/28/14 1920 08/29/14 0650 08/30/14 0630  NA 139  --  140 138  K 3.8  --  3.9 3.9  CL 116*  --  113*  113*  CO2 18*  --  21* 21*  GLUCOSE 98  --  106* 90  BUN 13  --  20 20  CREATININE 1.11  --  1.25* 1.05  CALCIUM 7.8* 8.0* 8.3* 8.0*   Liver Function Tests:  Recent Labs Lab 08/26/14 1216  AST 20  ALT 18  ALKPHOS 249*  BILITOT 0.2*  PROT 6.8  ALBUMIN 3.5   No results for input(s): LIPASE, AMYLASE in the last 168 hours. No results for input(s): AMMONIA in the last 168 hours. CBC:  Recent Labs Lab 08/26/14 1216 08/28/14 0618 08/29/14 0650  WBC 4.2 4.2 4.5  NEUTROABS 2.6 2.7  --   HGB 8.4* 8.9* 9.0*  HCT 28.7* 30.1* 30.8*  MCV 70.2* 70.5* 70.5*  PLT 159 107* 133*   Cardiac Enzymes: No results for input(s): CKTOTAL, CKMB, CKMBINDEX, TROPONINI in the last 168 hours. BNP (last 3 results) No results for input(s): BNP in the last 8760 hours.  ProBNP (last 3 results) No results for input(s): PROBNP in the last 8760 hours.  CBG: No results for input(s): GLUCAP in the last 168 hours.  No results found for this or any previous visit (from the past 240  hour(s)).   Studies: No results found.  Scheduled Meds:  Scheduled Meds: . DULoxetine  30 mg Oral Daily  . ferrous sulfate  325 mg Oral Q breakfast  . lidocaine  2 patch Transdermal Q24H  . mirtazapine  15 mg Oral QHS  . oxcarbazepine  600 mg Oral BID   Continuous Infusions:   Time spent on care of this patient: 35  min   Maia Handa, MD 08/30/2014, 1:37 PM  LOS: 2 days   [facility] Office  [phone] Pager - Text Page per [email or web address] If 7PM-7AM, please contact night-coverage [email or web address]

## 2014-08-31 NOTE — Progress Notes (Signed)
Patient ID: Zachary [name], male   DOB: July 08, 1970, 44 y.o.   MRN: [id number] Patient ID: Zachary [name], male   DOB: 1970-02-23, 44 y.o.   MRN: [id number] [facility] MD Progress Note  08/31/2014 4:42 PM PACO CISLO  MRN:  [id number]  Subjective: Birt reports, "It is going well today. I really enjoyed our group session this morning. It is informative. I'm learning new ways to look at things from here on. My mood is better. I slept better last night". Denies any new issues or concerns.   Objective: Jaevin says he is here because of self injurious behavior & high anxiety levels. He says he is tired of being in pain all the time. And that was the reason he relapsed in cutting on himself again. He says he started a new medication, just waiting for it to kick-in. He says his mood is better today & continues to improve. Denies any plans or intent to hurt himself. Says has a disability hearing coming up. Says he is nervous about it. He walks with 2 canes for support. His resentTSH result is 1.477.  Principal Problem: Bipolar disorder, current episode depressed, severe, without psychotic features  Diagnosis:   Patient Active Problem List   Diagnosis Date Noted  . Bipolar disorder, current episode depressed, severe, without psychotic features [F31.4] 08/29/2014  . Morbid obesity [E66.01] 08/29/2014  . Hypocalcemia [E83.51]   . AKI (acute kidney injury) [N17.9]   . Benign essential HTN [I10]   . Anxiety state, unspecified [F41.1] 01/01/2013  . Posttraumatic stress disorder [F43.10] 01/01/2013   Total Time spent with patient: 15 minutes  Past Medical History:  Past Medical History  Diagnosis Date  . Obesity   . Bipolar 1 disorder, depressed   . PTSD (post-traumatic stress disorder)   . Arthritis   . Fibromyalgia   . Anxiety   . Hx of degenerative disc disease     Past Surgical History  Procedure Laterality Date  . Gastric bypass    . Cholecystectomy    . Appendectomy    . Tympanostomy  tube placement    . Adenoidectomy     Family History:  Family History  Problem Relation Age of Onset  . Diabetes Father   . Cancer Father    Social History:  History  Alcohol Use No    Comment: none     History  Drug Use No    Social History   Social History  . Marital Status: Single    Spouse Name: N/A  . Number of Children: N/A  . Years of Education: N/A   Social History Main Topics  . Smoking status: Never Smoker   . Smokeless tobacco: Never Used  . Alcohol Use: No     Comment: none  . Drug Use: No  . Sexual Activity: Yes    Birth Control/ Protection: Condom   Other Topics Concern  . None   Social History Narrative   Additional History:    Sleep: " Good"   Appetite:  Fair  Musculoskeletal: Strength & Muscle Tone: within normal limits Gait & Station: unsteady, walks with a cane Patient leans: N/A  Psychiatric Specialty Exam: Physical Exam  Review of Systems  Musculoskeletal: Positive for myalgias and joint pain.  Psychiatric/Behavioral: Positive for depression and suicidal ideas. The patient is nervous/anxious and has insomnia.   All other systems reviewed and are negative.   Blood pressure 135/95, pulse 78, temperature 97.9 F (36.6 C), temperature source Oral, resp. rate 18,  height _0  (1.702 m), weight 147.873 kg (326 lb).Body mass index is 51.05 kg/(m^2).  General Appearance: Casual  Eye Contact::  Fair  Speech:  Normal Rate  Volume:  Decreased  Mood:  Anxious and Depressed  Affect:  reactive  Thought Process:  Intact  Orientation:  Full (Time, Place, and Person)  Thought Content:  Rumination  Suicidal Thoughts:  Yes.  without intent/plan  Homicidal Thoughts:  No  Memory:  Immediate;   Fair Recent;   Fair Remote;   Fair  Judgement:  Fair  Insight:  Fair  Psychomotor Activity:  Decreased  Concentration:  Fair  Recall:  [employer] of Knowledge:Fair  Language: Fair  Akathisia:  No  Handed:  Right  AIMS (if indicated):     Assets:   Desire for Improvement  ADL's:  Intact  Cognition: WNL  Sleep:  Number of Hours: 6.75   Current Medications: Current Facility-Administered Medications  Medication Dose Route Frequency Provider Last Rate Last Dose  . acetaminophen (TYLENOL) tablet 650 mg  650 mg Oral Q6H PRN Evanna Burkett, NP      . alum & mag hydroxide-simeth (MAALOX/MYLANTA) 200-200-20 MG/5ML suspension 30 mL  30 mL Oral Q4H PRN Gypsy Lore, NP      . DULoxetine (CYMBALTA) DR capsule 30 mg  30 mg Oral Daily Jenne Campus, MD   30 mg at 08/31/14 0809  . ferrous sulfate tablet 325 mg  325 mg Oral Q breakfast Jenne Campus, MD   325 mg at 08/31/14 0809  . hydrOXYzine (ATARAX/VISTARIL) tablet 25 mg  25 mg Oral Q8H PRN Gypsy Lore, NP   25 mg at 08/30/14 2123  . lidocaine (LIDODERM) 5 % 2 patch  2 patch Transdermal Q24H Shuvon B Rankin, NP   2 patch at 08/30/14 2122  . magnesium hydroxide (MILK OF MAGNESIA) suspension 30 mL  30 mL Oral Daily PRN Evanna Burkett, NP      . mirtazapine (REMERON) tablet 15 mg  15 mg Oral QHS Ursula Alert, MD   15 mg at 08/30/14 2123  . Oxcarbazepine (TRILEPTAL) tablet 600 mg  600 mg Oral BID Jenne Campus, MD   600 mg at 08/31/14 0808  . traZODone (DESYREL) tablet 100 mg  100 mg Oral QHS PRN Ursula Alert, MD   100 mg at 08/30/14 2123   Lab Results:  Results for orders placed or performed during the hospital encounter of 08/28/14 (from the past 48 hour(s))  Basic metabolic panel     Status: Abnormal   Collection Time: 08/30/14  6:30 AM  Result Value Ref Range   Sodium 138 135 - 145 mmol/L   Potassium 3.9 3.5 - 5.1 mmol/L   Chloride 113 (H) 101 - 111 mmol/L   CO2 21 (L) 22 - 32 mmol/L   Glucose, Bld 90 65 - 99 mg/dL   BUN 20 6 - 20 mg/dL   Creatinine, Ser 1.05 0.61 - 1.24 mg/dL   Calcium 8.0 (L) 8.9 - 10.3 mg/dL   GFR calc non Af Amer >60 >60 mL/min   GFR calc Af Amer >60 >60 mL/min    Comment: (NOTE) The eGFR has been calculated using the CKD EPI equation. This  calculation has not been validated in all clinical situations. eGFR's persistently <60 mL/min signify possible Chronic Kidney Disease.    Anion gap 4 (L) 5 - 15    Comment: Performed at [facility]  TSH     Status: None   Collection  Time: 08/30/14  6:30 AM  Result Value Ref Range   TSH 1.477 0.350 - 4.500 uIU/mL    Comment: Performed at [facility]   Physical Findings:  AIMS: Facial and Oral Movements Muscles of Facial Expression: None, normal Lips and Perioral Area: None, normal Jaw: None, normal Tongue: None, normal,Extremity Movements Upper (arms, wrists, hands, fingers): None, normal Lower (legs, knees, ankles, toes): None, normal, Trunk Movements Neck, shoulders, hips: None, normal, Overall Severity Severity of abnormal movements (highest score from questions above): None, normal Incapacitation due to abnormal movements: None, normal Patient's awareness of abnormal movements (rate only patient's report): No Awareness, Dental Status Current problems with teeth and/or dentures?: No Does patient usually wear dentures?: No  CIWA:    COWS:     Assessment:  Bipolar disorder, current episode depressed, severe, without psychotic features, PTSD  Treatment Plan Summary: Daily contact with patient to assess and evaluate symptoms and progress in treatment and Medication management Will continue Trileptal 600 mg po bid for mood symptoms. Continue Remeron 15 mg po qhs for insomnia. Continue Cymbalta 30 mg po daily for affetcive sx. ContinueTrazodone 100 mg po qhs for sleep.  Will continue to monitor vitals ,medication compliance and treatment side effects while patient is here.  Will monitor for medical issues as well as call consult as needed.  Reviewed lab result, TSH value within normal rage at 1.477.  Dietican consult for obesity. Hospitalist will continue to follow patient - see consult note .  CSW will start working on disposition.   Patient to participate in therapeutic milieu .   Medical Decision Making:  Review of Psycho-Social Stressors (1), Review or order clinical lab tests (1), Review of Last Therapy Session (1), Review of Medication Regimen & Side Effects (2) and Review of New Medication or Change in Dosage (2)  Encarnacion Slates, PMHNP, FNP-BC 08/31/2014, 4:42 PM I agree with assessment and plan Geralyn Flash A. Sabra Heck, M.D.

## 2014-08-31 NOTE — [facility] Group Notes (Signed)
[facility] Group Notes:  (Clinical Social Work)  08/31/2014  1:15-2:15PM  Summary of Progress/Problems:   The main focus of today's process group was to   1)  discuss the importance of adding supports  2)  define health supports versus unhealthy supports  3)  identify the patient's current unhealthy supports and plan how to handle them  4)  Identify the patient's current healthy supports and plan what to add.  An emphasis was placed on using counselor, doctor, therapy groups, 12-step groups, and problem-specific support groups to expand supports.    The patient expressed full comprehension of the concepts presented, and agreed that there is a need to add more supports.  The patient stated he has friends who are closer to them than his family, and he considers them his main supports.  Later in group he talked about not getting pain meds that were helpful and when CSW did some psychoeducation about the limitations of medication and other options that are available, he did concede that there are federal regulations about prescriptions that are meant to protect consumers.  He was very supportive of others.  Type of Therapy:  Process Group with Motivational Interviewing  Participation Level:  Active  Participation Quality:  Attentive, Sharing and Supportive  Affect:  Blunted and Depressed  Cognitive:  Alert, Appropriate and Oriented  Insight:  Engaged  Engagement in Therapy:  Engaged  Modes of Intervention:   Education, Support and Processing, Activity  Ambrose Mantle, LCSW 08/31/2014

## 2014-08-31 NOTE — Progress Notes (Signed)
D: Patient pleasant and cooperative with assessment. Pt interacting well with peers in the milieu. Pt had a good visit tonight with family/visitors.  A: Q 15 minute safety checks, encourage staff/peer interaction, group participation, and medication compliance.  R: Patient compliant with medications and group session. Pt denies SI or plans.

## 2014-08-31 NOTE — Progress Notes (Signed)
Psychoeducational Group Note  Date: 08/31/2014 Time:  0930 Group Topic/Focus:  Gratefulness:  The focus of this group is to help patients identify what two things they are most grateful for in their lives. What helps ground them and to center them on their work to their recovery.  Participation Level:  Active  Participation Quality:  Appropriate  Affect:  Appropriate  Cognitive:  Oriented  Insight:  Improving  Engagement in Group:  Engaged  Additional Comments:    Dantae Meunier A  

## 2014-08-31 NOTE — Progress Notes (Signed)
Psychoeducational Group Note  Date:  08/31/2014 Time:  1015  Group Topic/Focus:  Making Healthy Choices:   The focus of this group is to help patients identify negative/unhealthy choices they were using prior to admission and identify positive/healthier coping strategies to replace them upon discharge.  Participation Level:  Active  Participation Quality:  Appropriate  Affect:  Appropriate  Cognitive:  Appropriate  Insight:  Improving  Engagement in Group:  Engaged  Additional Comments:    Valton Schwartz A 08/31/2014 

## 2014-09-01 MED ORDER — TRAZODONE HCL 150 MG PO TABS
150.0000 mg | ORAL_TABLET | Freq: Every day | ORAL | Status: DC
  Administered 2014-09-01: 150 mg via ORAL
  Filled 2014-09-01: qty 7
  Filled 2014-09-01 (×2): qty 1

## 2014-09-01 MED ORDER — PREGABALIN 25 MG PO CAPS
25.0000 mg | ORAL_CAPSULE | Freq: Two times a day (BID) | ORAL | Status: DC
  Administered 2014-09-01 – 2014-09-02 (×2): 25 mg via ORAL
  Filled 2014-09-01 (×2): qty 1

## 2014-09-01 MED ORDER — ENSURE ENLIVE PO LIQD
237.0000 mL | Freq: Two times a day (BID) | ORAL | Status: DC
  Administered 2014-09-01 – 2014-09-02 (×2): 237 mL via ORAL

## 2014-09-01 MED ORDER — LISINOPRIL 10 MG PO TABS
10.0000 mg | ORAL_TABLET | Freq: Every day | ORAL | Status: DC
  Administered 2014-09-01 – 2014-09-02 (×2): 10 mg via ORAL
  Filled 2014-09-01 (×4): qty 1

## 2014-09-01 NOTE — [facility] Group Notes (Signed)
[facility] LCSW Group Therapy 09/01/2014  1:15 pm  Type of Therapy: Group Therapy Participation Level: Active  Participation Quality: Attentive, Sharing and Supportive  Affect: Appropriate  Cognitive: Alert and Oriented  Insight: Developing/Improving and Engaged  Engagement in Therapy: Developing/Improving and Engaged  Modes of Intervention: Clarification, Confrontation, Discussion, Education, Exploration,  Limit-setting, Orientation, Problem-solving, Rapport Building, Dance movement psychotherapist, Socialization and Support  Summary of Progress/Problems: Pt identified obstacles faced currently and processed barriers involved in overcoming these obstacles. Pt identified steps necessary for overcoming these obstacles and explored motivation (internal and external) for facing these difficulties head on. Pt further identified one area of concern in their lives and chose a goal to focus on for today. Patient identified difficulty communicating with his partner and being honest about his illness as an obstacle. Patient reports that he is fearful that she may leave or judge him. CSW and other group members provided patient with emotional support and encouragement.  Samuella Bruin, MSW, [employer] Clinical Social Worker [facility] [phone]

## 2014-09-01 NOTE — Plan of Care (Signed)
Problem: Alteration in mood Goal: LTG-Pt's behavior demonstrates decreased signs of depression (Patient's behavior demonstrates decreased signs of depression to the point the patient is safe to return home and continue treatment in an outpatient setting)  Outcome: Progressing Pt is attending groups and interacting with peers on the unit.

## 2014-09-01 NOTE — Progress Notes (Signed)
Patient ID: TAVIST HUDNALL, male   DOB: [birth year], 44 y.o.   MRN: [id number] Patient ID: REYNOL SAMONS, male   DOB: Aug 18, 1970, 44 y.o.   MRN: [id number] [facility] MD Progress Note  09/01/2014 2:38 PM DAVONTA POSTHUMA  MRN:  [id number]  Subjective: Ogheneruno reports, "It is going well today. I am hoping to go home soon. I had only one manic episode in my life time , all the other times I have been depressed and not manic. I do have sleep issues. I did not sleep at all last night.'    Objective: Patient seen and chart reviewed.Discussed patient with treatment team.  Gage is morbidly obese , has multiple medical issues , walks with the assistance of two canes on both hands , has limited mobility due to that. Pt has been tolerating his medications well. He does appear to be more reactive , smiles often when spoken to. However continues to have sleep issues. Pt is on Remeron as well as Trazodone prn. Pt also with pain issues at night , as well as bladder issues ( need to use rest room several times at night) - these are other factors that could be affecting his sleep. Discussed sleep hygiene as well as limiting fluids right before bedtimes, using ear plugs and limiting day time naps .    Principal Problem: Bipolar disorder, current episode depressed, severe, without psychotic features  Diagnosis:   Patient Active Problem List   Diagnosis Date Noted  . Bipolar disorder, current episode depressed, severe, without psychotic features [F31.4] 08/29/2014  . Morbid obesity [E66.01] 08/29/2014  . Hypocalcemia [E83.51]   . AKI (acute kidney injury) [N17.9]   . Benign essential HTN [I10]   . Anxiety state, unspecified [F41.1] 01/01/2013  . Posttraumatic stress disorder [F43.10] 01/01/2013   Total Time spent with patient: 30 minutes  Past Medical History:  Past Medical History  Diagnosis Date  . Obesity   . Bipolar 1 disorder, depressed   . PTSD (post-traumatic stress disorder)   . Arthritis   .  Fibromyalgia   . Anxiety   . Hx of degenerative disc disease     Past Surgical History  Procedure Laterality Date  . Gastric bypass    . Cholecystectomy    . Appendectomy    . Tympanostomy tube placement    . Adenoidectomy     Family History:  Family History  Problem Relation Age of Onset  . Diabetes Father   . Cancer Father    Social History:  History  Alcohol Use No    Comment: none     History  Drug Use No    Social History   Social History  . Marital Status: Single    Spouse Name: N/A  . Number of Children: N/A  . Years of Education: N/A   Social History Main Topics  . Smoking status: Never Smoker   . Smokeless tobacco: Never Used  . Alcohol Use: No     Comment: none  . Drug Use: No  . Sexual Activity: Yes    Birth Control/ Protection: Condom   Other Topics Concern  . None   Social History Narrative   Additional History:    Sleep: " Poor"   Appetite:  Fair  Musculoskeletal: Strength & Muscle Tone: within normal limits Gait & Station: unsteady, walks with a cane Patient leans: N/A  Psychiatric Specialty Exam: Physical Exam  Review of Systems  Musculoskeletal: Positive for myalgias and joint pain.  Psychiatric/Behavioral: Positive  for depression. Negative for suicidal ideas. The patient has insomnia. The patient is not nervous/anxious.   All other systems reviewed and are negative.   Blood pressure 145/82, pulse 107, temperature 97.9 F (36.6 C), temperature source Oral, resp. rate 18, height 5\' 7"  (1.702 m), weight 147.873 kg (326 lb).Body mass index is 51.05 kg/(m^2).  General Appearance: Casual  Eye Contact::  Fair  Speech:  Normal Rate  Volume:  Decreased  Mood:  Anxious and Depressed  Affect:  reactive  Thought Process:  Goal Directed  Orientation:  Full (Time, Place, and Person)  Thought Content:  Rumination  Suicidal Thoughts:  No  Homicidal Thoughts:  No  Memory:  Immediate;   Fair Recent;   Fair Remote;   Fair  Judgement:   Fair  Insight:  Fair  Psychomotor Activity:  Decreased  Concentration:  Fair  Recall:  [employer] of Knowledge:Fair  Language: Fair  Akathisia:  No  Handed:  Right  AIMS (if indicated):     Assets:  Desire for Improvement  ADL's:  Intact  Cognition: WNL  Sleep:  Number of Hours: 6.75   Current Medications: Current Facility-Administered Medications  Medication Dose Route Frequency Provider Last Rate Last Dose  . acetaminophen (TYLENOL) tablet 650 mg  650 mg Oral Q6H PRN Kizzie Fantasia, NP   650 mg at 09/01/14 0825  . alum & mag hydroxide-simeth (MAALOX/MYLANTA) 200-200-20 MG/5ML suspension 30 mL  30 mL Oral Q4H PRN Kizzie Fantasia, NP      . DULoxetine (CYMBALTA) DR capsule 30 mg  30 mg Oral Daily Craige Cotta, MD   30 mg at 09/01/14 1610  . feeding supplement (ENSURE ENLIVE) (ENSURE ENLIVE) liquid 237 mL  237 mL Oral BID BM Tilda Franco, RD   237 mL at 09/01/14 1331  . ferrous sulfate tablet 325 mg  325 mg Oral Q breakfast Craige Cotta, MD   325 mg at 09/01/14 9604  . hydrOXYzine (ATARAX/VISTARIL) tablet 25 mg  25 mg Oral Q8H PRN Kizzie Fantasia, NP   25 mg at 08/31/14 2138  . lidocaine (LIDODERM) 5 % 2 patch  2 patch Transdermal Q24H Shuvon B Rankin, NP   2 patch at 08/31/14 2139  . magnesium hydroxide (MILK OF MAGNESIA) suspension 30 mL  30 mL Oral Daily PRN Evanna Burkett, NP      . mirtazapine (REMERON) tablet 15 mg  15 mg Oral QHS Jomarie Longs, MD   15 mg at 08/31/14 2137  . Oxcarbazepine (TRILEPTAL) tablet 600 mg  600 mg Oral BID Craige Cotta, MD   600 mg at 09/01/14 5409  . pregabalin (LYRICA) capsule 25 mg  25 mg Oral BID Jomarie Longs, MD   25 mg at 09/01/14 1329  . traZODone (DESYREL) tablet 150 mg  150 mg Oral QHS Jomarie Longs, MD       Lab Results:  No results found for this or any previous visit (from the past 48 hour(s)). Physical Findings:  AIMS: Facial and Oral Movements Muscles of Facial Expression: None, normal Lips and Perioral Area: None,  normal Jaw: None, normal Tongue: None, normal,Extremity Movements Upper (arms, wrists, hands, fingers): None, normal Lower (legs, knees, ankles, toes): None, normal, Trunk Movements Neck, shoulders, hips: None, normal, Overall Severity Severity of abnormal movements (highest score from questions above): None, normal Incapacitation due to abnormal movements: None, normal Patient's awareness of abnormal movements (rate only patient's report): No Awareness, Dental Status Current problems with teeth and/or dentures?: No Does patient usually  wear dentures?: No  CIWA:    COWS:     Assessment:  Pt with hx of Bipolar disorder, PTSD , multiple medical problems as well as morbid obesity . Pt with continued sleep issues. Will readjust medications.  Treatment Plan Summary: Daily contact with patient to assess and evaluate symptoms and progress in treatment and Medication management Will continue Trileptal 600 mg po bid for mood symptoms. Continue Remeron 15 mg po qhs for insomnia. Continue Cymbalta 30 mg po daily for affetcive sx. Increase Trazodone to 150 mg po qhs for sleep. Discussed sleep hygiene techniques as described above.  Will continue to monitor vitals ,medication compliance and treatment side effects while patient is here.  Will monitor for medical issues as well as call consult as needed.    Dietican consult for obesity. Hospitalist will continue to follow patient - see consult note .  CSW will start working on disposition.  Patient to participate in therapeutic milieu .   Medical Decision Making:  Review of Psycho-Social Stressors (1), Review of Last Therapy Session (1), Review of Medication Regimen & Side Effects (2) and Review of New Medication or Change in Dosage (2)  Finnlee Guarnieri, MD 09/01/2014, 2:38 PM

## 2014-09-01 NOTE — [facility] Group Notes (Signed)
   [facility] LCSW Aftercare Discharge Planning Group Note  09/01/2014  8:45 AM   Participation Quality: Alert, Appropriate and Oriented  Mood/Affect: Appropriate  Depression Rating: 2  Anxiety Rating: 2  Thoughts of Suicide: Pt denies SI/HI  Will you contract for safety? Yes  Current AVH: Pt denies  Plan for Discharge/Comments: Pt attended discharge planning group and actively participated in group. CSW provided pt with today's workbook. Patient reports feeling "okay" today and states feeling ready to discharge home to follow up with outpatient services.  Transportation Means: Pt reports access to transportation- requests transportation back to [facility] ED  Supports: No supports mentioned at this time  Samuella Bruin, MSW, [employer] Clinical Social Worker [employer] [phone]

## 2014-09-01 NOTE — Progress Notes (Signed)
D. Pt had been up and visible in milieu this evening, did attend and participate in evening group activity. Pt spoke of his day and spoke of how his day has been good and how he has been interacting with other patients in milieu. Pt then shared about how he had issues with his mother before she passed and spoke of a moment when his older brother had committed suicide that a comment was made by his mother that she wished it was him Fayrene Fearing) instead. Pt reports that has bothered him since that comment was made. Pt also spoke of his pain and continued ways to help with his pain management other than medications. Pt did receive all medications without incident this evening. A. Support and encouragement provided. R. Safety maintained, will continue to monitor.

## 2014-09-01 NOTE — Progress Notes (Signed)
Recreation Therapy Notes  Date: 08.29.2016 Time: 9:30am Location: 300 Hall Group Room   Group Topic: Stress Management  Goal Area(s) Addresses:  Patient will actively participate in stress management techniques presented during session.   Behavioral Response: Did not attend.   Marykay Lex Kaymarie Wynn, LRT/CTRS        Ibrahem Volkman L 09/01/2014 1:53 PM

## 2014-09-01 NOTE — Progress Notes (Signed)
D:Pt rates depression as a 2 and anxiety as a 3 on 0-10 scale with 10 being the most. He reports si thoughts as sometimes . Pt has been in the dayroom interacting with staff and peers. He c/o chronic pain all over with fibromyalgia. A:Offered support and 15 minute checks. R:Safety maintained on the unit.

## 2014-09-01 NOTE — Plan of Care (Signed)
Problem: Food- and Nutrition-Related Knowledge Deficit (NB-1.1) Goal: Nutrition education Formal process to instruct or train a patient/client in a skill or to impart knowledge to help patients/clients voluntarily manage or modify food choices and eating behavior to maintain or improve health. Outcome: Completed/Met Date Met:  09/01/14  RD consulted for nutrition education regarding morbid obesity. Pt reports hx of gastric bypass surgery 10 years ago.  Body mass index is 51.05 kg/(m^2). Pt meets criteria for morbid obesity based on current BMI.  RD discussed importance of controlled and consistent intake throughout the day. Reviewed the importance of eating adequate amounts of protein. Emphasized the importance of hydration with calorie-free beverages and limiting sugar-sweetened beverages. Encouraged pt to discuss physical activity options with physician. Teach back method used.  Expect good compliance if patient is in good financial standing. Pt reports not being able to afford protein shakes and follow a high protein diet. Pt continues to lose weight. RD to order Ensure Enlive po BID, each supplement provides 350 kcal and 20 grams of protein to provide additional protein to diet.   Diet Order: Diet regular Room service appropriate?: No; Fluid consistency:: Thin Pt is also offered choice of unit snacks mid-morning and mid-afternoon.  Pt is eating as desired.   Labs and medications reviewed. No further nutrition interventions warranted at this time. If additional nutrition issues arise, please re-consult RD.  Clayton Bibles, MS, RD, LDN Pager: [phone] After Hours Pager: [phone]

## 2014-09-01 NOTE — Progress Notes (Signed)
D: Patient in his room on approach.  Patient states he had a good day.  Patient states he did have a verbal conflict with another patient today but states he was able to get through it.  Patident appears to be blaming others for his problems. Patient visible on the unit and interacting with peers.  Patient denies SI/HI and denies AVH. A: Staff to monitor Q 15 mins for safety.  Encouragement and support offered.  Scheduled medications administered per orders. R: Patient remains safe on the unit.  Patient attended group tonight.  Patient visible on the unit and interacting with peers.  Patient taking administered medications.

## 2014-09-02 LAB — 10-HYDROXYCARBAZEPINE: TRILIPTAL/MTB(OXCARBAZEPIN): 12 ug/mL (ref 10–35)

## 2014-09-02 MED ORDER — FERROUS SULFATE 325 (65 FE) MG PO TABS: 325.0000 mg | ORAL_TABLET | Freq: Every day | ORAL | Status: DC

## 2014-09-02 MED ORDER — LISINOPRIL 10 MG PO TABS: 10.0000 mg | ORAL_TABLET | Freq: Every day | ORAL | Status: DC

## 2014-09-02 MED ORDER — OXCARBAZEPINE 600 MG PO TABS: 600.0000 mg | ORAL_TABLET | Freq: Two times a day (BID) | ORAL | Status: DC

## 2014-09-02 MED ORDER — PREGABALIN 25 MG PO CAPS: 25.0000 mg | ORAL_CAPSULE | Freq: Two times a day (BID) | ORAL | Status: DC

## 2014-09-02 MED ORDER — MIRTAZAPINE 15 MG PO TABS: 15.0000 mg | ORAL_TABLET | Freq: Every day | ORAL | Status: DC

## 2014-09-02 MED ORDER — DULOXETINE HCL 30 MG PO CPEP: 30.0000 mg | ORAL_CAPSULE | Freq: Every day | ORAL | Status: DC

## 2014-09-02 MED ORDER — TRAZODONE HCL 150 MG PO TABS: 150.0000 mg | ORAL_TABLET | Freq: Every day | ORAL | Status: DC

## 2014-09-02 NOTE — [facility] Suicide Risk Assessment (Signed)
[facility] Discharge Suicide Risk Assessment   Demographic Factors:  Male  Total Time spent with patient: 30 minutes  Musculoskeletal: Strength & Muscle Tone: within normal limits Gait & Station: normal Patient leans: N/A  Psychiatric Specialty Exam: Physical Exam  Review of Systems  Psychiatric/Behavioral: Negative for depression, suicidal ideas and hallucinations. The patient is not nervous/anxious and does not have insomnia.   All other systems reviewed and are negative.   Blood pressure 143/89, pulse 89, temperature 98.6 F (37 C), temperature source Oral, resp. rate 18, height  (1.702 m), weight 147.873 kg (326 lb).Body mass index is 51.05 kg/(m^2).  General Appearance: Casual  Eye Contact::  Fair  Speech:  Clear and Coherent409  Volume:  Normal  Mood:  Euthymic  Affect:  Appropriate  Thought Process:  Coherent  Orientation:  Full (Time, Place, and Person)  Thought Content:  WDL  Suicidal Thoughts:  No  Homicidal Thoughts:  No  Memory:  Immediate;   Fair Recent;   Fair Remote;   Fair  Judgement:  Fair  Insight:  Fair  Psychomotor Activity:  Normal  Concentration:  Fair  Recall:  [employer] of Knowledge:Fair  Language: Fair  Akathisia:  No  Handed:  Right  AIMS (if indicated):     Assets:  Communication Skills Desire for Improvement  Sleep:  Number of Hours: 6.5  Cognition: WNL  ADL's:  Intact   Have you used any form of tobacco in the last 30 days? (Cigarettes, Smokeless Tobacco, Cigars, and/or Pipes): No  Has this patient used any form of tobacco in the last 30 days? (Cigarettes, Smokeless Tobacco, Cigars, and/or Pipes) No  Mental Status Per Nursing Assessment::   On Admission:     Current Mental Status by Physician: pt denies SI/HI/AH/VH  Loss Factors: NA  Historical Factors: Impulsivity  Risk Reduction Factors:   Positive social support  Continued Clinical Symptoms:  Previous Psychiatric Diagnoses and Treatments Medical Diagnoses and  Treatments/Surgeries  Cognitive Features That Contribute To Risk:  Polarized thinking    Suicide Risk:  Minimal: No identifiable suicidal ideation.  Patients presenting with no risk factors but with morbid ruminations; may be classified as minimal risk based on the severity of the depressive symptoms  Principal Problem: Bipolar disorder, current episode depressed, severe, without psychotic features Discharge Diagnoses:  Patient Active Problem List   Diagnosis Date Noted  . Bipolar disorder, current episode depressed, severe, without psychotic features [F31.4] 08/29/2014  . Morbid obesity [E66.01] 08/29/2014  . Hypocalcemia [E83.51]   . AKI (acute kidney injury) [N17.9]   . Benign essential HTN [I10]   . Anxiety state, unspecified [F41.1] 01/01/2013  . Posttraumatic stress disorder [F43.10] 01/01/2013    Follow-up Information    Follow up with [facility].   Specialty:  Professional Counselor   Why:  Please walk-in between 8am-3pm Monday-Friday to be set up with services.   Contact information:   [facility] [place] [address] [place] [address] [phone]       Plan Of Care/Follow-up recommendations:  Activity:  No restrictions Diet:  low calorie, carb modified, heart healthy Tests:  as needed Other:  follow up with after care  Is patient on multiple antipsychotic therapies at discharge:  No   Has Patient had three or more failed trials of antipsychotic monotherapy by history:  No  Recommended Plan for Multiple Antipsychotic Therapies: NA    Myrian Botello md  09/02/2014, 10:08 AM

## 2014-09-02 NOTE — Progress Notes (Signed)
  [facility] Adult Case Management Discharge Plan :  Will you be returning to the same living situation after discharge:  Yes,  patient plans to return home At discharge, do you have transportation home?: Yes,  patient reports access to transportation Do you have the ability to pay for your medications: Yes,  patient will be provided with prescriptions at discharge  Release of information consent forms completed and in the chart;  Patient's signature needed at discharge.  Patient to Follow up at: Follow-up Information    Follow up with [facility].   Specialty:  Professional Counselor   Why:  Please walk-in between 8am-3pm Monday-Friday to be set up with services.   Contact information:   [facility] [place] [address] [place] [address] [phone]       Patient denies SI/HI: Yes,  denies    Safety Planning and Suicide Prevention discussed: Yes,  with patient  Have you used any form of tobacco in the last 30 days? (Cigarettes, Smokeless Tobacco, Cigars, and/or Pipes): No  Has patient been referred to the Quitline?: N/A patient is not a smoker  Zachary [name] L 09/02/2014, 10:15 AM

## 2014-09-02 NOTE — Progress Notes (Signed)
Pt discharged home, pt was transported by the security to [facility] where pt had to pick up his to drive himself home. Pt was ambulatory and well at the time of discharge, All papers and prescriptions were given and valuables returned. [place]  understanding expressed. Denies SI/HI and A/VH. Pt given opportunity to express concerns and asked questions.

## 2014-09-02 NOTE — Tx Team (Addendum)
Interdisciplinary Treatment Plan Update (Adult) Date: 09/02/2014   Date: 09/02/2014 9:42 AM  Progress in Treatment:  Attending groups: Yes  Participating in groups: Yes  Taking medication as prescribed: Yes  Tolerating medication: Yes  Family/Significant othe contact made: No, CSW assessing for appropriate contacts Patient understands diagnosis: Yes Discussing patient identified problems/goals with staff: Yes  Medical problems stabilized or resolved: Yes  Denies suicidal/homicidal ideation: No, endorses passive SI Patient has not harmed self or Others: Yes   New problem(s) identified: None identified at this time.   Discharge Plan or Barriers: CSW will assess for appropriate discharge plan and relevant barriers.   Additional comments: n/a   Reason for Continuation of Hospitalization:  Depression Medication stabilization Suicidal ideation  Estimated length of stay: Discharge anticipated for today 09/02/14  Review of initial/current patient goals per problem list:   1.  Goal(s): Patient will participate in aftercare plan  Met:  Yes  Target date: 3-5 days from date of admission   As evidenced by: Patient will participate within aftercare plan AEB aftercare provider and housing plan at discharge being identified.  08/28/14: CSW to work with Pt to assess for appropriate discharge plan and faciliate appointments and referrals as needed prior to d/c. 8/30: Goal met: Patient plans to return home to follow up with outpatient services.  2.  Goal (s): Patient will exhibit decreased depressive symptoms and suicidal ideations.  Met:  Yes  Target date: 3-5 days from date of admission   As evidenced by: Patient will utilize self rating of depression at 3 or below and demonstrate decreased signs of depression or be deemed stable for discharge by MD. 08/28/14: Pt was admitted with symptoms of depression, rating 10/10. Pt continues to present with flat affect and depressive symptoms.  Pt  will demonstrate decreased symptoms of depression and rate depression at 3/10 or lower prior to discharge. Pt endorsing passive SI. 8/29: Goal met: Patient rates depression at 2 today and denies SI.   Attendees: Patient:    Family:    Physician: Dr. Shea Evans; Dr. Sabra Heck 09/02/2014 9:30 AM  Nursing: Gaylan Gerold, Olene Floss, Timoteo Gaul RN 09/02/2014 9:30 AM  Clinical Social Worker: Tilden Fossa, [place] 09/02/2014 9:30 AM  Other: Maxie Better, LCSWA 09/02/2014 9:30 AM  Other:  09/02/2014 9:30 AM  Other: Lars Pinks, Case Manager 09/02/2014 9:30 AM  Other: Ave Filter, NP 09/02/2014 9:30 AM  Other:    Other:           Norman Clay MSW
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
## 2016-11-04 ENCOUNTER — Ambulatory Visit (HOSPITAL_COMMUNITY)
Admission: RE | Admit: 2016-11-04 | Discharge: 2016-11-04 | Disposition: A | Discharge status: Home / Self Care | Payer: Federal, State, Local not specified - Other | Attending: Psychiatry | Admitting: Psychiatry

## 2016-11-04 ENCOUNTER — Inpatient Hospital Stay (HOSPITAL_COMMUNITY)
Admission: EM | Admit: 2016-11-04 | Discharge: 2016-11-11 | DRG: 885 | Disposition: A | Discharge status: Other Acute Inpatient Hospital | Payer: Self-pay | Attending: Family Medicine | Admitting: Family Medicine

## 2016-11-04 ENCOUNTER — Encounter (HOSPITAL_COMMUNITY): Payer: Self-pay | Admitting: Emergency Medicine

## 2016-11-04 DIAGNOSIS — F4310 Post-traumatic stress disorder, unspecified: Secondary | ICD-10-CM | POA: Diagnosis present

## 2016-11-04 DIAGNOSIS — Z888 Allergy status to other drugs, medicaments and biological substances status: Secondary | ICD-10-CM

## 2016-11-04 DIAGNOSIS — Z1330 Encounter for screening examination for mental health and behavioral disorders, unspecified: Secondary | ICD-10-CM | POA: Insufficient documentation

## 2016-11-04 DIAGNOSIS — S60811A Abrasion of right wrist, initial encounter: Secondary | ICD-10-CM | POA: Diagnosis present

## 2016-11-04 DIAGNOSIS — M797 Fibromyalgia: Secondary | ICD-10-CM | POA: Diagnosis present

## 2016-11-04 DIAGNOSIS — G479 Sleep disorder, unspecified: Secondary | ICD-10-CM | POA: Diagnosis present

## 2016-11-04 DIAGNOSIS — F419 Anxiety disorder, unspecified: Secondary | ICD-10-CM | POA: Insufficient documentation

## 2016-11-04 DIAGNOSIS — S60812A Abrasion of left wrist, initial encounter: Secondary | ICD-10-CM | POA: Diagnosis present

## 2016-11-04 DIAGNOSIS — R45851 Suicidal ideations: Secondary | ICD-10-CM

## 2016-11-04 DIAGNOSIS — M1990 Unspecified osteoarthritis, unspecified site: Secondary | ICD-10-CM | POA: Diagnosis present

## 2016-11-04 DIAGNOSIS — Z9884 Bariatric surgery status: Secondary | ICD-10-CM

## 2016-11-04 DIAGNOSIS — M25569 Pain in unspecified knee: Secondary | ICD-10-CM | POA: Diagnosis present

## 2016-11-04 DIAGNOSIS — X788XXA Intentional self-harm by other sharp object, initial encounter: Secondary | ICD-10-CM | POA: Diagnosis present

## 2016-11-04 DIAGNOSIS — D649 Anemia, unspecified: Secondary | ICD-10-CM | POA: Diagnosis present

## 2016-11-04 DIAGNOSIS — D509 Iron deficiency anemia, unspecified: Secondary | ICD-10-CM | POA: Diagnosis present

## 2016-11-04 DIAGNOSIS — F314 Bipolar disorder, current episode depressed, severe, without psychotic features: Principal | ICD-10-CM | POA: Diagnosis present

## 2016-11-04 DIAGNOSIS — Z6841 Body Mass Index (BMI) 40.0 and over, adult: Secondary | ICD-10-CM

## 2016-11-04 DIAGNOSIS — G8929 Other chronic pain: Secondary | ICD-10-CM | POA: Diagnosis present

## 2016-11-04 DIAGNOSIS — F333 Major depressive disorder, recurrent, severe with psychotic symptoms: Secondary | ICD-10-CM | POA: Insufficient documentation

## 2016-11-04 DIAGNOSIS — R5383 Other fatigue: Secondary | ICD-10-CM | POA: Diagnosis present

## 2016-11-04 DIAGNOSIS — F0631 Mood disorder due to known physiological condition with depressive features: Secondary | ICD-10-CM | POA: Diagnosis present

## 2016-11-04 DIAGNOSIS — Z885 Allergy status to narcotic agent status: Secondary | ICD-10-CM

## 2016-11-04 DIAGNOSIS — D638 Anemia in other chronic diseases classified elsewhere: Secondary | ICD-10-CM | POA: Diagnosis present

## 2016-11-04 DIAGNOSIS — D61818 Other pancytopenia: Secondary | ICD-10-CM | POA: Diagnosis present

## 2016-11-04 DIAGNOSIS — I10 Essential (primary) hypertension: Secondary | ICD-10-CM | POA: Diagnosis present

## 2016-11-04 DIAGNOSIS — E669 Obesity, unspecified: Secondary | ICD-10-CM | POA: Insufficient documentation

## 2016-11-04 DIAGNOSIS — Z79899 Other long term (current) drug therapy: Secondary | ICD-10-CM

## 2016-11-04 DIAGNOSIS — Z91040 Latex allergy status: Secondary | ICD-10-CM

## 2016-11-04 DIAGNOSIS — E610 Copper deficiency: Secondary | ICD-10-CM | POA: Diagnosis present

## 2016-11-04 DIAGNOSIS — T07XXXA Unspecified multiple injuries, initial encounter: Secondary | ICD-10-CM

## 2016-11-04 LAB — CBC
HEMATOCRIT: 27.3 % — AB (ref 39.0–52.0)
HEMOGLOBIN: 7.2 g/dL — AB (ref 13.0–17.0)
MCH: 17.5 pg — ABNORMAL LOW (ref 26.0–34.0)
MCHC: 26.4 g/dL — ABNORMAL LOW (ref 30.0–36.0)
MCV: 66.4 fL — AB (ref 78.0–100.0)
Platelets: 123 10*3/uL — ABNORMAL LOW (ref 150–400)
RBC: 4.11 MIL/uL — AB (ref 4.22–5.81)
RDW: 20.1 % — ABNORMAL HIGH (ref 11.5–15.5)
WBC: 4.1 10*3/uL (ref 4.0–10.5)

## 2016-11-04 LAB — COMPREHENSIVE METABOLIC PANEL
ALK PHOS: 176 U/L — AB (ref 38–126)
ALT: 14 U/L — AB (ref 17–63)
ANION GAP: 6 (ref 5–15)
AST: 18 U/L (ref 15–41)
Albumin: 3.3 g/dL — ABNORMAL LOW (ref 3.5–5.0)
BILIRUBIN TOTAL: 0.9 mg/dL (ref 0.3–1.2)
BUN: 13 mg/dL (ref 6–20)
CALCIUM: 8 mg/dL — AB (ref 8.9–10.3)
CO2: 23 mmol/L (ref 22–32)
CREATININE: 1.01 mg/dL (ref 0.61–1.24)
Chloride: 110 mmol/L (ref 101–111)
Glucose, Bld: 94 mg/dL (ref 65–99)
Potassium: 3.9 mmol/L (ref 3.5–5.1)
Sodium: 139 mmol/L (ref 135–145)
TOTAL PROTEIN: 6.6 g/dL (ref 6.5–8.1)

## 2016-11-04 LAB — RAPID URINE DRUG SCREEN, HOSP PERFORMED
AMPHETAMINES: NOT DETECTED
BARBITURATES: NOT DETECTED
BENZODIAZEPINES: NOT DETECTED
COCAINE: NOT DETECTED
Opiates: NOT DETECTED
Tetrahydrocannabinol: NOT DETECTED

## 2016-11-04 LAB — ACETAMINOPHEN LEVEL

## 2016-11-04 LAB — ETHANOL: Alcohol, Ethyl (B): <10 mg/dL (ref <10)

## 2016-11-04 LAB — SALICYLATE LEVEL

## 2016-11-04 NOTE — ED Notes (Signed)
Bed: WTR6 Expected date:  Expected time:  Means of arrival:  Comments: 
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
## 2016-11-04 NOTE — [facility] Counselor (Signed)
Per Nira ConnJason Berry, NP: Patient meets inpatient criteria.  Patient transferred to [facility] for medical clearance.  Misty StanleyLisa, Consulting civil engineerCharge RN notified at 2008.

## 2016-11-04 NOTE — H&P (Signed)
Behavioral Health Medical Screening Exam  Zachary [name] is an 46 y.o. male.  Total Time spent with patient: 15 minutes  Psychiatric Specialty Exam: Physical Exam  Constitutional: He is oriented to person, place, and time. He appears well-developed. No distress.  HENT:  Head: Normocephalic and atraumatic.  Right Ear: External ear normal.  Left Ear: External ear normal.  Eyes: Conjunctivae are normal. Right eye exhibits no discharge. Left eye exhibits no discharge. No scleral icterus.  Cardiovascular: Normal rate, regular rhythm and normal heart sounds.   Respiratory: Effort normal and breath sounds normal. No respiratory distress.  Neurological: He is alert and oriented to person, place, and time.  Skin: Skin is warm and dry. He is not diaphoretic.     Psychiatric: His speech is normal. His mood appears anxious. His affect is not blunt, not labile and not inappropriate. He is not withdrawn and not actively hallucinating. Thought content is not paranoid and not delusional. Cognition and memory are normal. He expresses impulsivity and inappropriate judgment. He exhibits a depressed mood. He expresses suicidal ideation. He expresses no homicidal ideation. He expresses suicidal plans. He is attentive.    Review of Systems  Musculoskeletal: Positive for joint pain.  Psychiatric/Behavioral: Positive for depression, hallucinations and suicidal ideas. Negative for memory loss and substance abuse. The patient is nervous/anxious and has insomnia.   All other systems reviewed and are negative.   Blood pressure (!) 149/92, pulse 85, temperature 98.6 F (37 C), resp. rate 16, SpO2 100 %.There is no height or weight on file to calculate BMI.  General Appearance: Casual and Fairly Groomed  Eye Contact:  Good  Speech:  Clear and Coherent and Normal Rate  Volume:  Normal  Mood:  Anxious, Depressed, Dysphoric, Hopeless, Irritable and Worthless  Affect:  Congruent and Depressed  Thought Process:   Coherent and Goal Directed  Orientation:  Full (Time, Place, and Person)  Thought Content:  Logical and Hallucinations: Auditory Visual  Suicidal Thoughts:  Yes.  with intent/plan  Homicidal Thoughts:  No  Memory:  Immediate;   Good Recent;   Good Remote;   Good  Judgement:  Impaired  Insight:  Fair  Psychomotor Activity:  Normal  Concentration: Concentration: Good and Attention Span: Good  Recall:  Good  Fund of Knowledge:Good  Language: Good  Akathisia:  No  Handed:  Right  AIMS (if indicated):     Assets:  Communication Skills Desire for Improvement Financial Resources/Insurance Housing Intimacy Leisure Time  Sleep:       Musculoskeletal: Strength & Muscle Tone: within normal limits Gait & Station: unsteady   Blood pressure (!) 149/92, pulse 85, temperature 98.6 F (37 C), resp. rate 16, SpO2 100 %.  Recommendations:  Based on my evaluation the patient does not appear to have an emergency medical condition.  Jackelyn PolingJason A Ciin Brazzel, NP 11/04/2016, 11:02 PM

## 2016-11-04 NOTE — ED Triage Notes (Signed)
Patient brought over by Mental Health tech stating they have no beds available. Patient reports having thoughts of harming himself. Patient has superficial lacerations to wrist from 5 today, states his roommate had to stop him. Pt reports having many plans to harm himself. Pt uses cane to get around due to arthritis. .[name]

## 2016-11-04 NOTE — ED Provider Notes (Signed)
[facility]-EMERGENCY DEPT Provider Note   CSN: [id number] Arrival date & time: 11/04/16  2053     History   Chief Complaint Chief Complaint  Patient presents with  . Suicidal    HPI Zachary [name] is a 46 y.o. male.  The history is provided by the patient and medical records. No language interpreter was used.   Zachary [name] is a 46 y.o. male  with a PMH of fibromyalgia, arthritis, bipolar disorder, PTSD who presents to the Emergency Department complaining of suicidal thoughts.  He started cutting himself to bilateral wrists with a razor earlier today when his roommate walked in and stopped him.  He states that he wanted to cut deeper, but it was hurting too badly.  He states this was an attempt to kill himself.  Patient states that he has arthritis in multiple joints and fibromyalgia.  He is in constant pain and tired of living like this.  He endorses history of suicidal thoughts in the past and 2 prior attempts.  Denies homicidal ideations.  Patient states that he has also had difficulty sleeping for the last 3 days.  He denies any auditory hallucinations, but states that he did start seeing shadows today which she believes is because of his lack of sleep.  He states that he is on medication for his bipolar disorder and anxiety, however not sure what this medication is called.  He notes taking it, but not regularly.  Tetanus up-to-date.  Past Medical History:  Diagnosis Date  . Anxiety   . Arthritis   . Bipolar 1 disorder, depressed (HCC)   . Fibromyalgia   . Hx of degenerative disc disease   . Obesity   . PTSD (post-traumatic stress disorder)     Patient Active Problem List   Diagnosis Date Noted  . Bipolar disorder, current episode depressed, severe, without psychotic features (HCC) 08/29/2014  . Morbid obesity (HCC) 08/29/2014  . Hypocalcemia   . AKI (acute kidney injury) (HCC)   . Benign essential HTN   . Anxiety state, unspecified 01/01/2013    . Posttraumatic stress disorder 01/01/2013    Past Surgical History:  Procedure Laterality Date  . ADENOIDECTOMY    . APPENDECTOMY    . CHOLECYSTECTOMY    . GASTRIC BYPASS    . TYMPANOSTOMY TUBE PLACEMENT         Home Medications    Prior to Admission medications   Medication Sig Start Date End Date Taking? Authorizing Provider  methocarbamol (ROBAXIN) 750 MG tablet Take 750 mg by mouth 3 (three) times daily.    Yes [provider]  naproxen sodium (ALEVE) 220 MG tablet Take 880 mg by mouth daily as needed (pain).    Yes [provider]  traZODone (DESYREL) 150 MG tablet Take 1 tablet (150 mg total) by mouth at bedtime. Patient taking differently: Take 100 mg by mouth at bedtime.  09/02/14  Yes Thermon Leyland, NP  DULoxetine (CYMBALTA) 30 MG capsule Take 1 capsule (30 mg total) by mouth daily. Patient not taking: Reported on 11/04/2016 09/02/14   Thermon Leyland, NP  ferrous sulfate 325 (65 FE) MG tablet Take 1 tablet (325 mg total) by mouth daily with breakfast. Patient not taking: Reported on 11/04/2016 09/02/14   Thermon Leyland, NP  lisinopril (PRINIVIL,ZESTRIL) 10 MG tablet Take 1 tablet (10 mg total) by mouth daily. Patient not taking: Reported on 11/04/2016 09/02/14   Thermon Leyland, NP  mirtazapine (REMERON) 15 MG  tablet Take 1 tablet (15 mg total) by mouth at bedtime. 09/02/14   Thermon Leyland, NP  Oxcarbazepine (TRILEPTAL) 600 MG tablet Take 1 tablet (600 mg total) by mouth 2 (two) times daily. Patient not taking: Reported on 11/04/2016 09/02/14   Thermon Leyland, NP  pregabalin (LYRICA) 25 MG capsule Take 1 capsule (25 mg total) by mouth 2 (two) times daily. Patient not taking: Reported on 11/04/2016 09/02/14   Thermon Leyland, NP    Family History Family History  Problem Relation Age of Onset  . Diabetes Father   . Cancer Father     Social History Social History  Substance Use Topics  . Smoking status: Never Smoker  . Smokeless tobacco: Never Used  .  Alcohol use No     Comment: none     Allergies   Capsaicin; Codeine; and Latex   Review of Systems Review of Systems  Skin: Positive for wound.  Psychiatric/Behavioral: Positive for self-injury, sleep disturbance and suicidal ideas.  All other systems reviewed and are negative.    Physical Exam Updated Vital Signs BP (!) 142/97 (BP Location: Left Arm)   Pulse 80   Temp 98 F (36.7 C) (Oral)   Resp 18   SpO2 100%   Physical Exam  Constitutional: He is oriented to person, place, and time. He appears well-developed and well-nourished. No distress.  HENT:  Head: Normocephalic and atraumatic.  Neck: Neck supple.  Cardiovascular: Normal rate, regular rhythm and normal heart sounds.   No murmur heard. Pulmonary/Chest: Effort normal and breath sounds normal. No respiratory distress.  Abdominal: Soft. He exhibits no distension. There is no tenderness.  Neurological: He is alert and oriented to person, place, and time.  Skin: Skin is warm and dry.  Superficial abrasions to bilateral wrists.  Nursing note and vitals reviewed.    ED Treatments / Results  Labs (all labs ordered are listed, but only abnormal results are displayed) Labs Reviewed  COMPREHENSIVE METABOLIC PANEL - Abnormal; Notable for the following:       Result Value   Calcium 8.0 (*)    Albumin 3.3 (*)    ALT 14 (*)    Alkaline Phosphatase 176 (*)    All other components within normal limits  ACETAMINOPHEN LEVEL - Abnormal; Notable for the following:    Acetaminophen (Tylenol), Serum <10 (*)    All other components within normal limits  CBC - Abnormal; Notable for the following:    RBC 4.11 (*)    Hemoglobin 7.2 (*)    HCT 27.3 (*)    MCV 66.4 (*)    MCH 17.5 (*)    MCHC 26.4 (*)    RDW 20.1 (*)    Platelets 123 (*)    All other components within normal limits  ETHANOL  SALICYLATE LEVEL  RAPID URINE DRUG SCREEN, HOSP PERFORMED  VITAMIN B12  FOLATE  IRON AND TIBC  FERRITIN  RETICULOCYTES  CBC  WITH DIFFERENTIAL/PLATELET    EKG  EKG Interpretation None       Radiology No results found.  Procedures Procedures (including critical care time)  Medications Ordered in ED Medications  ferrous sulfate tablet 325 mg (not administered)     Initial Impression / Assessment and Plan / ED Course  I have reviewed the triage vital signs and the nursing notes.  Pertinent labs & imaging results that were available during my care of the patient were reviewed by me and considered in my medical decision making (see chart  for details).    Zachary DodrillJames E [name] is a 46 y.o. male who presents to ED for suicidal thoughts. He attempted to cut his wrists today, but roommate stopped him. He does have superficial abrasions to bilateral wrists. No laceration requiring repair.  Tetanus is up-to-date. Medically screening labs reveal hemoglobin of 7.2.  Patient has history of anemia with hemoglobin as low as 8.4 in the past.  MCV and MCH low, RDW high. Likely iron deficiency anemia. Anemia panel obtained. Will start on iron supplementation TID. TTS has evaluated patient and recommend inpatient criteria. They will be seeking placement. Given hgb of 7.2. Will observe in ED overnight. Check CBC in am. If hgb stable, medically cleared. Plan of care discussed with oncoming attending, Dr. Silverio LayYao, at shift change.    Final Clinical Impressions(s) / ED Diagnoses   Final diagnoses:  Suicidal thoughts  Multiple abrasions    New Prescriptions New Prescriptions   No medications on file     Ward, Chase PicketJaime Pilcher, PA-C 11/05/16 0113    Shaune PollackIsaacs, Cameron, MD 11/06/16 [phone]

## 2016-11-05 ENCOUNTER — Encounter (HOSPITAL_COMMUNITY): Payer: Self-pay

## 2016-11-05 DIAGNOSIS — Z811 Family history of alcohol abuse and dependence: Secondary | ICD-10-CM

## 2016-11-05 DIAGNOSIS — T07XXXA Unspecified multiple injuries, initial encounter: Secondary | ICD-10-CM

## 2016-11-05 DIAGNOSIS — F314 Bipolar disorder, current episode depressed, severe, without psychotic features: Principal | ICD-10-CM

## 2016-11-05 DIAGNOSIS — M1990 Unspecified osteoarthritis, unspecified site: Secondary | ICD-10-CM

## 2016-11-05 DIAGNOSIS — Z818 Family history of other mental and behavioral disorders: Secondary | ICD-10-CM

## 2016-11-05 DIAGNOSIS — R45851 Suicidal ideations: Secondary | ICD-10-CM

## 2016-11-05 DIAGNOSIS — G4700 Insomnia, unspecified: Secondary | ICD-10-CM

## 2016-11-05 DIAGNOSIS — M2550 Pain in unspecified joint: Secondary | ICD-10-CM

## 2016-11-05 DIAGNOSIS — D649 Anemia, unspecified: Secondary | ICD-10-CM | POA: Diagnosis present

## 2016-11-05 DIAGNOSIS — D509 Iron deficiency anemia, unspecified: Secondary | ICD-10-CM

## 2016-11-05 LAB — CBC WITH DIFFERENTIAL/PLATELET
BASOS ABS: 0 10*3/uL (ref 0.0–0.1)
Basophils Relative: 0 %
EOS ABS: 0 10*3/uL (ref 0.0–0.7)
Eosinophils Relative: 1 %
HCT: 23.1 % — ABNORMAL LOW (ref 39.0–52.0)
HEMOGLOBIN: 6.3 g/dL — AB (ref 13.0–17.0)
LYMPHS PCT: 32 %
Lymphs Abs: 1.2 10*3/uL (ref 0.7–4.0)
MCH: 17.9 pg — AB (ref 26.0–34.0)
MCHC: 27.3 g/dL — AB (ref 30.0–36.0)
MCV: 65.8 fL — ABNORMAL LOW (ref 78.0–100.0)
MONO ABS: 0.2 10*3/uL (ref 0.1–1.0)
Monocytes Relative: 6 %
NEUTROS ABS: 2.3 10*3/uL (ref 1.7–7.7)
Neutrophils Relative %: 61 %
PLATELETS: 88 10*3/uL — AB (ref 150–400)
RBC: 3.51 MIL/uL — ABNORMAL LOW (ref 4.22–5.81)
RDW: 20.4 % — AB (ref 11.5–15.5)
WBC: 3.7 10*3/uL — AB (ref 4.0–10.5)

## 2016-11-05 LAB — IRON AND TIBC
IRON: 19 ug/dL — AB (ref 45–182)
Saturation Ratios: 5 % — ABNORMAL LOW (ref 17.9–39.5)
TIBC: 403 ug/dL (ref 250–450)
UIBC: 384 ug/dL

## 2016-11-05 LAB — RETICULOCYTES
RBC.: 4.07 MIL/uL — ABNORMAL LOW (ref 4.22–5.81)
RETIC COUNT ABSOLUTE: 44.8 10*3/uL (ref 19.0–186.0)
Retic Ct Pct: 1.1 % (ref 0.4–3.1)

## 2016-11-05 LAB — VITAMIN B12: VITAMIN B 12: 393 pg/mL (ref 180–914)

## 2016-11-05 LAB — ABO/RH: ABO/RH(D): B POS

## 2016-11-05 LAB — PREPARE RBC (CROSSMATCH)

## 2016-11-05 LAB — FOLATE: FOLATE: 16.6 ng/mL (ref >5.9)

## 2016-11-05 LAB — FERRITIN: Ferritin: 2 ng/mL — ABNORMAL LOW (ref 24–336)

## 2016-11-05 MED ORDER — PRENATAL MULTIVITAMIN CH
1.0000 | ORAL_TABLET | Freq: Every day | ORAL | Status: DC
  Administered 2016-11-05 – 2016-11-11 (×7): 1 via ORAL
  Filled 2016-11-05 (×7): qty 1

## 2016-11-05 MED ORDER — ZOLPIDEM TARTRATE 5 MG PO TABS: 5.0000 mg | ORAL_TABLET | Freq: Every evening | ORAL | Status: DC | PRN

## 2016-11-05 MED ORDER — SODIUM CHLORIDE 0.9 % IV SOLN: Freq: Once | INTRAVENOUS | Status: AC

## 2016-11-05 MED ORDER — ACETAMINOPHEN 325 MG PO TABS
650.0000 mg | ORAL_TABLET | ORAL | Status: DC | PRN
Start: 2016-11-05 — End: 2016-11-11

## 2016-11-05 MED ORDER — NAPROXEN 250 MG PO TABS
500.0000 mg | ORAL_TABLET | Freq: Two times a day (BID) | ORAL | Status: DC | PRN
  Filled 2016-11-05: qty 2

## 2016-11-05 MED ORDER — TRAZODONE HCL 50 MG PO TABS
150.0000 mg | ORAL_TABLET | Freq: Every day | ORAL | Status: DC
Start: 2016-11-05 — End: 2016-11-05

## 2016-11-05 MED ORDER — DIPHENHYDRAMINE HCL 50 MG/ML IJ SOLN
12.5000 mg | Freq: Three times a day (TID) | INTRAMUSCULAR | Status: DC | PRN
  Administered 2016-11-05: 12.5 mg via INTRAVENOUS
  Filled 2016-11-05: qty 1

## 2016-11-05 MED ORDER — FERROUS SULFATE 325 (65 FE) MG PO TABS
325.0000 mg | ORAL_TABLET | Freq: Three times a day (TID) | ORAL | Status: DC
  Administered 2016-11-05: 325 mg via ORAL
  Filled 2016-11-05 (×3): qty 1

## 2016-11-05 MED ORDER — METHOCARBAMOL 500 MG PO TABS: 750.0000 mg | ORAL_TABLET | Freq: Three times a day (TID) | ORAL | Status: DC

## 2016-11-05 MED ORDER — SENNOSIDES-DOCUSATE SODIUM 8.6-50 MG PO TABS
1.0000 | ORAL_TABLET | Freq: Two times a day (BID) | ORAL | Status: DC
  Administered 2016-11-07 – 2016-11-10 (×2): 1 via ORAL
  Filled 2016-11-05 (×7): qty 1

## 2016-11-05 MED ORDER — TRAZODONE HCL 50 MG PO TABS
150.0000 mg | ORAL_TABLET | Freq: Every evening | ORAL | Status: DC | PRN
  Administered 2016-11-07: 21:00:00 150 mg via ORAL
  Filled 2016-11-05: qty 1

## 2016-11-05 MED ORDER — CALCIUM CARBONATE ANTACID 500 MG PO CHEW
2.0000 | CHEWABLE_TABLET | ORAL | Status: DC | PRN
  Administered 2016-11-07 – 2016-11-10 (×3): 400 mg via ORAL
  Filled 2016-11-05 (×5): qty 2

## 2016-11-05 MED ORDER — MIRTAZAPINE 15 MG PO TABS
15.0000 mg | ORAL_TABLET | Freq: Every day | ORAL | Status: DC
  Administered 2016-11-05 – 2016-11-10 (×5): 15 mg via ORAL
  Filled 2016-11-05 (×6): qty 1

## 2016-11-05 MED ORDER — METHOCARBAMOL 500 MG PO TABS: 750.0000 mg | ORAL_TABLET | Freq: Three times a day (TID) | ORAL | Status: DC | PRN

## 2016-11-05 MED ORDER — ENSURE ENLIVE PO LIQD
237.0000 mL | Freq: Two times a day (BID) | ORAL | Status: DC
  Administered 2016-11-05 – 2016-11-06 (×2): 237 mL via ORAL

## 2016-11-05 MED ORDER — DOCUSATE SODIUM 100 MG PO CAPS
100.0000 mg | ORAL_CAPSULE | Freq: Every day | ORAL | Status: DC
  Administered 2016-11-07 – 2016-11-10 (×2): 100 mg via ORAL
  Filled 2016-11-05 (×5): qty 1

## 2016-11-05 MED ORDER — GABAPENTIN 100 MG PO CAPS
100.0000 mg | ORAL_CAPSULE | Freq: Three times a day (TID) | ORAL | Status: DC
  Administered 2016-11-05 – 2016-11-11 (×13): 100 mg via ORAL
  Filled 2016-11-05 (×16): qty 1

## 2016-11-05 NOTE — [facility] Counselor (Signed)
Per Nira ConnJason Berry, NP: Patient meets inpatient criteria.  Patient transferred to [facility] for medical clearance.  Misty StanleyLisa, Consulting civil engineerCharge RN notified at 2008.  [facility] RailJoann Glover, RN: no appropriate beds available.  TTS to seek placement.   [facility], Elizabeth SauerJaime Ward, PA-C, notified at [phone].

## 2016-11-05 NOTE — ED Provider Notes (Signed)
  Physical Exam  BP (!) 146/80 (BP Location: Left Arm)   Pulse 63   Temp 98 F (36.7 C) (Oral)   Resp 16   Ht 5\' 9"  (1.753 m)   Wt 131.5 kg (290 lb)   SpO2 100%   BMI 42.83 kg/m   Physical Exam  ED Course  Procedures  MDM Care assumed from previous team. Patient has suicidal ideation. CBC showed Hg 7.2, no active bleeding. Previous was several years ago. Was thought to have anemia of chronic disease. Anemia panel sent and still pending. Started on iron pills. Will get repeat CBC in AM, if stable, medically cleared. Psych recommend admission when medically cleared.   7:35 AM Anemia panel and repeat CBC pending. Signed out to Dr. Madilyn Hookees in the ED. Anticipate psych admission when medically cleared.      Charlynne PanderYao, Jenicka Coxe Hsienta, MD 11/05/16 [phone]

## 2016-11-05 NOTE — ED Notes (Signed)
Bed: [id number] Expected date:  Expected time:  Means of arrival:  Comments: Hold for Tr 6

## 2016-11-05 NOTE — ED Provider Notes (Signed)
Pt visit shared.  Patient here with suicidal thoughts and worsening depression.  He has a history of gastric bypass and takes occasional naproxen.  Hemoglobin decreased compared to yesterday down to 6.3 today.  He declines rectal examination in the ED.  He denies any black or bloody stools.  Question if his worsening depression is exacerbated by his profound anemia, after discussions with patients will transfuse and hospitalist consulted for admission for anemia.   Tilden Fossaees, Evens Meno, MD 11/05/16 [phone]

## 2016-11-05 NOTE — Consult Note (Addendum)
[facility] Face-to-Face Psychiatry Consult   Reason for Consult:  Suicide risk assessment  Referring Physician:  EDP Patient Identification: Zachary [name] MRN:  [id number] Principal Diagnosis: Bipolar disorder, current episode depressed, severe, without psychotic features [facility])   Diagnosis:   Patient Active Problem List   Diagnosis Date Noted  . Anemia [D64.9] 11/05/2016  . Bipolar disorder, current episode depressed, severe, without psychotic features ([place]) [F31.4] 08/29/2014  . Morbid obesity ([place]) [E66.01] 08/29/2014  . Hypocalcemia [E83.51]   . AKI (acute kidney injury) ([place]) [N17.9]   . Benign essential HTN [I10]   . Anxiety state, unspecified [F41.1] 01/01/2013  . Posttraumatic stress disorder [F43.10] 01/01/2013    Total Time spent with patient: 1 hour  Subjective:   Zachary [name] is a 46 y.o. male patient admitted with microcytic anemia (hemoglobin dropped by 7.2 to 6.3 on admission). Psychiatry was consulted for depression and SI.   HPI:  Zachary [name] reports depressed mood and SI for the past 2 months. He reports worsening pain for the past 2 months secondary to arthritis. He not see anyone for his pain because he does not have insurance. He reports poor sleep due to pain. He has nightmares that also wake him up. He was previously taking Trazodone 150 mg qhs but discontinued it 2 months ago because it was no longer helpful for sleep. He also discontinued his other psychotropic medications. He denies access to weapons. He additionally reports poor appetite and concentration. He has 20-30 pounds over the past 4 months. He no longer enjoys doing crafts and poetry. He reports feelings of hopelessness. He cut his bilateral wrists yesterday in order to kill himself. He showed this notewriter the lacerations. He reports that he has been suicidal since 46 y/o. The last time he cut himself was 2 months ago but he did not seek help. He denies current AVH. He reports having egodystonic thoughts  when his mood is depressed. He also reports hallucinating when he has poor sleep. He will see shadows. He reports a history of manic symptoms for 3-4 days. He reports this happening twice in the past month. Patient has no clear history documented in medical records of manic symptoms and it appears that his mood lability is most likely related to a personality structure with poor stress tolerance and unipolar depression.    Past Psychiatric History: Bipolar disorder, panic attacks and several suicide attempts since 46 y/o.   Risk to Self: Is patient at risk for suicide?: Yes Risk to Others:  No Prior Inpatient Therapy:  He was admitted to [facility] x 3 (08/2014, 07/2013 and 01/2013). Prior Outpatient Therapy:  He receives his care at [facility]. His counselor, Merry Proud is also at [facility].   Past Medical History:  Past Medical History:  Diagnosis Date  . Anxiety   . Arthritis   . Bipolar 1 disorder, depressed ([place])   . Fibromyalgia   . Hx of degenerative disc disease   . Obesity   . PTSD (post-traumatic stress disorder)     Past Surgical History:  Procedure Laterality Date  . ADENOIDECTOMY    . APPENDECTOMY    . CHOLECYSTECTOMY    . GASTRIC BYPASS    . TYMPANOSTOMY TUBE PLACEMENT     Family History:  Family History  Problem Relation Age of Onset  . Diabetes Father   . Cancer Father    Family Psychiatric  History: Sister-"psychosis" but not formally diagnosed with mental health condition; brother-alcoholism and depression and committed suicide several  years ago.   Social History:  History  Alcohol Use No    Comment: none     History  Drug Use No    Social History   Social History  . Marital status: Single    Spouse name: N/A  . Number of children: N/A  . Years of education: N/A   Social History Main Topics  . Smoking status: Never Smoker  . Smokeless tobacco: Never Used  . Alcohol use No     Comment: none  . Drug use: No  . Sexual activity: Yes    Birth  control/ protection: Condom   Other Topics Concern  . None   Social History Narrative  . None   Additional Social History:He lives with his girlfriend and roommate. They have been together for 8 years. She is supportive. He is divorced and does not have children. He has not worked for several years. He was denied disability. He receives money from a family inheritance. His cousin manages the money. He denies a history of alcohol, illicit substance or tobacco use.     Allergies:   Allergies  Allergen Reactions  . Capsaicin Other (See Comments)    Burning on skin - causes blisters immediately   . Codeine Itching  . Latex     Labs:  Results for orders placed or performed during the hospital encounter of 11/04/16 (from the past 48 hour(s))  Rapid urine drug screen (hospital performed)     Status: None   Collection Time: 11/04/16 10:42 PM  Result Value Ref Range   Opiates NONE DETECTED NONE DETECTED   Cocaine NONE DETECTED NONE DETECTED   Benzodiazepines NONE DETECTED NONE DETECTED   Amphetamines NONE DETECTED NONE DETECTED   Tetrahydrocannabinol NONE DETECTED NONE DETECTED   Barbiturates NONE DETECTED NONE DETECTED    Comment:        DRUG SCREEN FOR MEDICAL PURPOSES ONLY.  IF CONFIRMATION IS NEEDED FOR ANY PURPOSE, NOTIFY LAB WITHIN 5 DAYS.        LOWEST DETECTABLE LIMITS FOR URINE DRUG SCREEN Drug Class       Cutoff (ng/mL) Amphetamine      1000 Barbiturate      200 Benzodiazepine   468 Tricyclics       032 Opiates          300 Cocaine          300 THC              50   Comprehensive metabolic panel     Status: Abnormal   Collection Time: 11/04/16 10:47 PM  Result Value Ref Range   Sodium 139 135 - 145 mmol/L   Potassium 3.9 3.5 - 5.1 mmol/L   Chloride 110 101 - 111 mmol/L   CO2 23 22 - 32 mmol/L   Glucose, Bld 94 65 - 99 mg/dL   BUN 13 6 - 20 mg/dL   Creatinine, Ser 1.01 0.61 - 1.24 mg/dL   Calcium 8.0 (L) 8.9 - 10.3 mg/dL   Total Protein 6.6 6.5 - 8.1 g/dL    Albumin 3.3 (L) 3.5 - 5.0 g/dL   AST 18 15 - 41 U/L   ALT 14 (L) 17 - 63 U/L   Alkaline Phosphatase 176 (H) 38 - 126 U/L   Total Bilirubin 0.9 0.3 - 1.2 mg/dL   GFR calc non Af Amer >60 >60 mL/min   GFR calc Af Amer >60 >60 mL/min    Comment: (NOTE) The eGFR has been calculated  using the CKD EPI equation. This calculation has not been validated in all clinical situations. eGFR's persistently <60 mL/min signify possible Chronic Kidney Disease.    Anion gap 6 5 - 15  Ethanol     Status: None   Collection Time: 11/04/16 10:47 PM  Result Value Ref Range   Alcohol, Ethyl (B) <10 <10 mg/dL    Comment:        LOWEST DETECTABLE LIMIT FOR SERUM ALCOHOL IS 10 mg/dL FOR MEDICAL PURPOSES ONLY   Salicylate level     Status: None   Collection Time: 11/04/16 10:47 PM  Result Value Ref Range   Salicylate Lvl <1.6 2.8 - 30.0 mg/dL  Acetaminophen level     Status: Abnormal   Collection Time: 11/04/16 10:47 PM  Result Value Ref Range   Acetaminophen (Tylenol), Serum <10 (L) 10 - 30 ug/mL    Comment:        THERAPEUTIC CONCENTRATIONS VARY SIGNIFICANTLY. A RANGE OF 10-30 ug/mL MAY BE AN EFFECTIVE CONCENTRATION FOR MANY PATIENTS. HOWEVER, SOME ARE BEST TREATED AT CONCENTRATIONS OUTSIDE THIS RANGE. ACETAMINOPHEN CONCENTRATIONS >150 ug/mL AT 4 HOURS AFTER INGESTION AND >50 ug/mL AT 12 HOURS AFTER INGESTION ARE OFTEN ASSOCIATED WITH TOXIC REACTIONS.   cbc     Status: Abnormal   Collection Time: 11/04/16 10:47 PM  Result Value Ref Range   WBC 4.1 4.0 - 10.5 K/uL   RBC 4.11 (L) 4.22 - 5.81 MIL/uL   Hemoglobin 7.2 (L) 13.0 - 17.0 g/dL   HCT 27.3 (L) 39.0 - 52.0 %   MCV 66.4 (L) 78.0 - 100.0 fL   MCH 17.5 (L) 26.0 - 34.0 pg   MCHC 26.4 (L) 30.0 - 36.0 g/dL   RDW 20.1 (H) 11.5 - 15.5 %   Platelets 123 (L) 150 - 400 K/uL  Reticulocytes     Status: Abnormal   Collection Time: 11/05/16 12:01 AM  Result Value Ref Range   Retic Ct Pct 1.1 0.4 - 3.1 %   RBC. 4.07 (L) 4.22 - 5.81 MIL/uL    Retic Count, Absolute 44.8 19.0 - 186.0 K/uL  Vitamin B12     Status: None   Collection Time: 11/05/16  3:20 AM  Result Value Ref Range   Vitamin B-12 393 180 - 914 pg/mL    Comment: (NOTE) This assay is not validated for testing neonatal or myeloproliferative syndrome specimens for Vitamin B12 levels. Performed at [facility], [place] [address] [place], [place] [address]   Iron and TIBC     Status: Abnormal   Collection Time: 11/05/16  3:20 AM  Result Value Ref Range   Iron 19 (L) 45 - 182 ug/dL   TIBC 403 250 - 450 ug/dL   Saturation Ratios 5 (L) 17.9 - 39.5 %   UIBC 384 ug/dL    Comment: Performed at [facility], [place] [address], [place], [place] [address]  Ferritin     Status: Abnormal   Collection Time: 11/05/16  3:20 AM  Result Value Ref Range   Ferritin 2 (L) 24 - 336 ng/mL    Comment: Performed at [facility], [place] [address] [place], [place] [address]  Folate     Status: None   Collection Time: 11/05/16  3:24 AM  Result Value Ref Range   Folate 16.6 >5.9 ng/mL    Comment: Performed at [facility] [address] [place], [place] [address]  CBC with Differential/Platelet     Status: Abnormal  Collection Time: 11/05/16  6:50 AM  Result Value Ref Range   WBC 3.7 (L) 4.0 - 10.5 K/uL   RBC 3.51 (L) 4.22 - 5.81 MIL/uL   Hemoglobin 6.3 (LL) 13.0 - 17.0 g/dL    Comment: RESULT REPEATED AND VERIFIED CRITICAL RESULT CALLED TO, READ BACK BY AND VERIFIED WITH: DOWD,P AT 0750 ON [id number] BY HOOKER,B    HCT 23.1 (L) 39.0 - 52.0 %   MCV 65.8 (L) 78.0 - 100.0 fL   MCH 17.9 (L) 26.0 - 34.0 pg   MCHC 27.3 (L) 30.0 - 36.0 g/dL   RDW 20.4 (H) 11.5 - 15.5 %   Platelets 88 (L) 150 - 400 K/uL    Comment: RESULT REPEATED AND VERIFIED SPECIMEN CHECKED FOR CLOTS PLATELET COUNT CONFIRMED BY SMEAR    Neutrophils Relative % 61 %   Lymphocytes Relative 32 %   Monocytes Relative 6 %   Eosinophils Relative 1 %   Basophils Relative 0 %   Neutro Abs 2.3 1.7 - 7.7  K/uL   Lymphs Abs 1.2 0.7 - 4.0 K/uL   Monocytes Absolute 0.2 0.1 - 1.0 K/uL   Eosinophils Absolute 0.0 0.0 - 0.7 K/uL   Basophils Absolute 0.0 0.0 - 0.1 K/uL   Smear Review MORPHOLOGY UNREMARKABLE   Prepare RBC     Status: None   Collection Time: 11/05/16  9:31 AM  Result Value Ref Range   Order Confirmation ORDER PROCESSED BY BLOOD BANK   Type and screen [place]     Status: None   Collection Time: 11/05/16  9:31 AM  Result Value Ref Range   ABO/RH(D) B POS    Antibody Screen NEG    Sample Expiration 11/08/2016     Current Facility-Administered Medications  Medication Dose Route Frequency Provider Last Rate Last Dose  . 0.9 %  sodium chloride infusion   Intravenous Once Quintella Reichert, MD      . acetaminophen (TYLENOL) tablet 650 mg  650 mg Oral Q4H PRN Florencia Reasons, MD      . calcium carbonate (TUMS - dosed in mg elemental calcium) chewable tablet 400 mg of elemental calcium  2 tablet Oral Q4H PRN Florencia Reasons, MD      . docusate sodium (COLACE) capsule 100 mg  100 mg Oral Daily Florencia Reasons, MD      . ferrous sulfate tablet 325 mg  325 mg Oral TID Ward, Ozella Almond, PA-C   325 mg at 11/05/16 0118  . methocarbamol (ROBAXIN) tablet 750 mg  750 mg Oral TID Ward, Ozella Almond, PA-C      . mirtazapine (REMERON) tablet 15 mg  15 mg Oral QHS Ward, Ozella Almond, PA-C      . naproxen (NAPROSYN) tablet 500 mg  500 mg Oral BID PRN Ward, Ozella Almond, PA-C      . prenatal multivitamin tablet 1 tablet  1 tablet Oral Q1200 Florencia Reasons, MD      . traZODone (DESYREL) tablet 150 mg  150 mg Oral QHS Ward, Ozella Almond, PA-C      . zolpidem (AMBIEN) tablet 5 mg  5 mg Oral QHS PRN Florencia Reasons, MD       Current Outpatient Prescriptions  Medication Sig Dispense Refill  . methocarbamol (ROBAXIN) 750 MG tablet Take 750 mg by mouth 3 (three) times daily.     . naproxen sodium (ALEVE) 220 MG tablet Take 880 mg by mouth daily as needed (pain).     . traZODone (DESYREL) 150  MG tablet Take 1  tablet (150 mg total) by mouth at bedtime. (Patient taking differently: Take 100 mg by mouth at bedtime. ) 30 tablet 0  . DULoxetine (CYMBALTA) 30 MG capsule Take 1 capsule (30 mg total) by mouth daily. (Patient not taking: Reported on 11/04/2016) 30 capsule 0  . ferrous sulfate 325 (65 FE) MG tablet Take 1 tablet (325 mg total) by mouth daily with breakfast. (Patient not taking: Reported on 11/04/2016)  3  . lisinopril (PRINIVIL,ZESTRIL) 10 MG tablet Take 1 tablet (10 mg total) by mouth daily. (Patient not taking: Reported on 11/04/2016) 30 tablet 0  . mirtazapine (REMERON) 15 MG tablet Take 1 tablet (15 mg total) by mouth at bedtime. 30 tablet 0  . Oxcarbazepine (TRILEPTAL) 600 MG tablet Take 1 tablet (600 mg total) by mouth 2 (two) times daily. (Patient not taking: Reported on 11/04/2016) 60 tablet 0  . pregabalin (LYRICA) 25 MG capsule Take 1 capsule (25 mg total) by mouth 2 (two) times daily. (Patient not taking: Reported on 11/04/2016) 60 capsule 0    Musculoskeletal: Strength & Muscle Tone: within normal limits Gait & Station:  He uses a a wheelchair and canes to ambulate.   Patient leans: N/A  Psychiatric Specialty Exam: Physical Exam  Nursing note and vitals reviewed. Constitutional: He is oriented to person, place, and time. He appears well-developed and well-nourished.  HENT:  Head: Normocephalic and atraumatic.  Neck: Normal range of motion.  Respiratory: Effort normal.  Musculoskeletal: Normal range of motion.  Neurological: He is alert and oriented to person, place, and time.  Skin: No rash noted.  Multiple superficial lacerations noted on bilateral wrists.  Psychiatric: His behavior is normal.    Review of Systems  Constitutional: Negative for chills and fever.  Gastrointestinal: Negative for diarrhea, nausea and vomiting.  Musculoskeletal: Positive for joint pain.  Psychiatric/Behavioral: Positive for depression and suicidal ideas. Negative for hallucinations and substance  abuse. The patient has insomnia. The patient is not nervous/anxious.     Blood pressure 126/63, pulse 84, temperature 98 F (36.7 C), temperature source Oral, resp. rate 16, height 5' 9"  (1.753 m), weight 131.5 kg (290 lb), SpO2 100 %.Body mass index is 42.83 kg/m.  General Appearance: Well Groomed, overweight, Caucasian male in a hospital gown. NAD.   Eye Contact:  Good  Speech:  Clear and Coherent  Volume:  Normal  Mood:  Depressed  Affect:  Congruent and Depressed  Thought Process:  Goal Directed and Linear  Orientation:  Full (Time, Place, and Person)  Thought Content:  Logical  Suicidal Thoughts:  Yes.  with intent/plan  Homicidal Thoughts:  No  Memory:  Immediate;   Good Recent;   Good Remote;   Good  Judgement:  Poor  Insight:  Good  Psychomotor Activity:  Normal  Concentration:  Concentration: Good and Attention Span: Good  Recall:  Good  Fund of Knowledge:  Good  Language:  Good  Akathisia:  No  Handed:  Right  AIMS (if indicated):   N/A  Assets:  Agricultural consultant Housing Social Support  ADL's:  Intact  Cognition:  WNL  Sleep:   N/A   Assessment:  Zachary [name] is a 46 y/o male who was  admitted with microcytic anemia (hemoglobin dropped by 7.2 to 6.3 on admission). Psychiatry was consulted for depression and SI. He reports depression and worsening SI for the past 2 months in the setting of discontinuing his medications. He also has severe anemia on admission  that could be contributing to worsening mood symptoms. He warrants inpatient psychiatric admission following medical clearance given high risk of harm to self and ongoing SI.   Treatment Plan Summary: Depression with suicide attempt:  -Patient warrants inpatient psychiatric hospitalization once medically cleared given suicide attempt and high risk of harm to self. Please IVC patient if he refuses voluntary admission or tries to leave hospital. -Continue bedside  sitter. -Recommend restarting Remeron 15 mg qhs for depression and sleep. Will defer management of other psychotropic medications (Cymbalta, Trileptal and Trazodone) for inpatient psychiatric management.    Disposition: Recommend psychiatric Inpatient admission when medically cleared.  Faythe Dingwall, DO 11/05/2016 11:26 AM

## 2016-11-05 NOTE — H&P (Addendum)
History and Physical  Zachary [name]:[id number] DOB: 20-Nov-1970 DOA: 11/04/2016  Referring physician: EDP PCP: Dartha Lodge, FNP   Chief Complaint:  Suicidal ideation, anemia  HPI: Zachary [name] is a 46 y.o. male   H/o obesity Body mass index is 42.83 kg/m. s/p gastric bypass surgery 21yr ago. (He does not have primary care doctor, he does not take iron/folate/b12/vitamin supplement), history of microcytic anemia, h/o kidney stone, history of bipolar disease, history of fibromyalgia and degenerative disc disease, he self-referred to behavioral health hospital due to suicidal ideation. he is referred to ED due to no bed available.    ED course: vital signs are stable. Initial CBC hemoglobin 7.2, repeat CBC 6.3, no overt bleeding. He reports fatigue, denies sob, no chest pain, he reports chronic dizziness ,walks with two canes and use wheelchair at baseline.  patient declined rectal exam, he denies of abdominal pain, denies of blood in the stool or urine.  Hospitalist called to admit the patient.   Review of Systems:  Detail per HPI, Review of systems are otherwise negative  Past Medical History:  Diagnosis Date  . Anxiety   . Arthritis   . Bipolar 1 disorder, depressed ([facility])   . Fibromyalgia   . Hx of degenerative disc disease   . Obesity   . PTSD (post-traumatic stress disorder)    Past Surgical History:  Procedure Laterality Date  . ADENOIDECTOMY    . APPENDECTOMY    . CHOLECYSTECTOMY    . GASTRIC BYPASS    . TYMPANOSTOMY TUBE PLACEMENT     Social History:  reports that he has never smoked. He has never used smokeless tobacco. He reports that he does not drink alcohol or use drugs. Patient lives at home with girlfriend and a roommate & walks with two canes and use wheelchair at baseline.  Allergies  Allergen Reactions  . Capsaicin Other (See Comments)    Burning on skin - causes blisters immediately   . Codeine Itching  . Latex     Family History    Problem Relation Age of Onset  . Diabetes Father   . Cancer Father       Prior to Admission medications   Medication Sig Start Date End Date Taking? Authorizing Provider  methocarbamol (ROBAXIN) 750 MG tablet Take 750 mg by mouth 3 (three) times daily.    Yes [provider]  naproxen sodium (ALEVE) 220 MG tablet Take 880 mg by mouth daily as needed (pain).    Yes [provider]  traZODone (DESYREL) 150 MG tablet Take 1 tablet (150 mg total) by mouth at bedtime. Patient taking differently: Take 100 mg by mouth at bedtime.  09/02/14  Yes Thermon Leyland, NP  DULoxetine (CYMBALTA) 30 MG capsule Take 1 capsule (30 mg total) by mouth daily. Patient not taking: Reported on 11/04/2016 09/02/14   Thermon Leyland, NP  ferrous sulfate 325 (65 FE) MG tablet Take 1 tablet (325 mg total) by mouth daily with breakfast. Patient not taking: Reported on 11/04/2016 09/02/14   Thermon Leyland, NP  lisinopril (PRINIVIL,ZESTRIL) 10 MG tablet Take 1 tablet (10 mg total) by mouth daily. Patient not taking: Reported on 11/04/2016 09/02/14   Thermon Leyland, NP  mirtazapine (REMERON) 15 MG tablet Take 1 tablet (15 mg total) by mouth at bedtime. 09/02/14   Thermon Leyland, NP  Oxcarbazepine (TRILEPTAL) 600 MG tablet Take 1 tablet (600 mg total) by mouth 2 (two) times daily. Patient not taking: Reported  on 11/04/2016 09/02/14   Thermon Leyland, NP  pregabalin (LYRICA) 25 MG capsule Take 1 capsule (25 mg total) by mouth 2 (two) times daily. Patient not taking: Reported on 11/04/2016 09/02/14   Thermon Leyland, NP    Physical Exam: BP (!) 146/70   Pulse 75   Temp 98 F (36.7 C) (Oral)   Resp 15   Ht 5\' 9"  (1.753 m)   Wt 131.5 kg (290 lb)   SpO2 98%   BMI 42.83 kg/m   General:  Obese, NAD, depressed  Eyes: PERRL ENT: unremarkable Neck: supple, no JVD Cardiovascular: RRR Respiratory: CTABL Abdomen: soft/ND/ND, positive bowel sounds Skin: no rash Musculoskeletal:  No edema, superficial abrasion  bilateral wrists.  Psychiatric: flat affect, calm/cooperative Neurologic: no focal findings            Labs on Admission:  Basic Metabolic Panel:  Recent Labs Lab 11/04/16 2247  NA 139  K 3.9  CL 110  CO2 23  GLUCOSE 94  BUN 13  CREATININE 1.01  CALCIUM 8.0*   Liver Function Tests:  Recent Labs Lab 11/04/16 2247  AST 18  ALT 14*  ALKPHOS 176*  BILITOT 0.9  PROT 6.6  ALBUMIN 3.3*   No results for input(s): LIPASE, AMYLASE in the last 168 hours. No results for input(s): AMMONIA in the last 168 hours. CBC:  Recent Labs Lab 11/04/16 2247 11/05/16 0650  WBC 4.1 3.7*  NEUTROABS  --  PENDING  HGB 7.2* 6.3*  HCT 27.3* 23.1*  MCV 66.4* 65.8*  PLT 123* PENDING   Cardiac Enzymes: No results for input(s): CKTOTAL, CKMB, CKMBINDEX, TROPONINI in the last 168 hours.  BNP (last 3 results) No results for input(s): BNP in the last 8760 hours.  ProBNP (last 3 results) No results for input(s): PROBNP in the last 8760 hours.  CBG: No results for input(s): GLUCAP in the last 168 hours.  Radiological Exams on Admission: No results found.    Assessment/Plan Present on Admission: **None**    Anemia, microcytic, likely contribute to fatigue and depression -anemia likely multifactorial, including nutrition deficiency in the setting of gastric bypass surgery. he also reported take naproxen chronically but denies of abdominal pain, denies blood in his stool.  Could also have component of anemia of chronic disease as  reticulocyte count is inappropriately low  -Anemia work up, UA/stool FOBT, declined rectal exam -S/p prbc x1unit  Questionable pancytopenia, will repeat CBC.  I discussed this with patient. he will need to establish care with her primary care physician. may need hematology referral   Depression/SI, psych consulted, suicidal sitter  Morbid obesity: (report weigh 600pounds prior to bypass surgery) Body mass index is 42.83 kg/m.     DVT prophylaxis:  scd's  Consultants: psychiatry  Code Status: full   Family Communication:  Patient   Disposition Plan: med tele obs  Time spent:  Nikelle Malatesta MD, PhD [facility] Pager [phone] If 7PM-7AM, please contact night-coverage at [email or web address], password [facility]

## 2016-11-06 ENCOUNTER — Other Ambulatory Visit: Payer: Self-pay

## 2016-11-06 DIAGNOSIS — I10 Essential (primary) hypertension: Secondary | ICD-10-CM

## 2016-11-06 DIAGNOSIS — R45851 Suicidal ideations: Secondary | ICD-10-CM

## 2016-11-06 LAB — CBC WITH DIFFERENTIAL/PLATELET
BASOS ABS: 0 10*3/uL (ref 0.0–0.1)
BASOS PCT: 0 %
EOS ABS: 0 10*3/uL (ref 0.0–0.7)
EOS PCT: 1 %
HEMATOCRIT: 25.6 % — AB (ref 39.0–52.0)
HEMOGLOBIN: 7 g/dL — AB (ref 13.0–17.0)
LYMPHS PCT: 32 %
Lymphs Abs: 1.1 10*3/uL (ref 0.7–4.0)
MCH: 18.4 pg — ABNORMAL LOW (ref 26.0–34.0)
MCHC: 27.3 g/dL — AB (ref 30.0–36.0)
MCV: 67.4 fL — ABNORMAL LOW (ref 78.0–100.0)
MONOS PCT: 1 %
Monocytes Absolute: 0 10*3/uL — ABNORMAL LOW (ref 0.1–1.0)
NEUTROS ABS: 2.2 10*3/uL (ref 1.7–7.7)
Neutrophils Relative %: 66 %
Platelets: 108 10*3/uL — ABNORMAL LOW (ref 150–400)
RBC: 3.8 MIL/uL — AB (ref 4.22–5.81)
RDW: 21.7 % — AB (ref 11.5–15.5)
WBC: 3.3 10*3/uL — ABNORMAL LOW (ref 4.0–10.5)

## 2016-11-06 LAB — PROTIME-INR
INR: 1.32
PROTHROMBIN TIME: 16.3 s — AB (ref 11.4–15.2)

## 2016-11-06 LAB — BASIC METABOLIC PANEL
ANION GAP: 5 (ref 5–15)
BUN: 16 mg/dL (ref 6–20)
CALCIUM: 7.9 mg/dL — AB (ref 8.9–10.3)
CHLORIDE: 114 mmol/L — AB (ref 101–111)
CO2: 23 mmol/L (ref 22–32)
CREATININE: 0.96 mg/dL (ref 0.61–1.24)
GFR calc non Af Amer: >60 mL/min (ref >60)
Glucose, Bld: 89 mg/dL (ref 65–99)
Potassium: 4 mmol/L (ref 3.5–5.1)
SODIUM: 142 mmol/L (ref 135–145)

## 2016-11-06 LAB — PREPARE RBC (CROSSMATCH)

## 2016-11-06 LAB — TSH: TSH: 2.036 u[IU]/mL (ref 0.350–4.500)

## 2016-11-06 LAB — MAGNESIUM: MAGNESIUM: 1.9 mg/dL (ref 1.7–2.4)

## 2016-11-06 MED ORDER — SODIUM CHLORIDE 0.9 % IV SOLN
Freq: Once | INTRAVENOUS | Status: AC
  Administered 2016-11-06: 09:00:00 via INTRAVENOUS

## 2016-11-06 MED ORDER — SODIUM CHLORIDE 0.9 % IV SOLN
510.0000 mg | Freq: Once | INTRAVENOUS | Status: AC
  Administered 2016-11-06: 510 mg via INTRAVENOUS
  Filled 2016-11-06: qty 17

## 2016-11-06 MED ORDER — CYANOCOBALAMIN 1000 MCG/ML IJ SOLN
1000.0000 ug | Freq: Once | INTRAMUSCULAR | Status: AC
  Administered 2016-11-07: 1000 ug via INTRAMUSCULAR
  Filled 2016-11-06: qty 1
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
## 2016-11-06 NOTE — Progress Notes (Addendum)
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  Assessment & Plan:     Suicidal ideation -Continue sitter at bedside, psych consult requested -Suicide precautions  Severe Microcytic anemia/pancytopenia -Anemia panel notable for severe iron deficiency -status post 1 unit PRBC yesterday hemoglobin is 7.0 this morning we will give 1 more unit of packed red blood cells -His previous hemoglobin had been ranging from 8-9 range 2 years ago suspect primarily nutritional deficiencies due to gastric bypass and noncompliance with vitamins, minerals and iron supplementation for 10+years -no history of blood in stools or black stools no hematemesis reported either, will check Hemoccult stool for completion -called and d/w Heme Dr.Ennever due to concomitant leukopenia and thrombocytopenia, recommended Iv Iron, Supplemental B12 and check Copper level, he also agreed this is most likely progression of nutritional deficiencies following gastric bypass    Posttraumatic stress disorder/bipolar disorder, depression -chronic pain and fibromyalgia -Continue Robaxin when necessary, trazodone -continue  Remeron    Morbid obesity (HCC) -not an active issue    Benign essential HTN -stable, no longer taking lisinopril  DVT prophylaxis:SCDs Code Status: full code Family Communication:no family at bedside Disposition Plan: behavioral Health pending medical stabilization  Consultants:   D/w  Dr.Ennever   Procedures:   Antimicrobials:    Subjective: -continues to have suicidal thoughts and ideations, denies any bleeding or blood loss overtly  Objective: Vitals:   11/06/16 0616 11/06/16 0829 11/06/16 0900 11/06/16 1106  BP: 113/67 137/73 139/78 136/70  Pulse: 70 67 69 60  Resp: 16 14 16 14   Temp: 97.6 F (36.4 C) 98.4 F (36.9 C) 98.3 F (36.8 C) 98.4 F (36.9 C)  TempSrc: Oral Oral Oral Oral  SpO2: 99% 98% 100% 100%  Weight:      Height:        Intake/Output Summary (Last 24 hours) at 11/06/2016 1224 Last data filed at 11/06/2016 1106 Gross per 24 hour  Intake 1656.17 ml  Output -  Net 1656.17 ml   Filed Weights   11/05/16 0727  Weight: 131.5 kg (290 lb)    Examination:  General exam:obese male, sitting in bed, no distress, alert awake oriented 3  Respiratory system: Clear to auscultation. Respiratory effort normal. Cardiovascular system: S1 & S2 heard, RRR. No JVD, murmurs, rubs, gallops  Gastrointestinal system: Abdomen is nondistended, soft and nontender.Normal bowel sounds heard. Central nervous system: Alert and oriented. No focal neurological deficits. Extremities: Symmetric 5 x 5 power. Skin: cut marks noted on forearms Psychiatry: Judgement and insight appear normal. Mood & affect appropriate.     Data Reviewed:   CBC: Recent Labs  Lab 11/04/16 2247 11/05/16 0650 11/06/16 0728  WBC 4.1 3.7* 3.3*  NEUTROABS  --  2.3 2.2  HGB 7.2* 6.3* 7.0*  HCT 27.3* 23.1* 25.6*  MCV 66.4* 65.8* 67.4*  PLT 123* 88* 108*   Basic Metabolic Panel: Recent Labs  Lab 11/04/16 2247 11/06/16 0728  NA 139 142  K 3.9 4.0  CL 110 114*  CO2 23 23  GLUCOSE  94 89  BUN 13 16  CREATININE 1.01 0.96  CALCIUM 8.0* 7.9*  MG  --  1.9   GFR: Estimated Creatinine Clearance: 129.2 mL/min (by C-G formula based on SCr of 0.96 mg/dL). Liver Function Tests: Recent Labs  Lab 11/04/16 2247  AST 18  ALT 14*  ALKPHOS 176*  BILITOT 0.9  PROT 6.6  ALBUMIN  3.3*   No results for input(s): LIPASE, AMYLASE in the last 168 hours. No results for input(s): AMMONIA in the last 168 hours. Coagulation Profile: Recent Labs  Lab 11/06/16 0728  INR 1.32   Cardiac Enzymes: No results for input(s): CKTOTAL, CKMB, CKMBINDEX, TROPONINI in the last 168 hours. BNP (last 3 results) No results for input(s): PROBNP in the last 8760 hours. HbA1C: No results for input(s): HGBA1C in the last 72 hours. CBG: No results for input(s): GLUCAP in the last 168 hours. Lipid Profile: No results for input(s): CHOL, HDL, LDLCALC, TRIG, CHOLHDL, LDLDIRECT in the last 72 hours. Thyroid Function Tests: Recent Labs    11/06/16 0728  TSH 2.036   Anemia Panel: Recent Labs    11/05/16 0001 11/05/16 0320 11/05/16 0324  VITAMINB12  --  393  --   FOLATE  --   --  16.6  FERRITIN  --  2*  --   TIBC  --  403  --   IRON  --  19*  --   RETICCTPCT 1.1  --   --    Urine analysis: No results found for: COLORURINE, APPEARANCEUR, LABSPEC, PHURINE, GLUCOSEU, HGBUR, BILIRUBINUR, KETONESUR, PROTEINUR, UROBILINOGEN, NITRITE, LEUKOCYTESUR Sepsis Labs: @LABRCNTIP (procalcitonin:4,lacticidven:4)  )No results found for this or any previous visit (from the past 240 hour(s)).       Radiology Studies: No results found.      Scheduled Meds: . docusate sodium  100 mg Oral Daily  . feeding supplement (ENSURE ENLIVE)  237 mL Oral BID BM  . gabapentin  100 mg Oral TID  . mirtazapine  15 mg Oral QHS  . prenatal multivitamin  1 tablet Oral Q1200  . senna-docusate  1 tablet Oral BID   Continuous Infusions: . sodium chloride       LOS: 0 days    Time spent:    Zachary Cove, MD [facility] Page via [email or web address], password [email or web address] After 7PM please contact night-coverage  11/06/2016, 12:24 PM

## 2016-11-07 LAB — CBC
HCT: 25.9 % — ABNORMAL LOW (ref 39.0–52.0)
HCT: 28.2 % — ABNORMAL LOW (ref 39.0–52.0)
Hemoglobin: 7.2 g/dL — ABNORMAL LOW (ref 13.0–17.0)
Hemoglobin: 7.9 g/dL — ABNORMAL LOW (ref 13.0–17.0)
MCH: 18.9 pg — AB (ref 26.0–34.0)
MCH: 19 pg — AB (ref 26.0–34.0)
MCHC: 27.8 g/dL — ABNORMAL LOW (ref 30.0–36.0)
MCHC: 28 g/dL — ABNORMAL LOW (ref 30.0–36.0)
MCV: 68 fL — AB (ref 78.0–100.0)
MCV: 68 fL — AB (ref 78.0–100.0)
PLATELETS: 95 10*3/uL — AB (ref 150–400)
PLATELETS: 129 10*3/uL — AB (ref 150–400)
RBC: 3.81 MIL/uL — ABNORMAL LOW (ref 4.22–5.81)
RBC: 4.15 MIL/uL — ABNORMAL LOW (ref 4.22–5.81)
RDW: 22 % — AB (ref 11.5–15.5)
RDW: 22.1 % — AB (ref 11.5–15.5)
WBC: 3.3 10*3/uL — ABNORMAL LOW (ref 4.0–10.5)
WBC: 3.7 10*3/uL — AB (ref 4.0–10.5)

## 2016-11-07 LAB — COMPREHENSIVE METABOLIC PANEL
ALT: 10 U/L — ABNORMAL LOW (ref 17–63)
AST: 13 U/L — ABNORMAL LOW (ref 15–41)
Albumin: 2.9 g/dL — ABNORMAL LOW (ref 3.5–5.0)
Alkaline Phosphatase: 151 U/L — ABNORMAL HIGH (ref 38–126)
Anion gap: 5 (ref 5–15)
BILIRUBIN TOTAL: 0.8 mg/dL (ref 0.3–1.2)
BUN: 19 mg/dL (ref 6–20)
CHLORIDE: 115 mmol/L — AB (ref 101–111)
CO2: 24 mmol/L (ref 22–32)
CREATININE: 0.95 mg/dL (ref 0.61–1.24)
Calcium: 8 mg/dL — ABNORMAL LOW (ref 8.9–10.3)
Glucose, Bld: 87 mg/dL (ref 65–99)
POTASSIUM: 4.1 mmol/L (ref 3.5–5.1)
Sodium: 144 mmol/L (ref 135–145)
Total Protein: 5.5 g/dL — ABNORMAL LOW (ref 6.5–8.1)

## 2016-11-07 LAB — HIV ANTIBODY (ROUTINE TESTING W REFLEX): HIV Screen 4th Generation wRfx: NONREACTIVE

## 2016-11-07 MED ORDER — VITAMIN B-12 1000 MCG PO TABS
1000.0000 ug | ORAL_TABLET | Freq: Every day | ORAL | Status: DC
  Administered 2016-11-09 – 2016-11-11 (×3): 1000 ug via ORAL
  Filled 2016-11-07 (×3): qty 1

## 2016-11-07 MED ORDER — PREMIER PROTEIN SHAKE
11.0000 [oz_av] | Freq: Two times a day (BID) | ORAL | Status: DC
  Administered 2016-11-07 – 2016-11-11 (×8): 11 [oz_av] via ORAL
  Filled 2016-11-07 (×9): qty 325.31

## 2016-11-07 NOTE — Progress Notes (Signed)
Initial Nutrition Assessment  DOCUMENTATION CODES:   Morbid obesity, Non-severe (moderate) malnutrition in context of social or environmental circumstances  INTERVENTION:   Provide Premier Protein BID, each supplement provides 160kcal and 30g protein.   **Recommend checking copper levels.**  **Recommend supplementing 100 mg thiamine for 3 days.**  NUTRITION DIAGNOSIS:   Moderate Malnutrition related to social / environmental circumstances(depression) as evidenced by percent weight loss, energy intake < 75% for > or equal to 3 months.  GOAL:   Patient will meet greater than or equal to 90% of their needs  MONITOR:   PO intake, Supplement acceptance, Labs, Weight trends  REASON FOR ASSESSMENT:   Malnutrition Screening Tool   ASSESSMENT:   Pt with PMH significant for gastric bypass surgery 12 yr ago (non-compliant with vitamin/minerals), microcytic anemia, bipolar disease, DDD, and fibromyalgia. Presents this admission with suicidal ideations and anemia (most likely multifactorial, including nutrition deficiency s/p gastric bypass).   Spoke with pt at bedside.  Reports having decreased PO intake for three months related to depression symptoms.  States when he begins feeling better he stops taking his medication for depression.  Typically consumes one meal per day that consist of "not the best options." Does not consume supplementation at home.  Is non compliant with vitamins and minerals s/p gastric bypass surgery.  Reports his doctor is in the [employer] and he has a hard time getting prescriptions.  Pt noted to weigh 600 lb before surgery to which his weight decreased to 320 lb post surgery. Pt stayed at 320 lb for months until recently when he lost 30 lbs unintentionally. This shows a 9.3% wt loss in 3 months. This is significant.  Nutrition-Focused physical exam completed. Due to non-compliance of recommend vitamin regimen, suspect pt needs thiamine supplementation.    Medications reviewed and include: prenatal MVI, senokot, Vit B-12 Labs reviewed: ALP 151 (H) Iron 19 (L) Ferritin 2 (L)  NUTRITION - FOCUSED PHYSICAL EXAM:    Most Recent Value  Orbital Region  No depletion  Upper Arm Region  No depletion  Thoracic and Lumbar Region  No depletion  Buccal Region  No depletion  Temple Region  No depletion  Clavicle Bone Region  No depletion  Clavicle and Acromion Bone Region  No depletion  Scapular Bone Region  No depletion  Dorsal Hand  No depletion  Patellar Region  No depletion  Anterior Thigh Region  No depletion  Posterior Calf Region  No depletion  Edema (RD Assessment)  None  Hair  Reviewed  Eyes  Reviewed  Mouth  Reviewed  Skin  Reviewed [pale]  Nails  Reviewed      Diet Order:  Diet regular Room service appropriate? Yes; Fluid consistency: Thin  EDUCATION NEEDS:   Education needs have been addressed  Skin:  Skin Assessment: Reviewed RN Assessment  Last BM:  11/06/16  Height:   Ht Readings from Last 1 Encounters:  11/05/16 5\' 9"  (1.753 m)    Weight:   Wt Readings from Last 1 Encounters:  11/05/16 290 lb (131.5 kg)    Ideal Body Weight:  72.7 kg  BMI:  Body mass index is 42.83 kg/m.  Estimated Nutritional Needs:   Kcal:  1800-2100 kcal/kg  Protein:  100-110 g/day  Fluid:  >1.8 L/day    Zachary [name] RD, LDN Clinical Nutrition Pager # - [phone]

## 2016-11-07 NOTE — Care Management Note (Signed)
Case Management Note  Patient Details  Name: Zachary [name] MRN: [id number] Date of Birth: 1970-02-17  Subjective/Objective:                  Suicidal ideation  Action/Plan: Date:  November 07, 2016 Chart reviewed for concurrent status and case management needs.  Will continue to follow patient progress.  Discharge Planning: following for needs  Expected discharge date: November 10, 2016  Zachary [name], BSN, [place]3, [place]M   [phone]   Expected Discharge Date:                  [facility]  In-House Referral:  Clinical Social Work  Discharge planning Services  CM Consult  Post Acute Care Choice:    Choice offered to:     DME Arranged:    DME Agency:     HH Arranged:    HH Agency:     Status of Service:  In process, will continue to follow  If discussed at Long Length of Stay Meetings, dates discussed:    Additional Comments:  Zachary [name], Zachary [name] Lynn, RN 11/07/2016, 10:34 AM

## 2016-11-07 NOTE — Progress Notes (Signed)
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  Assessment & Plan:   Suicidal ideation -Continue sitter at bedside, psych consult requested -Suicide precautions  Severe Microcytic anemia/pancytopenia -Anemia panel notable for severe iron deficiency -Hb was 6.3 on admission, status post 1 unit PRBC 11/3, repeat was 7 yesterday and got another unit -Hb this am is only 7.2 after 2 units, could be lab error, will repeat if truly low will give another unit -His previous hemoglobins have been ranging from 8-9 range 2 years ago suspect primarily nutritional deficiencies due to gastric bypass and noncompliance with vitamins, minerals and iron supplementation for 10+years -no history of blood in stools or black stools no hematemesis reported either, declines rectal exam, Hemoccult stool pending but do not think this is source  -called and d/w Heme Dr.Ennever due to concomitant leukopenia and thrombocytopenia, he recommended Iv Iron, Supplemental B12 and check Copper level, he also agreed this is most likely progression of nutritional deficiencies following gastric bypass, copper pending, check methyl malonic acid    Posttraumatic stress disorder/bipolar disorder, depression -chronic pain and fibromyalgia -Continue Robaxin when necessary, trazodone -continue  Remeron    Morbid obesity (HCC) -not an active issue    Benign essential HTN -stable, no longer taking lisinopril  DVT prophylaxis:SCDs Code  Status: full code Family Communication:no family at bedside Disposition Plan: behavioral Health pending medical stabilization, possibly tomorrow  Consultants:   D/w Dr.Ennever   Procedures:   Antimicrobials:    Subjective: -continues to have suicidal thoughts and ideations, no other complaints  Objective: Vitals:   11/06/16 1609 11/06/16 1634 11/06/16 2207 11/07/16 0606  BP: (!) 146/84 (!) 144/74 (!) 150/79 135/63  Pulse:  70  66  Resp: 16 17 17 17   Temp: 98.6 F (37 C) 98.7 F (37.1 C) 98.1 F (36.7 C) 98 F (36.7 C)  TempSrc: Oral Oral Oral Oral  SpO2: 100% 99% 98%   Weight:      Height:        Intake/Output Summary (Last 24 hours) at 11/07/2016 1319 Last data filed at 11/07/2016 1102 Gross per 24 hour  Intake 720 ml  Output -  Net 720 ml   Filed Weights   11/05/16 0727  Weight: 131.5 kg (290 lb)    Examination:  Gen: Awake, Alert, Oriented X 3, obese male HEENT: PERRLA, Neck supple, no JVD Lungs: Good air movement bilaterally, CTAB CVS: RRR,No Gallops,Rubs or new Murmurs Abd: soft, Non tender, non distended, BS present Extremities: No Cyanosis, Clubbing or edema Skin: no new rashes Psychiatry: Judgement and insight appear normal. Mood & affect appropriate.     Data Reviewed:   CBC: Recent Labs  Lab 11/04/16 2247 11/05/16 0650 11/06/16 0728 11/07/16 0606 11/07/16 0822  WBC 4.1 3.7* 3.3* 3.3* 3.7*  NEUTROABS  --  2.3 2.2  --   --   HGB 7.2* 6.3* 7.0* 7.2* 7.9*  HCT 27.3* 23.1* 25.6* 25.9* 28.2*  MCV 66.4* 65.8* 67.4* 68.0* 68.0*  PLT 123* 88* 108* 95* 129*  Basic Metabolic Panel: Recent Labs  Lab 11/04/16 2247 11/06/16 0728 11/07/16 0606  NA 139 142 144  K 3.9 4.0 4.1  CL 110 114* 115*  CO2 23 23 24   GLUCOSE 94 89 87  BUN 13 16 19   CREATININE 1.01 0.96 0.95  CALCIUM 8.0* 7.9* 8.0*  MG  --  1.9  --    GFR: Estimated Creatinine Clearance: 130.6 mL/min (by C-G formula based on SCr of 0.95 mg/dL). Liver Function Tests: Recent  Labs  Lab 11/04/16 2247 11/07/16 0606  AST 18 13*  ALT 14* 10*  ALKPHOS 176* 151*  BILITOT 0.9 0.8  PROT 6.6 5.5*  ALBUMIN 3.3* 2.9*   No results for input(s): LIPASE, AMYLASE in the last 168 hours. No results for input(s): AMMONIA in the last 168 hours. Coagulation Profile: Recent Labs  Lab 11/06/16 0728  INR 1.32   Cardiac Enzymes: No results for input(s): CKTOTAL, CKMB, CKMBINDEX, TROPONINI in the last 168 hours. BNP (last 3 results) No results for input(s): PROBNP in the last 8760 hours. HbA1C: No results for input(s): HGBA1C in the last 72 hours. CBG: No results for input(s): GLUCAP in the last 168 hours. Lipid Profile: No results for input(s): CHOL, HDL, LDLCALC, TRIG, CHOLHDL, LDLDIRECT in the last 72 hours. Thyroid Function Tests: Recent Labs    11/06/16 0728  TSH 2.036   Anemia Panel: Recent Labs    11/05/16 0001 11/05/16 0320 11/05/16 0324  VITAMINB12  --  393  --   FOLATE  --   --  16.6  FERRITIN  --  2*  --   TIBC  --  403  --   IRON  --  19*  --   RETICCTPCT 1.1  --   --    Urine analysis: No results found for: COLORURINE, APPEARANCEUR, LABSPEC, PHURINE, GLUCOSEU, HGBUR, BILIRUBINUR, KETONESUR, PROTEINUR, UROBILINOGEN, NITRITE, LEUKOCYTESUR Sepsis Labs: @LABRCNTIP (procalcitonin:4,lacticidven:4)  )No results found for this or any previous visit (from the past 240 hour(s)).       Radiology Studies: No results found.      Scheduled Meds: . docusate sodium  100 mg Oral Daily  . feeding supplement (ENSURE ENLIVE)  237 mL Oral BID BM  . gabapentin  100 mg Oral TID  . mirtazapine  15 mg Oral QHS  . prenatal multivitamin  1 tablet Oral Q1200  . senna-docusate  1 tablet Oral BID  . [START ON 11/08/2016] vitamin B-12  1,000 mcg Oral Daily   Continuous Infusions: . sodium chloride       LOS: 1 day    Time spent:    Zannie Cove, MD [facility] Page via [email or web address], password [email or web address] After 7PM please contact  night-coverage  11/07/2016, 1:19 PM

## 2016-11-08 LAB — CBC
HCT: 25.2 % — ABNORMAL LOW (ref 39.0–52.0)
Hemoglobin: 7.2 g/dL — ABNORMAL LOW (ref 13.0–17.0)
MCH: 19.7 pg — ABNORMAL LOW (ref 26.0–34.0)
MCHC: 28.6 g/dL — ABNORMAL LOW (ref 30.0–36.0)
MCV: 68.9 fL — ABNORMAL LOW (ref 78.0–100.0)
PLATELETS: 107 10*3/uL — AB (ref 150–400)
RBC: 3.66 MIL/uL — ABNORMAL LOW (ref 4.22–5.81)
RDW: 22.9 % — AB (ref 11.5–15.5)
WBC: 3 10*3/uL — AB (ref 4.0–10.5)

## 2016-11-08 LAB — PREPARE RBC (CROSSMATCH)

## 2016-11-08 LAB — ERYTHROPOIETIN: ERYTHROPOIETIN: 162.2 m[IU]/mL — AB (ref 2.6–18.5)

## 2016-11-08 MED ORDER — CYANOCOBALAMIN 1000 MCG/ML IJ SOLN
1000.0000 ug | Freq: Once | INTRAMUSCULAR | Status: AC
  Administered 2016-11-08: 1000 ug via INTRAMUSCULAR
  Filled 2016-11-08: qty 1

## 2016-11-08 MED ORDER — SODIUM CHLORIDE 0.9 % IV SOLN
Freq: Once | INTRAVENOUS | Status: AC
  Administered 2016-11-08: 11:00:00 via INTRAVENOUS

## 2016-11-08 NOTE — Progress Notes (Signed)
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 does not take any supplemental iron folate or B-12, history of microcytic anemia, bipolar disorder, fibromyalgia and degenerative disc disease presented to behavioral health due to suicidal ideation and then was referred to the emergency room due to lack of beds at [facility] where he was noted to have a hemoglobin of 7.2 and repeat at 6.3, admitted to [facility] Given blood, felt to have progression of nutritional anemia post gastric bypass and noncompliance with supplements for >10-12years  Assessment & Plan:   Suicidal ideation -Continue sitter at bedside, psych consult requested -Suicide precautions -[facility]HC when stable, possibly tomorrow   Severe Microcytic anemia/pancytopenia -Anemia panel notable for severe iron deficiency -Hb was 6.3 on admission, status post 2units PRBC -Hb this am is only 7.2, will transfuse another unit -His previous hemoglobins have been ranging from 8-9 range from 2 years ago suspect primarily nutritional deficiencies due to gastric bypass and noncompliance with vitamins, minerals and iron supplementation for 10+years -no history of blood in stools or black stools no hematemesis reported either, declined rectal exam, Hemoccult stool pending but do not think this is source  -called and d/w Heme Dr.Ennever 11/4 due to concomitant leukopenia and thrombocytopenia, he recommended Iv Iron, Supplemental B12 IM and check Copper level, he also agreed this is most likely progression of nutritional deficiencies following gastric bypass, Copper level and Methylmalonic acid pending    Posttraumatic stress disorder/bipolar disorder, depression -chronic pain and fibromyalgia -Continue Robaxin when necessary, trazodone -continue  Remeron    Morbid obesity (HCC) -not an active  issue    Benign essential HTN -stable, no longer taking lisinopril  DVT prophylaxis:SCDs Code Status: full code Family Communication:no family at bedside Disposition Plan: behavioral Health pending medical stabilization, possibly tomorrow  Consultants:   D/w Dr.Ennever   Procedures:   Antimicrobials:    Subjective: -continues to have suicidal thoughts and ideations, no other complaints -asymptomatic from chronic anemia  Objective: Vitals:   11/08/16 1152 11/08/16 1210 11/08/16 1400 11/08/16 1411  BP: (!) 144/67 (!) 133/57 137/60 137/72  Pulse: 74 74 61 65  Resp: 16 16 16 16   Temp: 97.9 F (36.6 C)  97.8 F (36.6 C) 97.8 F (36.6 C)  TempSrc: Oral Oral Oral Oral  SpO2: 100% 100% 100% 100%  Weight:      Height:        Intake/Output Summary (Last 24 hours) at 11/08/2016 1442 Last data filed at 11/08/2016 1432 Gross per 24 hour  Intake 1510 ml  Output -  Net 1510 ml   Filed Weights   11/05/16 0727  Weight: 131.5 kg (290 lb)    Examination:  Gen: Awake, Alert, Oriented X 3, obese, apathic, no distress HEENT: PERRLA, Neck supple, no JVD Lungs: Good air movement bilaterally, CTAB CVS: RRR,No Gallops,Rubs or new Murmurs Abd: soft, Non tender, non distended, BS present Extremities: No Cyanosis, Clubbing or edema Skin: no new rashes Psychiatry: depressed mood    Data Reviewed:   CBC: Recent Labs  Lab 11/05/16 0650 11/06/16 0728 11/07/16 0606 11/07/16 0822 11/08/16 0814  WBC 3.7* 3.3* 3.3* 3.7* 3.0*  NEUTROABS 2.3 2.2  --   --   --   HGB 6.3* 7.0* 7.2* 7.9* 7.2*  HCT 23.1* 25.6* 25.9* 28.2* 25.2*  MCV 65.8* 67.4* 68.0* 68.0* 68.9*  PLT 88* 108* 95* 129*  107*   Basic Metabolic Panel: Recent Labs  Lab 11/04/16 2247 11/06/16 0728 11/07/16 0606  NA 139 142 144  K 3.9 4.0 4.1  CL 110 114* 115*  CO2 23 23 24   GLUCOSE 94 89 87  BUN 13 16 19   CREATININE 1.01 0.96 0.95  CALCIUM 8.0* 7.9* 8.0*  MG  --  1.9  --    GFR: Estimated Creatinine  Clearance: 130.6 mL/min (by C-G formula based on SCr of 0.95 mg/dL). Liver Function Tests: Recent Labs  Lab 11/04/16 2247 11/07/16 0606  AST 18 13*  ALT 14* 10*  ALKPHOS 176* 151*  BILITOT 0.9 0.8  PROT 6.6 5.5*  ALBUMIN 3.3* 2.9*   No results for input(s): LIPASE, AMYLASE in the last 168 hours. No results for input(s): AMMONIA in the last 168 hours. Coagulation Profile: Recent Labs  Lab 11/06/16 0728  INR 1.32   Cardiac Enzymes: No results for input(s): CKTOTAL, CKMB, CKMBINDEX, TROPONINI in the last 168 hours. BNP (last 3 results) No results for input(s): PROBNP in the last 8760 hours. HbA1C: No results for input(s): HGBA1C in the last 72 hours. CBG: No results for input(s): GLUCAP in the last 168 hours. Lipid Profile: No results for input(s): CHOL, HDL, LDLCALC, TRIG, CHOLHDL, LDLDIRECT in the last 72 hours. Thyroid Function Tests: Recent Labs    11/06/16 0728  TSH 2.036   Anemia Panel: No results for input(s): VITAMINB12, FOLATE, FERRITIN, TIBC, IRON, RETICCTPCT in the last 72 hours. Urine analysis: No results found for: COLORURINE, APPEARANCEUR, LABSPEC, PHURINE, GLUCOSEU, HGBUR, BILIRUBINUR, KETONESUR, PROTEINUR, UROBILINOGEN, NITRITE, LEUKOCYTESUR Sepsis Labs: @LABRCNTIP (procalcitonin:4,lacticidven:4)  )No results found for this or any previous visit (from the past 240 hour(s)).       Radiology Studies: No results found.      Scheduled Meds: . docusate sodium  100 mg Oral Daily  . gabapentin  100 mg Oral TID  . mirtazapine  15 mg Oral QHS  . prenatal multivitamin  1 tablet Oral Q1200  . protein supplement shake  11 oz Oral BID BM  . senna-docusate  1 tablet Oral BID  . vitamin B-12  1,000 mcg Oral Daily   Continuous Infusions:    LOS: 2 days    Time spent:    Zannie Cove, MD [facility] Page via [email or web address], password [email or web address] After 7PM please contact night-coverage  11/08/2016, 2:42 PM

## 2016-11-09 LAB — BPAM RBC
BLOOD PRODUCT EXPIRATION DATE: 201811252359
BLOOD PRODUCT EXPIRATION DATE: 201811262359
Blood Product Expiration Date: 201811222359
ISSUE DATE / TIME: 201811031222
ISSUE DATE / TIME: 201811061144
ISSUE DATE / TIME: 201811040835
UNIT TYPE AND RH: 7300
UNIT TYPE AND RH: 7300
Unit Type and Rh: 7300

## 2016-11-09 LAB — CBC
HCT: 27.6 % — ABNORMAL LOW (ref 39.0–52.0)
Hemoglobin: 8.2 g/dL — ABNORMAL LOW (ref 13.0–17.0)
MCH: 20.9 pg — ABNORMAL LOW (ref 26.0–34.0)
MCHC: 29.7 g/dL — AB (ref 30.0–36.0)
MCV: 70.4 fL — ABNORMAL LOW (ref 78.0–100.0)
Platelets: 104 10*3/uL — ABNORMAL LOW (ref 150–400)
RBC: 3.92 MIL/uL — ABNORMAL LOW (ref 4.22–5.81)
RDW: 23.4 % — AB (ref 11.5–15.5)
WBC: 3.1 10*3/uL — ABNORMAL LOW (ref 4.0–10.5)

## 2016-11-09 LAB — TYPE AND SCREEN
ABO/RH(D): B POS
ANTIBODY SCREEN: NEGATIVE
Unit division: 0
Unit division: 0
Unit division: 0

## 2016-11-09 LAB — METHYLMALONIC ACID, SERUM: METHYLMALONIC ACID, QUANTITATIVE: 247 nmol/L (ref 0–378)

## 2016-11-09 LAB — COPPER, SERUM: COPPER: 56 ug/dL — AB (ref 72–166)

## 2016-11-09 MED ORDER — PROSIGHT PO TABS
1.0000 | ORAL_TABLET | Freq: Every day | ORAL | Status: DC
  Administered 2016-11-09 – 2016-11-11 (×3): 1 via ORAL
  Filled 2016-11-09 (×3): qty 1

## 2016-11-09 NOTE — Progress Notes (Signed)
Visited patient to discuss resources for post bariatric surgical patients.  Patient provided with written material regarding bariatric vitamins.  We discussed the need for supplements for the rest of patient life to decrease deficiencies patient is currently experiencing.  At this time the patient states that vitamins and proteins are categorized to bottom of his financial list.  I offered several suggestions to reduce cost of vitamins.  Patient stated understanding and at this time refused further resources.  Name and number left for patient should resources be needed in the future.

## 2016-11-09 NOTE — Clinical Social Work Note (Signed)
Clinical Social Work Assessment  Patient Details  Name: Zachary [name] MRN: [id number] Date of Birth: 06-Apr-1970  Date of referral:  11/09/16               Reason for consult:  Discharge Planning(inpatient psych)                Permission sought to share information with:  Case Manager, Facility Sport and exercise psychologist Permission granted to share information::  Yes, Verbal Permission Granted  Name::        Agency::     Relationship::     Contact Information:     Housing/Transportation Living arrangements for the past 2 months:  Single Family Home Source of Information:  Patient, Medical Team Patient Interpreter Needed:  None Criminal Activity/Legal Involvement Pertinent to Current Situation/Hospitalization:  No - Comment as needed Significant Relationships:  Friend Lives with:  Significant Other, Roommate Do you feel safe going back to the place where you live?  Yes Need for family participation in patient care:  Yes (Comment)  Care giving concerns:  No care giving concerns at the time of assessment.    Social Worker assessment / plan:  LCSW following for inpatient psych placement.  Patient was admitted for Bipolar disorder, current episode depressed, severe, without psychotic features ([place])  LCSW met with patient at bedside. Patient is agreeable to inpatient psych at [facility].   Currently patient reports that he lives with his roommate and his girlfriend. Patient does not work and does not have insurance.   Patient reports he has applied for disability and has been denied. Patient expressed is frustration with his disability decision stating that  " I doesn't understand how sick or crazy I have to be to prove I need disability"   Patient has several physical and psychological ailments. Patient is morbidly obese and reports that home he uses to canes for mobility.  PLAN: Patient will go to inpatient psych once medically ready.   Employment status:  Unemployed Radiation protection practitioner:  Self Pay (Medicaid Pending) PT Recommendations:  Not assessed at this time Information / Referral to community resources:  Inpatient Psychiatric Care (Comment Required)(Patient recommended for inpatient psych at [place])  Patient/Family's Response to care:  Patient is responsive to care. Patient is accepting and grateful.  Patient/Family's Understanding of and Emotional Response to Diagnosis, Current Treatment, and Prognosis:  Patient is understanding of his diagnosis and agreeable to the current treatment plan.   Emotional Assessment Appearance:  Appears stated age Attitude/Demeanor/Rapport:    Affect (typically observed):  Frustrated, Hopeless, Calm, Overwhelmed Orientation:  Oriented to Self, Oriented to Situation, Oriented to Place Alcohol / Substance use:  Not Applicable Psych involvement (Current and /or in the community):  Yes (Comment)(pt not followed in the community)  Discharge Needs  Concerns to be addressed:  Financial / Insurance Concerns Readmission within the last 30 days:  No Current discharge risk:  Psychiatric Illness Barriers to Discharge:  Continued Medical Work up   [employer], LCSW 11/09/2016, 1:17 PM

## 2016-11-09 NOTE — Progress Notes (Signed)
PROGRESS NOTE    Zachary [name]  ZOX:[id number] DOB: 05-16-1970 DOA: 11/04/2016 PCP: Dartha Lodge, FNP    Brief Narrative:  /o obesity Body mass index is 42.83 kg/m. s/p gastric bypass surgery 24yr ago. (He does not have primary care doctor, he does not take iron/folate/b12/vitamin supplement), history of microcytic anemia, h/o kidney stone, history of bipolar disease, history of fibromyalgia and degenerative disc disease, he self-referred to behavioral health hospital due to suicidal ideation. he is referred to ED due to no bed available.    ED course: vital signs are stable. Initial CBC hemoglobin 7.2, repeat CBC 6.3, no overt bleeding. He reports fatigue, denies sob, no chest pain, he reports chronic dizziness ,walks with two canes and use wheelchair at baseline.  Patient declined rectal exam, he denies of abdominal pain, denies of blood in the stool or urine.  Hospitalist called to admit the patient.  Assessment & Plan:   Principal Problem:   Bipolar disorder, current episode depressed, severe, without psychotic features (HCC) Active Problems:   Posttraumatic stress disorder   Morbid obesity (HCC)   Benign essential HTN   Anemia   Suicidal ideation   Suicidal ideation -Continue sitter at bedside, psych consult requested -Suicide precautions -[facility] when stable, possibly tomorrow   Severe Microcytic anemia/pancytopenia  iron deficiency Anemia - Iron 19, Ferritin 2 - S/p transfusion of pRBC on 11/3, 11/4, and 11/6 - s/p feraheme on 11/4 - Today H/H 8.2 from 7.2 yesterday after 1 unit pRBC (6.3 on admission) - Hb in 8-9 range in 2016 - suspect primarily nutritional deficiencies due to gastric bypass and noncompliance with vitamins, minerals and iron supplementation for 10+years -no history of blood in stools or black stools no hematemesis reported either, declined rectal exam, Hemoccult stool pending but do not think this is source  -called and d/w Heme Dr.Ennever 11/4  due to concomitant leukopenia and thrombocytopenia, he recommended Iv Iron (given above), Supplemental B12 IM and check Copper level, he also agreed this is most likely progression of nutritional deficiencies following gastric bypass, Copper level (low) and Methylmalonic acid (normal)    Posttraumatic stress disorder/bipolar disorder, depression -chronic pain and fibromyalgia -Continue Robaxin when necessary, trazodone -continue  Remeron    Morbid obesity (HCC) -not an active issue    Benign essential HTN -stable, no longer taking lisinopril  DVT prophylaxis: SCD Code Status: full  Family Communication: none at bedside Disposition Plan: inpatient psych when medically clear   Consultants:   Oncology, phone  Procedures: (Don't include imaging studies which can be auto populated. Include things that cannot be auto populated i.e. Echo, Carotid and venous dopplers, Foley, Bipap, HD, tubes/drains, wound vac, central lines etc)  none  Antimicrobials: (specify start and planned stop date. Auto populated tables are space occupying and do not give end dates)  none    Subjective: Denies SI, but notes depression. Dreams, feelings of inadequacy. No LH.  No blood in stools.     Objective: Vitals:   11/08/16 1400 11/08/16 1411 11/08/16 1917 11/09/16 0642  BP: 137/60 137/72 (!) 150/82 127/69  Pulse: 61 65 77 68  Resp: 16 16 16 15   Temp: 97.8 F (36.6 C) 97.8 F (36.6 C) 97.6 F (36.4 C) 97.8 F (36.6 C)  TempSrc: Oral Oral Oral Oral  SpO2: 100% 100% 100% 98%  Weight:      Height:        Intake/Output Summary (Last 24 hours) at 11/09/2016 1347 Last data filed at 11/09/2016 0953 Gross per  24 hour  Intake 1305 ml  Output -  Net 1305 ml   Filed Weights   11/05/16 0727  Weight: 131.5 kg (290 lb)    Examination:  General exam: Appears calm and comfortable  Respiratory system: Clear to auscultation. Respiratory effort normal. Cardiovascular system: S1 & S2 heard,  RRR. No JVD, murmurs, rubs, gallops or clicks. No pedal edema. Gastrointestinal system: Abdomen is nondistended, soft and nontender. No organomegaly or masses felt. Normal bowel sounds heard. Central nervous system: Alert and oriented. No focal neurological deficits. Extremities: Symmetric 5 x 5 power. Skin: No rashes, lesions or ulcers Psychiatry: Judgement and insight appear normal. Mood & affect appropriate.     Data Reviewed: I have personally reviewed following labs and imaging studies  CBC: Recent Labs  Lab 11/05/16 0650 11/06/16 0728 11/07/16 0606 11/07/16 0822 11/08/16 0814 11/09/16 0606  WBC 3.7* 3.3* 3.3* 3.7* 3.0* 3.1*  NEUTROABS 2.3 2.2  --   --   --   --   HGB 6.3* 7.0* 7.2* 7.9* 7.2* 8.2*  HCT 23.1* 25.6* 25.9* 28.2* 25.2* 27.6*  MCV 65.8* 67.4* 68.0* 68.0* 68.9* 70.4*  PLT 88* 108* 95* 129* 107* 104*   Basic Metabolic Panel: Recent Labs  Lab 11/04/16 2247 11/06/16 0728 11/07/16 0606  NA 139 142 144  K 3.9 4.0 4.1  CL 110 114* 115*  CO2 23 23 24   GLUCOSE 94 89 87  BUN 13 16 19   CREATININE 1.01 0.96 0.95  CALCIUM 8.0* 7.9* 8.0*  MG  --  1.9  --    GFR: Estimated Creatinine Clearance: 130.6 mL/min (by C-G formula based on SCr of 0.95 mg/dL). Liver Function Tests: Recent Labs  Lab 11/04/16 2247 11/07/16 0606  AST 18 13*  ALT 14* 10*  ALKPHOS 176* 151*  BILITOT 0.9 0.8  PROT 6.6 5.5*  ALBUMIN 3.3* 2.9*   No results for input(s): LIPASE, AMYLASE in the last 168 hours. No results for input(s): AMMONIA in the last 168 hours. Coagulation Profile: Recent Labs  Lab 11/06/16 0728  INR 1.32   Cardiac Enzymes: No results for input(s): CKTOTAL, CKMB, CKMBINDEX, TROPONINI in the last 168 hours. BNP (last 3 results) No results for input(s): PROBNP in the last 8760 hours. HbA1C: No results for input(s): HGBA1C in the last 72 hours. CBG: No results for input(s): GLUCAP in the last 168 hours. Lipid Profile: No results for input(s): CHOL, HDL,  LDLCALC, TRIG, CHOLHDL, LDLDIRECT in the last 72 hours. Thyroid Function Tests: No results for input(s): TSH, T4TOTAL, FREET4, T3FREE, THYROIDAB in the last 72 hours. Anemia Panel: No results for input(s): VITAMINB12, FOLATE, FERRITIN, TIBC, IRON, RETICCTPCT in the last 72 hours. Sepsis Labs: No results for input(s): PROCALCITON, LATICACIDVEN in the last 168 hours.  No results found for this or any previous visit (from the past 240 hour(s)).       Radiology Studies: No results found.      Scheduled Meds: . docusate sodium  100 mg Oral Daily  . gabapentin  100 mg Oral TID  . mirtazapine  15 mg Oral QHS  . prenatal multivitamin  1 tablet Oral Q1200  . protein supplement shake  11 oz Oral BID BM  . senna-docusate  1 tablet Oral BID  . vitamin B-12  1,000 mcg Oral Daily   Continuous Infusions:   LOS: 3 days    Time spent: over 30 minutes.     Lacretia Nicksaldwell Powell, MD [facility] Pager [phone]  If 7PM-7AM, please contact night-coverage [email or web address]  Password [email or web address] 11/09/2016, 1:47 PM

## 2016-11-10 LAB — CBC
HCT: 29.7 % — ABNORMAL LOW (ref 39.0–52.0)
Hemoglobin: 8.4 g/dL — ABNORMAL LOW (ref 13.0–17.0)
MCH: 20.2 pg — AB (ref 26.0–34.0)
MCHC: 28.3 g/dL — ABNORMAL LOW (ref 30.0–36.0)
MCV: 71.6 fL — AB (ref 78.0–100.0)
PLATELETS: 109 10*3/uL — AB (ref 150–400)
RBC: 4.15 MIL/uL — ABNORMAL LOW (ref 4.22–5.81)
RDW: 24.3 % — AB (ref 11.5–15.5)
WBC: 3.8 10*3/uL — AB (ref 4.0–10.5)

## 2016-11-10 LAB — BASIC METABOLIC PANEL
Anion gap: 6 (ref 5–15)
BUN: 23 mg/dL — AB (ref 6–20)
CALCIUM: 8 mg/dL — AB (ref 8.9–10.3)
CO2: 23 mmol/L (ref 22–32)
Chloride: 114 mmol/L — ABNORMAL HIGH (ref 101–111)
Creatinine, Ser: 1.03 mg/dL (ref 0.61–1.24)
GFR calc Af Amer: >60 mL/min (ref >60)
GLUCOSE: 95 mg/dL (ref 65–99)
Potassium: 3.9 mmol/L (ref 3.5–5.1)
SODIUM: 143 mmol/L (ref 135–145)

## 2016-11-10 MED ORDER — FERROUS SULFATE 325 (65 FE) MG PO TABS
325.0000 mg | ORAL_TABLET | Freq: Two times a day (BID) | ORAL | Status: DC
  Administered 2016-11-11: 325 mg via ORAL
  Filled 2016-11-10 (×2): qty 1

## 2016-11-10 NOTE — Progress Notes (Signed)
[facility] this [facility]  Patient took out IV and bracelets, threatening to leave AMA.  MD notified.  Called security for support.  MD spoke with patient.  Patient's behavior calmed down.  Patient now resting quietly.

## 2016-11-10 NOTE — Progress Notes (Signed)
PROGRESS NOTE    Zachary [name]  WUJ:[id number] DOB: 21-Jul-1970 DOA: 11/04/2016 PCP: Zachary Lodge, FNP    Brief Narrative:  /o obesity Body mass index is 42.83 kg/m. s/p gastric bypass surgery 57yr ago. (He does not have primary care doctor, he does not take iron/folate/b12/vitamin supplement), history of microcytic anemia, h/o kidney stone, history of bipolar disease, history of fibromyalgia and degenerative disc disease, he self-referred to behavioral health hospital due to suicidal ideation. he is referred to ED due to no bed available.    ED course: vital signs are stable. Initial CBC hemoglobin 7.2, repeat CBC 6.3, no overt bleeding. He reports fatigue, denies sob, no chest pain, he reports chronic dizziness ,walks with two canes and use wheelchair at baseline.  Patient declined rectal exam, he denies of abdominal pain, denies of blood in the stool or urine.  Hospitalist called to admit the patient.  Assessment & Plan:   Principal Problem:   Bipolar disorder, current episode depressed, severe, without psychotic features (HCC) Active Problems:   Posttraumatic stress disorder   Morbid obesity (HCC)   Benign essential HTN   Anemia   Suicidal ideation   Suicidal ideation -Continue sitter at bedside, psych consult requested -Suicide precautions -Currently medically stable for transfer to behavioral health  Severe Microcytic anemia  pancytopenia  iron deficiency Anemia  Copper Deficiency - Iron 19, Ferritin 2 - S/p transfusion of pRBC on 11/3, 11/4, and 11/6 - s/p feraheme on 11/4 - Today H/H is stable at 8.4 from 8.2 yesterday (6.3 on admission) - Hb in 8-9 range in 2016 - suspect primarily nutritional deficiencies due to gastric bypass and noncompliance with vitamins, minerals and iron supplementation for 10+years.  He notes cost is Zachary [name] major barrier.  Will have care management see him about this. -no history of blood in stools or black stools no hematemesis reported  either, declined rectal exam, Hemoccult stool pending but do not think this is source  - Previous provider called and d/w Heme Dr.Ennever 11/4 due to concomitant leukopenia and thrombocytopenia, he recommended Iv Iron (given above), Supplemental B12 and check Copper level, he also agreed this is most likely progression of nutritional deficiencies following gastric bypass, Copper level (low) and Methylmalonic acid (normal) [ ]  consider outpatient oncology f/u if persistent pancytopenia  Started on copper supplementaion with prosight which has 2 mg copper    Posttraumatic stress disorder/bipolar disorder, depression -chronic pain and fibromyalgia -Continue Robaxin when necessary, trazodone -continue  Remeron    Morbid obesity (HCC) -not an active issue    Benign essential HTN -stable, no longer taking lisinopril  DVT prophylaxis: SCD Code Status: full  Family Communication: none at bedside Disposition Plan: inpatient psych when medically clear   Consultants:   Oncology, phone  Procedures: (Don't include imaging studies which can be auto populated. Include things that cannot be auto populated i.e. Echo, Carotid and venous dopplers, Foley, Bipap, HD, tubes/drains, wound vac, central lines etc)  none  Antimicrobials: (specify start and planned stop date. Auto populated tables are space occupying and do not give end dates)  none    Subjective: Denies SI.  Depression 7/10.  No CP, LH, SOB, dark/tarry stools or blood in stools.  Objective: Vitals:   11/09/16 0642 11/09/16 1407 11/09/16 2141 11/10/16 0519  BP: 127/69 136/71 133/64 (!) 128/57  Pulse: 68 70 79 66  Resp: 15 18 18 16   Temp: 97.8 F (36.6 C) 98.2 F (36.8 C) 98.6 F (37 C) 98.1 F (36.7 C)  TempSrc: Oral Oral Oral Oral  SpO2: 98% 98% 100% 97%  Weight:      Height:        Intake/Output Summary (Last 24 hours) at 11/10/2016 0909 Last data filed at 11/09/2016 1430 Gross per 24 hour  Intake 480 ml  Output -   Net 480 ml   Filed Weights   11/05/16 0727  Weight: 131.5 kg (290 lb)    Examination:  General: No acute distress. Cardiovascular: Heart sounds show Zachary [name] regular rate, and rhythm. No gallops or rubs. No murmurs. No JVD. Lungs: Clear to auscultation bilaterally with good air movement. No rales, rhonchi or wheezes. Abdomen: Soft, nontender, nondistended with normal active bowel sounds. No masses. No hepatosplenomegaly. Neurological: Alert and oriented 3. Moves all extremities 4 with equal strength. Cranial nerves II through XII grossly intact. Skin: Warm and dry. No rashes or lesions. Extremities: No clubbing or cyanosis. No edema. Pedal pulses 2+. Psychiatric: Mood and affect are depressed.    Data Reviewed: I have personally reviewed following labs and imaging studies  CBC: Recent Labs  Lab 11/05/16 0650 11/06/16 0728 11/07/16 0606 11/07/16 0822 11/08/16 0814 11/09/16 0606 11/10/16 0540  WBC 3.7* 3.3* 3.3* 3.7* 3.0* 3.1* 3.8*  NEUTROABS 2.3 2.2  --   --   --   --   --   HGB 6.3* 7.0* 7.2* 7.9* 7.2* 8.2* 8.4*  HCT 23.1* 25.6* 25.9* 28.2* 25.2* 27.6* 29.7*  MCV 65.8* 67.4* 68.0* 68.0* 68.9* 70.4* 71.6*  PLT 88* 108* 95* 129* 107* 104* 109*   Basic Metabolic Panel: Recent Labs  Lab 11/04/16 2247 11/06/16 0728 11/07/16 0606 11/10/16 0540  NA 139 142 144 143  K 3.9 4.0 4.1 3.9  CL 110 114* 115* 114*  CO2 23 23 24 23   GLUCOSE 94 89 87 95  BUN 13 16 19  23*  CREATININE 1.01 0.96 0.95 1.03  CALCIUM 8.0* 7.9* 8.0* 8.0*  MG  --  1.9  --   --    GFR: Estimated Creatinine Clearance: 120.4 mL/min (by C-G formula based on SCr of 1.03 mg/dL). Liver Function Tests: Recent Labs  Lab 11/04/16 2247 11/07/16 0606  AST 18 13*  ALT 14* 10*  ALKPHOS 176* 151*  BILITOT 0.9 0.8  PROT 6.6 5.5*  ALBUMIN 3.3* 2.9*   No results for input(s): LIPASE, AMYLASE in the last 168 hours. No results for input(s): AMMONIA in the last 168 hours. Coagulation Profile: Recent Labs  Lab  11/06/16 0728  INR 1.32   Cardiac Enzymes: No results for input(s): CKTOTAL, CKMB, CKMBINDEX, TROPONINI in the last 168 hours. BNP (last 3 results) No results for input(s): PROBNP in the last 8760 hours. HbA1C: No results for input(s): HGBA1C in the last 72 hours. CBG: No results for input(s): GLUCAP in the last 168 hours. Lipid Profile: No results for input(s): CHOL, HDL, LDLCALC, TRIG, CHOLHDL, LDLDIRECT in the last 72 hours. Thyroid Function Tests: No results for input(s): TSH, T4TOTAL, FREET4, T3FREE, THYROIDAB in the last 72 hours. Anemia Panel: No results for input(s): VITAMINB12, FOLATE, FERRITIN, TIBC, IRON, RETICCTPCT in the last 72 hours. Sepsis Labs: No results for input(s): PROCALCITON, LATICACIDVEN in the last 168 hours.  No results found for this or any previous visit (from the past 240 hour(s)).       Radiology Studies: No results found.      Scheduled Meds: . docusate sodium  100 mg Oral Daily  . gabapentin  100 mg Oral TID  . mirtazapine  15 mg  Oral QHS  . multivitamin  1 tablet Oral Daily  . prenatal multivitamin  1 tablet Oral Q1200  . protein supplement shake  11 oz Oral BID BM  . senna-docusate  1 tablet Oral BID  . vitamin B-12  1,000 mcg Oral Daily   Continuous Infusions:   LOS: 4 days    Time spent: over 30 minutes.     Lacretia Nicksaldwell Powell, MD [facility] Pager [phone]  If 7PM-7AM, please contact night-coverage [email or web address] Password [email or web address] 11/10/2016, 9:09 AM

## 2016-11-11 ENCOUNTER — Other Ambulatory Visit: Payer: Self-pay

## 2016-11-11 ENCOUNTER — Encounter (HOSPITAL_COMMUNITY): Payer: Self-pay | Admitting: *Deleted

## 2016-11-11 ENCOUNTER — Inpatient Hospital Stay (HOSPITAL_COMMUNITY)
Admission: AD | Admit: 2016-11-11 | Discharge: 2016-11-14 | DRG: 885 | Disposition: A | Discharge status: Home / Self Care | Payer: Federal, State, Local not specified - Other | Source: Intra-hospital | Attending: Psychiatry | Admitting: Psychiatry

## 2016-11-11 DIAGNOSIS — M797 Fibromyalgia: Secondary | ICD-10-CM | POA: Diagnosis present

## 2016-11-11 DIAGNOSIS — Z6841 Body Mass Index (BMI) 40.0 and over, adult: Secondary | ICD-10-CM | POA: Diagnosis not present

## 2016-11-11 DIAGNOSIS — G8929 Other chronic pain: Secondary | ICD-10-CM | POA: Diagnosis present

## 2016-11-11 DIAGNOSIS — M1990 Unspecified osteoarthritis, unspecified site: Secondary | ICD-10-CM | POA: Diagnosis not present

## 2016-11-11 DIAGNOSIS — Z9884 Bariatric surgery status: Secondary | ICD-10-CM

## 2016-11-11 DIAGNOSIS — F331 Major depressive disorder, recurrent, moderate: Principal | ICD-10-CM | POA: Diagnosis present

## 2016-11-11 DIAGNOSIS — F410 Panic disorder [episodic paroxysmal anxiety] without agoraphobia: Secondary | ICD-10-CM

## 2016-11-11 DIAGNOSIS — Z833 Family history of diabetes mellitus: Secondary | ICD-10-CM | POA: Diagnosis not present

## 2016-11-11 DIAGNOSIS — F411 Generalized anxiety disorder: Secondary | ICD-10-CM | POA: Diagnosis present

## 2016-11-11 DIAGNOSIS — G894 Chronic pain syndrome: Secondary | ICD-10-CM | POA: Diagnosis present

## 2016-11-11 DIAGNOSIS — Z915 Personal history of self-harm: Secondary | ICD-10-CM

## 2016-11-11 DIAGNOSIS — D61818 Other pancytopenia: Secondary | ICD-10-CM

## 2016-11-11 DIAGNOSIS — F329 Major depressive disorder, single episode, unspecified: Secondary | ICD-10-CM | POA: Diagnosis present

## 2016-11-11 DIAGNOSIS — F332 Major depressive disorder, recurrent severe without psychotic features: Secondary | ICD-10-CM | POA: Diagnosis not present

## 2016-11-11 DIAGNOSIS — R45851 Suicidal ideations: Secondary | ICD-10-CM | POA: Diagnosis not present

## 2016-11-11 DIAGNOSIS — F339 Major depressive disorder, recurrent, unspecified: Secondary | ICD-10-CM | POA: Diagnosis present

## 2016-11-11 LAB — HEMOGLOBIN AND HEMATOCRIT, BLOOD
HEMATOCRIT: 30.1 % — AB (ref 39.0–52.0)
HEMOGLOBIN: 8.5 g/dL — AB (ref 13.0–17.0)

## 2016-11-11 MED ORDER — VITAMIN B-1 100 MG PO TABS: 100.0000 mg | ORAL_TABLET | Freq: Every day | ORAL | 0 refills | Status: DC

## 2016-11-11 MED ORDER — MIRTAZAPINE 15 MG PO TABS
15.0000 mg | ORAL_TABLET | Freq: Every day | ORAL | Status: DC
  Administered 2016-11-11: 15 mg via ORAL
  Filled 2016-11-11 (×4): qty 1

## 2016-11-11 MED ORDER — ACETAMINOPHEN 325 MG PO TABS
650.0000 mg | ORAL_TABLET | ORAL | Status: DC | PRN
  Administered 2016-11-11: 650 mg via ORAL
  Filled 2016-11-11: qty 2

## 2016-11-11 MED ORDER — MULTI-VITAMIN/MINERALS PO TABS: 1.0000 | ORAL_TABLET | Freq: Every day | ORAL | 0 refills | Status: AC

## 2016-11-11 MED ORDER — GABAPENTIN 100 MG PO CAPS
100.0000 mg | ORAL_CAPSULE | Freq: Three times a day (TID) | ORAL | Status: DC
  Administered 2016-11-11 – 2016-11-14 (×9): 100 mg via ORAL
  Filled 2016-11-11 (×15): qty 1

## 2016-11-11 MED ORDER — FERROUS SULFATE 325 (65 FE) MG PO TABS
325.0000 mg | ORAL_TABLET | Freq: Every day | ORAL | Status: DC
  Administered 2016-11-12 – 2016-11-14 (×3): 325 mg via ORAL
  Filled 2016-11-11 (×5): qty 1

## 2016-11-11 MED ORDER — CYANOCOBALAMIN 1000 MCG PO TABS: 1000.0000 ug | ORAL_TABLET | Freq: Every day | ORAL | 0 refills | Status: AC

## 2016-11-11 MED ORDER — PREMIER PROTEIN SHAKE: 11.0000 [oz_av] | Freq: Two times a day (BID) | ORAL | 0 refills | Status: AC

## 2016-11-11 MED ORDER — ADULT MULTIVITAMIN W/MINERALS CH
1.0000 | ORAL_TABLET | Freq: Every day | ORAL | Status: DC
  Administered 2016-11-12 – 2016-11-14 (×3): 1 via ORAL
  Filled 2016-11-11 (×6): qty 1

## 2016-11-11 MED ORDER — ACETAMINOPHEN 325 MG PO TABS
650.0000 mg | ORAL_TABLET | Freq: Four times a day (QID) | ORAL | Status: DC | PRN
  Administered 2016-11-13: 650 mg via ORAL
  Filled 2016-11-11: qty 2

## 2016-11-11 MED ORDER — METHOCARBAMOL 750 MG PO TABS
750.0000 mg | ORAL_TABLET | Freq: Three times a day (TID) | ORAL | Status: DC | PRN
  Administered 2016-11-11 – 2016-11-13 (×5): 750 mg via ORAL
  Filled 2016-11-11 (×5): qty 1

## 2016-11-11 MED ORDER — CALCIUM CARBONATE-VITAMIN D 500-200 MG-UNIT PO TABS
2.0000 | ORAL_TABLET | Freq: Two times a day (BID) | ORAL | Status: DC
  Administered 2016-11-11 – 2016-11-14 (×6): 2 via ORAL
  Filled 2016-11-11 (×11): qty 2

## 2016-11-11 MED ORDER — GABAPENTIN 100 MG PO CAPS: 100.0000 mg | ORAL_CAPSULE | Freq: Three times a day (TID) | ORAL | 0 refills | Status: DC

## 2016-11-11 MED ORDER — MAGNESIUM HYDROXIDE 400 MG/5ML PO SUSP: 30.0000 mL | Freq: Every day | ORAL | Status: DC | PRN

## 2016-11-11 MED ORDER — ALUM & MAG HYDROXIDE-SIMETH 200-200-20 MG/5ML PO SUSP: 30.0000 mL | ORAL | Status: DC | PRN

## 2016-11-11 MED ORDER — CALCIUM CARBONATE-VITAMIN D 500-200 MG-UNIT PO TABS: 2.0000 | ORAL_TABLET | Freq: Two times a day (BID) | ORAL | 0 refills | Status: DC

## 2016-11-11 MED ORDER — VITAMIN B-12 1000 MCG PO TABS
1000.0000 ug | ORAL_TABLET | Freq: Every day | ORAL | Status: DC
  Administered 2016-11-12 – 2016-11-14 (×3): 1000 ug via ORAL
  Filled 2016-11-11 (×8): qty 1

## 2016-11-11 MED ORDER — METHOCARBAMOL 750 MG PO TABS: 750.0000 mg | ORAL_TABLET | Freq: Three times a day (TID) | ORAL | 0 refills | Status: DC | PRN

## 2016-11-11 MED ORDER — DIPHENHYDRAMINE HCL 50 MG PO CAPS
50.0000 mg | ORAL_CAPSULE | Freq: Once | ORAL | Status: AC
  Administered 2016-11-11: 50 mg via ORAL
  Filled 2016-11-11: qty 1

## 2016-11-11 NOTE — Progress Notes (Signed)
Per Arna MediciNora with Case Management she has put a list of resources for PCP on the patient's AVS.

## 2016-11-11 NOTE — Progress Notes (Signed)
Nutrition Follow-up  DOCUMENTATION CODES:   Morbid obesity, Non-severe (moderate) malnutrition in context of social or environmental circumstances  INTERVENTION:    Continue Premier Protein BID, each supplement provides 160kcal and 30g protein.   Continue MVI daily/calcium  NUTRITION DIAGNOSIS:   Moderate Malnutrition related to social / environmental circumstances(depression) as evidenced by percent weight loss, energy intake < 75% for > or equal to 3 months.  Ongoing  GOAL:   Patient will meet greater than or equal to 90% of their needs  Meeting  MONITOR:   PO intake, Supplement acceptance, Labs, Weight trends  REASON FOR ASSESSMENT:   Malnutrition Screening Tool    ASSESSMENT:   Pt with PMH significant for gastric bypass surgery 12 yr ago (non-compliant with vitamin/minerals), microcytic anemia, bipolar disease, DDD, and fibromyalgia. Presents this admission with suicidal ideations and anemia (most likely multifactorial, including nutrition deficiency s/p gastric bypass).   Pt eating breakfast upon RD visit. Pt eating bacon, eggs, and toast. Reports great toleration. Pt drinking Premier Protein BID, and would like to continue.  Meal completions charted as 100% average for his last eight meals.  Care management to see pt regarding bariatric vitamin/minerals post discharge as cost is an issue.  Copper level noted to be low. Started on Prosight which contains 2 mg of copper.  No weights obtained since admission weight.  Will continue with current interventions.  Will monitor labs closely.   Medications reviewed and include: ferrous sulfate, MVI, prenatal MVI, Vit B12,  Labs reviewed: ALP 151 (H) Copper 56 (L)   Diet Order:  Diet regular Room service appropriate? Yes; Fluid consistency: Thin  EDUCATION NEEDS:   Education needs have been addressed  Skin:  Skin Assessment: Reviewed RN Assessment  Last BM:  11/11/16  Height:   Ht Readings from Last 1  Encounters:  11/05/16 5\' 9"  (1.753 m)    Weight:   Wt Readings from Last 1 Encounters:  11/05/16 290 lb (131.5 kg)    Ideal Body Weight:  72.7 kg  BMI:  Body mass index is 42.83 kg/m.  Estimated Nutritional Needs:   Kcal:  1800-2100 kcal/kg  Protein:  100-110 g/day  Fluid:  >1.8 L/day    Vanessa Kickarly Cass Vandermeulen RD, LDN Clinical Nutrition Pager # - [phone]

## 2016-11-11 NOTE — Progress Notes (Signed)
D    Pt is pleasant on approach and cooperative   He complained of getting small doses of medications and we didn't have the type of  Protein drink that he needs [facility]   He did receive Ensure in its place A    Verbal support and encouragement given   Allowed for venting   Medications administered and effectiveness monitored    Q 15 min checks  R   Pt is safe at present
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
## 2016-11-11 NOTE — Progress Notes (Signed)
[facility].  All questions answered.

## 2016-11-11 NOTE — Progress Notes (Signed)
Discharge instructions and medications discussed with patient.  Prescription and AVS given to patient.  All questions answered.  

## 2016-11-11 NOTE — Discharge Summary (Signed)
Physician Discharge Summary  Zachary [name] Zachary [name] ZOX:[id number] DOB: [birth year] DOA: 11/04/2016  PCP: Dartha LodgeSteele, Anthony, FNP  Admit date: 11/04/2016 Discharge date: 11/11/2016  Time spent: over 30 minutes minutes  Recommendations for Outpatient Follow-up:  1. Ensure pt establishes with PCP 2. Needs outpatient CBC/CMP and f/u of copper, etc 3. Started on B12, calcium/vit D, MVI with minerals (has 2 mg copper), and iron at discharge.  Important that he continues supplementation with at least 2 mg copper daily.  Was given resources for MVI by bariatric nurse.  Suspect pancytopenia is related to micronutrient deficiencies and will need routine f/u monitoring with PCP, potentially hematology if persistent and not improving.   4. Needs f/u with outpatient psychiatry after Zachary [name]  Discharge Diagnoses:  Principal Problem:   Bipolar disorder, current episode depressed, severe, without psychotic features (HCC) Active Problems:   Posttraumatic stress disorder   Morbid obesity (HCC)   Benign essential HTN   Anemia   Suicidal ideation   Discharge Condition: stable  Diet recommendation: heart healthy  Filed Weights   11/05/16 0727  Weight: 131.5 kg (290 lb)    History of present illness:  H/o obesityBody mass index is 42.83 kg/m.s/p gastric bypass surgery6268yr ago. (He does not have primary care doctor, he does not take iron/folate/b12/vitamin supplement),history of microcytic anemia,h/o kidney stone,history of bipolar disease,history of fibromyalgia and degenerative disc disease,he self-ref[facility] to [facility] due to suicidal ideation.he is referred to ED due to no bed available.   ED course: vital signs are stable.Initial CBC hemoglobin 7.2,repeat CBC 6.3,no overt bleeding. He reports fatigue, denies sob, no chest pain, he reports chronic dizziness ,walks with two canes and use wheelchair at baseline.Patient declined rectal exam,hedenies of abdominal pain,denies of  blood in the stool or urine. Hospitalist called to admit the patient.    Hospital Course:  Suicidal ideation with suicide attempt (cutting wrist) -Continue sitter at bedside, psych consult requested -Suicide precautions -Currently medically stable for transfer to behavioral health  Severe Microcytic anemia  pancytopenia  iron deficiency Anemia  Copper Deficiency - Iron 19, Ferritin 2 - S/p transfusion of pRBC on 11/3, 11/4, and 11/6 - s/p feraheme on 11/4 - Today H/H is stable at 8.5 (6.3 on admission) - Hb in 8-9 range in 2016 - suspect primarily nutritional deficiencies due to gastric bypass and noncompliance with vitamins, minerals and iron supplementation for 10+years.  He notes cost is Zachary [name] major barrier, was given resources by bariatric surgery nurse. -no history of blood in stools or black stools no hematemesis reported either, declinedrectal exam, Hemoccult stool pending but do not think this is source  - Previous provider called and d/w Heme Dr.Ennever11/4due to concomitant leukopenia and thrombocytopenia, he recommended Iv Iron (given above), Supplemental B12 and check Copper level, he also agreed this is most likely progression of nutritional deficiencies following gastric bypass,Copper level (low) and Methylmalonic acid (normal) [ ]  consider outpatient oncology f/u if persistent pancytopenia  Started on adult MVI with minerals which has 2 mg copper  Posttraumatic stress disorder/bipolar disorder, depression -chronic pain and fibromyalgia -Continue Robaxin when necessary, trazodone -continue Remeron  Morbid obesity (HCC) -not an active issue  Benign essential HTN -stable, no longer taking lisinopril    Procedures:  none (i.Zachary. Studies not automatically included, echos, thoracentesis, etc; not x-rays)  Consultations:  psychiatry  Discharge Exam: Vitals:   11/10/16 2144 11/11/16 0619  BP: 128/62 137/66  Pulse: 77 66  Resp: 15 16  Temp: 98.7 F  (37.1 C) 97.7 F (36.5  C)  SpO2: 98% 97%   Frustrated.  [facility] to [facility]. Denies SI  General: No acute distress. Cardiovascular: Heart sounds show Zachary [name] regular rate, and rhythm. No gallops or rubs. No murmurs. No JVD. Lungs: Clear to auscultation bilaterally with good air movement. No rales, rhonchi or wheezes. Abdomen: Soft, nontender, nondistended with normal active bowel sounds. No masses. No hepatosplenomegaly. Neurological: Alert and oriented 3. Moves all extremities 4 with equal strength. Cranial nerves II through XII grossly intact. Skin: Warm and dry. No rashes or lesions. Extremities: No clubbing or cyanosis. No edema. Pedal pulses 2+. Psychiatric: Mood and affect are depressed. Insight and judgment are appropriate.  Discharge Instructions   Discharge Instructions    Diet - low sodium heart healthy   Complete by:  As directed    Diet - low sodium heart healthy   Complete by:  As directed    Discharge instructions   Complete by:  As directed    You were seen for suicidal ideation.  We are transferring you to the behavioral health hospital for further care and management.    You have low blood counts we believe due to chronic malnutrition.  The bariatric surgery nurse gave you resources for multivitamins once you are an outpatient.  Please follow up with your primary about these multivitamins.  It is extremely important that you take these.  Your copper level was low.  We have started you on supplementation here with the multivitamin, but this is something that will require follow up (possibly with hematology).  Once you establish with Zachary [name] primary care physician, please have them request records from Zachary [name] so they are up to date with your care and what needs to be done in follow up.   Establishing with outpatient psychiatry and primary care physician are going to be very important for managing your care.   Return if new or worsening symptoms.   Increase activity slowly    Complete by:  As directed    Increase activity slowly   Complete by:  As directed      Current Discharge Medication List    START taking these medications   Details  calcium-vitamin D (OSCAL 500/200 D-3) 500-200 MG-UNIT tablet Take 2 tablets 2 (two) times daily by mouth. Qty: 120 tablet, Refills: 0    cyanocobalamin 1000 MCG tablet Take 1 tablet (1,000 mcg total) daily by mouth. Qty: 30 tablet, Refills: 0    gabapentin (NEURONTIN) 100 MG capsule Take 1 capsule (100 mg total) 3 (three) times daily by mouth. Qty: 90 capsule, Refills: 0    Multiple Vitamins-Minerals (MULTIVITAMIN WITH MINERALS) tablet Take 1 tablet daily by mouth. Qty: 30 tablet, Refills: 0    protein supplement shake (PREMIER PROTEIN) LIQD Take 325 mLs (11 oz total) 2 (two) times daily between meals by mouth. Qty: 19500 mL, Refills: 0      CONTINUE these medications which have CHANGED   Details  methocarbamol (ROBAXIN) 750 MG tablet Take 1 tablet (750 mg total) every 8 (eight) hours as needed by mouth for muscle spasms. Qty: 120 tablet, Refills: 0      CONTINUE these medications which have NOT CHANGED   Details  naproxen sodium (ALEVE) 220 MG tablet Take 880 mg by mouth daily as needed (pain).     traZODone (DESYREL) 150 MG tablet Take 1 tablet (150 mg total) by mouth at bedtime. Qty: 30 tablet, Refills: 0    ferrous sulfate 325 (65 FE) MG tablet Take 1 tablet (  325 mg total) by mouth daily with breakfast. Refills: 3    mirtazapine (REMERON) 15 MG tablet Take 1 tablet (15 mg total) by mouth at bedtime. Qty: 30 tablet, Refills: 0      STOP taking these medications     DULoxetine (CYMBALTA) 30 MG capsule      lisinopril (PRINIVIL,ZESTRIL) 10 MG tablet      Oxcarbazepine (TRILEPTAL) 600 MG tablet      pregabalin (LYRICA) 25 MG capsule        Allergies  Allergen Reactions  . Capsaicin Other (See Comments)    Burning on skin - causes blisters immediately   . Codeine Itching  . Latex Itching and  Rash   Follow-up Information    [facility]. Call.   Contact information: [address] [place] [phone]       [facility] Follow up.   Specialty:  Internal Medicine Contact information: [address] [place] [id number] [phone]           The results of significant diagnostics from this hospitalization (including imaging, microbiology, ancillary and laboratory) are listed below for reference.    Significant Diagnostic Studies: No results found.  Microbiology: No results found for this or any previous visit (from the past 240 hour(s)).   Labs: Basic Metabolic Panel: Recent Labs  Lab 11/04/16 2247 11/06/16 0728 11/07/16 0606 11/10/16 0540  NA 139 142 144 143  K 3.9 4.0 4.1 3.9  CL 110 114* 115* 114*  CO2 23 23 24 23   GLUCOSE 94 89 87 95  BUN 13 16 19  23*  CREATININE 1.01 0.96 0.95 1.03  CALCIUM 8.0* 7.9* 8.0* 8.0*  MG  --  1.9  --   --    Liver Function Tests: Recent Labs  Lab 11/04/16 2247 11/07/16 0606  AST 18 13*  ALT 14* 10*  ALKPHOS 176* 151*  BILITOT 0.9 0.8  PROT 6.6 5.5*  ALBUMIN 3.3* 2.9*   No results for input(s): LIPASE, AMYLASE in the last 168 hours. No results for input(s): AMMONIA in the last 168 hours. CBC: Recent Labs  Lab 11/05/16 0650 11/06/16 0728 11/07/16 0606 11/07/16 0822 11/08/16 0814 11/09/16 0606 11/10/16 0540 11/11/16 0604  WBC 3.7* 3.3* 3.3* 3.7* 3.0* 3.1* 3.8*  --   NEUTROABS 2.3 2.2  --   --   --   --   --   --   HGB 6.3* 7.0* 7.2* 7.9* 7.2* 8.2* 8.4* 8.5*  HCT 23.1* 25.6* 25.9* 28.2* 25.2* 27.6* 29.7* 30.1*  MCV 65.8* 67.4* 68.0* 68.0* 68.9* 70.4* 71.6*  --   PLT 88* 108* 95* 129* 107* 104* 109*  --    Cardiac Enzymes: No results for input(s): CKTOTAL, CKMB, CKMBINDEX, TROPONINI in the last 168 hours. BNP: BNP (last 3 results) No results for input(s): BNP in the last 8760 hours.  ProBNP (last 3  results) No results for input(s): PROBNP in the last 8760 hours.  CBG: No results for input(s): GLUCAP in the last 168 hours.     Signed:  Lacretia Nicksaldwell Powell MD.  [facility] 11/11/2016, 2:08 PM
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
## 2016-11-11 NOTE — Progress Notes (Signed)
Report received from admitting nurse. Patient denies SI/HI/AVH and pain at this time. Orders received and acknowledged.  Medications administered as per order. Patient verbally contracts for safety on the unit and agrees to come to staff before acting on any self harm thoughts/feelings and other needs or concerns. 15 min checks initiated for safety and patient oriented to the unit and the unit schedule. 

## 2016-11-12 DIAGNOSIS — F332 Major depressive disorder, recurrent severe without psychotic features: Secondary | ICD-10-CM

## 2016-11-12 DIAGNOSIS — R45851 Suicidal ideations: Secondary | ICD-10-CM

## 2016-11-12 MED ORDER — PREMIER PROTEIN SHAKE
2.0000 [oz_av] | Freq: Four times a day (QID) | ORAL | Status: DC
  Administered 2016-11-12 – 2016-11-13 (×4): 2 [oz_av] via ORAL
  Filled 2016-11-12 (×12): qty 325.31

## 2016-11-12 MED ORDER — MIRTAZAPINE 15 MG PO TBDP
15.0000 mg | ORAL_TABLET | Freq: Every day | ORAL | Status: DC
  Administered 2016-11-12 – 2016-11-13 (×2): 15 mg via ORAL
  Filled 2016-11-12 (×5): qty 1

## 2016-11-12 MED ORDER — RISPERIDONE 0.5 MG PO TBDP
0.5000 mg | ORAL_TABLET | Freq: Every day | ORAL | Status: DC
  Administered 2016-11-12: 0.5 mg via ORAL
  Filled 2016-11-12 (×4): qty 1

## 2016-11-12 NOTE — Progress Notes (Signed)
Pt attended evening wrap up group and stated today was a 6 or 7 better throughout the day because he was able to see his girlfriend and roommate for his birthday (today).

## 2016-11-12 NOTE — Progress Notes (Signed)
Data. Patient denies HI/AVH.  Does endorse SI with no plan at this time. Verbally contracts for safety on the unit and to come to staff before acting of any self harm thoughts/feelings/voices. Patient isolating in his room for most of the shift, reporting, "I am in constant pain and the stupid doctors won't listen to me and get me the medications I need." Affect is alternately irritable, angry and pleasant. On his self assessment he reports 4/10 for anxiety, depression and hopelessness. His goal for today is, "Don't know." Patient requested a protein shake, "I have has gastric bypass and I can't always eat much. The doctors at the ED said I needed it to help with my nourishment." Shake ordered. Action. Emotional support and encouragement offered. Education provided on medication, indications and side effect. Q 15 minute checks done for safety. Response. Safety on the unit maintained through 15 minute checks.  Medications taken as prescribed.

## 2016-11-12 NOTE — Progress Notes (Signed)
Nursing Progress Note: 7p-7a D: Pt currently presents with a pleasant/anxious affect and behavior. Pt states "Today, [birth year] my birthday. I really don't want to be here, but I am going to try to give this my best shot." Interacting appropriately with the milieu. Pt reports good sleep during the previous night with current medication regimen. Pt did attend wrap-up group.  A: Pt provided with medications per providers orders. Pt's labs and vitals were monitored throughout the night. Pt supported emotionally and encouraged to express concerns and questions. Pt educated on medications.  R: Pt's safety ensured with 15 minute and environmental checks. Pt currently denies SI, HI, and AVH. Pt verbally contracts to seek staff if SI,HI, or AVH occurs and to consult with staff before acting on any harmful thoughts. Will continue to monitor.
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
## 2016-11-12 NOTE — Progress Notes (Signed)
Pt attended wrap up group and stated that his day was about a [facility]!

## 2016-11-12 NOTE — [facility] Group Notes (Signed)
[facility] Group Notes: (Clinical Social Work)   11/12/2016      Type of Therapy:  Group Therapy   Participation Level:  Did Not Attend despite MHT prompting   Ambrose MantleMareida Grossman-Orr, LCSW 11/12/2016, 12:12 PM
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
## 2016-11-12 NOTE — Plan of Care (Signed)
  Progressing Safety: Periods of time without injury will increase 11/12/2016 1638 - Progressing by Almira Bararter, Shenique Childers G, RN Note No injuries noted or reported this shift. Coping: Ability to verbalize frustrations and anger appropriately will improve 11/12/2016 1638 - Progressing by Almira Bararter, Graeme Menees G, RN Note Patient has been able to express his feelings appropriately this shift.   Not Progressing Activity: Interest or engagement in activities will improve 11/12/2016 1638 - Not Progressing by Almira Bararter, Tywana Robotham G, RN Note Patient has remain mostly isolated in his room this shift. Sleeping patterns will improve 11/12/2016 1638 - Not Progressing by Almira Bararter, Glema Takaki G, RN Note Patient reports poor sleep and did nap much of the day.

## 2016-11-13 DIAGNOSIS — F39 Unspecified mood [affective] disorder: Secondary | ICD-10-CM

## 2016-11-13 DIAGNOSIS — M1990 Unspecified osteoarthritis, unspecified site: Secondary | ICD-10-CM | POA: Diagnosis present

## 2016-11-13 DIAGNOSIS — R450 Nervousness: Secondary | ICD-10-CM

## 2016-11-13 DIAGNOSIS — G894 Chronic pain syndrome: Secondary | ICD-10-CM | POA: Diagnosis present

## 2016-11-13 DIAGNOSIS — F419 Anxiety disorder, unspecified: Secondary | ICD-10-CM

## 2016-11-13 MED ORDER — RISPERIDONE 1 MG PO TBDP
1.0000 mg | ORAL_TABLET | Freq: Every day | ORAL | Status: DC
  Administered 2016-11-13: 1 mg via ORAL
  Filled 2016-11-13 (×4): qty 1

## 2016-11-13 MED ORDER — BOOST / RESOURCE BREEZE PO LIQD
1.0000 | Freq: Three times a day (TID) | ORAL | Status: DC
  Administered 2016-11-13: 237 mL via ORAL
  Administered 2016-11-13 – 2016-11-14 (×2): 1 via ORAL
  Filled 2016-11-13 (×9): qty 1

## 2016-11-13 MED ORDER — MELOXICAM 7.5 MG PO TABS
7.5000 mg | ORAL_TABLET | Freq: Two times a day (BID) | ORAL | Status: DC
  Administered 2016-11-13 – 2016-11-14 (×3): 7.5 mg via ORAL
  Filled 2016-11-13 (×9): qty 1

## 2016-11-13 NOTE — Progress Notes (Signed)
Adult Psychoeducational Group Note  Date:  11/13/2016 Time:  11:09 PM  Group Topic/Focus:  Wrap-Up Group:   The focus of this group is to help patients review their daily goal of treatment and discuss progress on daily workbooks.  Participation Level:  Active  Participation Quality:  Appropriate and Attentive  Affect:  Flat  Cognitive:  Alert  Insight: Good  Engagement in Group:  Engaged  Modes of Intervention:  Discussion, Education and Support  Additional Comments:  Pt rated his day at a 5 out of 10. Reported he has been hypersensitive all day and woke up in pain. Pt's goal was to not lash out at anyone.   [name], Zachary [name] S 11/13/2016, 11:09 PM

## 2016-11-13 NOTE — Progress Notes (Signed)
Data. Patient denies SI/HI/AVH. Verbally contracts for safety on the unit and to come to staff before acting of any self harm thoughts/feelings.  Patient interacting well with staff and other patients. Affect is much brighter this shift and his mood is brighter also. Patient has been in the day room in the milieu most of the shift today. Reports, "I am still in pain, but my thoughts are starting to get better."Patient did not complete a self inventory. Action. Emotional support and encouragement offered. Education provided on medication, indications and side effect. Q 15 minute checks done for safety. Response. Safety on the unit maintained through 15 minute checks.  Medications taken as prescribed. Attended groups. Remained calm and appropriate through out shift.

## 2016-11-13 NOTE — [facility] Group Notes (Signed)
[facility] LCSW Group Therapy Note  Date/Time:  11/13/2016 10:00-11:00AM  Type of Therapy and Topic:  Group Therapy:  Healthy and Unhealthy Supports  Participation Level:  Did Not Attend   Description of Group:  Patients in this group were introduced to the idea of adding a variety of healthy supports to address the various needs in their lives. The picture on the front of Sunday's workbook was used to demonstrate why more supports are needed in every patient's life.  Patients identified and described healthy supports versus unhealthy supports in general, then gave examples of each in their own lives.   They discussed what additional healthy supports could be helpful in their recovery and wellness after discharge in order to prevent future hospitalizations.   An emphasis was placed on using counselor, doctor, therapy groups, 12-step groups, and problem-specific support groups to expand supports.  They also worked as a group on developing a specific plan for several patients to deal with unhealthy supports through boundary-setting, psychoeducation with loved ones, and even termination of relationships.   Therapeutic Goals:   1)  discuss importance of adding supports to stay well once out of the hospital  2)  compare healthy versus unhealthy supports and identify some examples of each  3)  generate ideas and descriptions of healthy supports that can be added  4)  offer mutual support about how to address unhealthy supports  5)  encourage active participation in and adherence to discharge plan    Summary of Patient Progress:  N/A   Therapeutic Modalities:   Motivational Interviewing Brief Solution-Focused Therapy  Ambrose MantleMareida Grossman-Orr, LCSW 11/13/2016, 11:00AM

## 2016-11-13 NOTE — Progress Notes (Signed)
D    Pt is pleasant on approach  He looks calmer and less depressed    He does admit some improvement but still complains of not getting enough protein in his diet   He attends and participates in groups   Pt is taking care of his appearance better and is using the rolling walker instead of the wheelchair   He does complain of pain as well A    Verbal support given    Medications administered and effectiveness monitored   Q 15 min checks    Discussed diet and recommended him to get extra meat with his meals  R   Pt is safe at present time and receptive to verbal support

## 2016-11-13 NOTE — [facility] Suicide Risk Assessment (Signed)
[facility] INPATIENT:  Family/Significant Other Suicide Prevention Education  Suicide Prevention Education:  Education Completed; Significant other Julious OkaChrista Sunderland  [phone] has been identified by the patient as the family member/significant other with whom the patient will be residing, and identified as the person(s) who will aid the patient in the event of a mental health crisis (suicidal ideations/suicide attempt).  With written consent from the patient, the family member/significant other has been provided the following suicide prevention education, prior to the and/or following the discharge of the patient.  The suicide prevention education provided includes the following:  Suicide risk factors  Suicide prevention and interventions  National Suicide Hotline telephone number  [facility] assessment telephone number  [facility] Assistance 911  [facility] and/or [facility] telephone number  Request made of family/significant other to:  Remove weapons (e.g., guns, rifles, knives), all items previously/currently identified as safety concern.    Remove drugs/medications (over-the-counter, prescriptions, illicit drugs), all items previously/currently identified as a safety concern.  The family member/significant other verbalizes understanding of the suicide prevention education information provided.  The family member/significant other agrees to remove the items of safety concern listed above.  Carloyn JaegerMareida J Grossman-Orr 11/13/2016, 5:07 PM

## 2016-11-13 NOTE — Progress Notes (Signed)
Nutrition Brief Note  Patient identified on the Malnutrition Screening Tool (MST) Report  Patient with history of gastric bypass x 12 years ago. [facility], was followed by nutrition team. Pt was provided Premier Protein supplements. Appropriate vitamin [facility].  [facility]  Wt Readings from Last 15 Encounters:  11/11/16 300 lb (136.1 kg)  11/05/16 290 lb (131.5 kg)  08/28/14 (!) 326 lb (147.9 kg)  06/11/14 (!) 340 lb (154.2 kg)  01/01/13 (!) 378 lb (171.5 kg)    Body mass index is 44.3 kg/m. Patient meets criteria for obesity based on current BMI.  Labs and medications reviewed.   No nutrition interventions warranted at this time. If nutrition issues arise, please consult RD.   Tilda FrancoLindsey Juliana Boling, MS, RD, LDN [facility] Clinical Dietitian Pager: [phone] After Hours Pager: [phone]

## 2016-11-13 NOTE — Progress Notes (Signed)
[facility]  11/13/2016 1:21 PM Zachary [name]  MRN:  [id number]   Subjective:  Patient reports that he feeling some better today. He rates his depression at 4/10 and his anxiety at 4/10. He denies any SI/HI/VH, but reports VH saying his name and contracts for safety. The patient states that one of his biggest issues is continued pain, but he has no insurance and can't afford a pain clinic. He reports not wanting controlled substances, but Neurontin and tylenol do not fix his pain.   Objective: Patient's chart and findings reviewed and discussed with treatment team. patient is pleasant and cooperative. He has continued complaint of chronic pain and osteoarthritis. Will prescribe Meloxicam 7.5 mg BID for pain assistance.  Principal Problem: MDD (major depressive disorder) Diagnosis:   Patient Active Problem List   Diagnosis Date Noted  . Osteoarthritis [M19.90] 11/13/2016  . Chronic pain syndrome [G89.4] 11/13/2016  . MDD (major depressive disorder) [F32.9] 11/11/2016  . Suicidal ideation [R45.851] 11/06/2016  . Anemia [D64.9] 11/05/2016  . Bipolar disorder, current episode depressed, severe, without psychotic features (HCC) [F31.4] 08/29/2014  . Morbid obesity (HCC) [E66.01] 08/29/2014  . Hypocalcemia [E83.51]   . AKI (acute kidney injury) (HCC) [N17.9]   . Benign essential HTN [I10]   . GAD (generalized anxiety disorder) [F41.1] 01/01/2013  . Posttraumatic stress disorder [F43.10] 01/01/2013   Total Time spent with patient: 25 minutes  Past Psychiatric History: See H&P  Past Medical History:  Past Medical History:  Diagnosis Date  . Anxiety   . Arthritis   . Bipolar 1 disorder, depressed (HCC)   . Fibromyalgia   . Hx of degenerative disc disease   . Obesity   . PTSD (post-traumatic stress disorder)     Past Surgical History:  Procedure Laterality Date  . ADENOIDECTOMY    . APPENDECTOMY    . CHOLECYSTECTOMY    . GASTRIC BYPASS    . TYMPANOSTOMY TUBE PLACEMENT      Family History:  Family History  Problem Relation Age of Onset  . Diabetes Father   . Cancer Father    Family Psychiatric  History: See H&P Social History:  Social History   Substance and Sexual Activity  Alcohol Use No   Comment: none     Social History   Substance and Sexual Activity  Drug Use No    Social History   Socioeconomic History  . Marital status: Single    Spouse name: None  . Number of children: None  . Years of education: None  . Highest education level: None  Social Needs  . Financial resource strain: None  . Food insecurity - worry: None  . Food insecurity - inability: None  . Transportation needs - medical: None  . Transportation needs - non-medical: None  Occupational History  . None  Tobacco Use  . Smoking status: Never Smoker  . Smokeless tobacco: Never Used  Substance and Sexual Activity  . Alcohol use: No    Comment: none  . Drug use: No  . Sexual activity: Yes    Birth control/protection: Condom  Other Topics Concern  . None  Social History Narrative  . None   Additional Social History:                         Sleep: Good  Appetite:  Good  Current Medications: Current Facility-Administered Medications  Medication Dose Route Frequency Provider Last Rate Last Dose  . acetaminophen (TYLENOL) tablet  650 mg  650 mg Oral Q6H PRN Rankin, Shuvon B, NP   650 mg at 11/13/16 0521  . alum & mag hydroxide-simeth (MAALOX/MYLANTA) 200-200-20 MG/5ML suspension 30 mL  30 mL Oral Q4H PRN Rankin, Shuvon B, NP      . calcium-vitamin D (OSCAL WITH D) 500-200 MG-UNIT per tablet 2 tablet  2 tablet Oral BID Rankin, Shuvon B, NP   2 tablet at 11/13/16 1134  . feeding supplement (BOOST / RESOURCE BREEZE) liquid 1 Container  1 Container Oral TID BM Cobos, Fernando A, MD      . ferrous sulfate tablet 325 mg  325 mg Oral Q breakfast Rankin, Shuvon B, NP   325 mg at 11/13/16 1136  . gabapentin (NEURONTIN) capsule 100 mg  100 mg Oral TID Rankin,  Shuvon B, NP   100 mg at 11/13/16 1137  . magnesium hydroxide (MILK OF MAGNESIA) suspension 30 mL  30 mL Oral Daily PRN Rankin, Shuvon B, NP      . meloxicam (MOBIC) tablet 7.5 mg  7.5 mg Oral BID La Dibella, Feliz Beam B, FNP      . methocarbamol (ROBAXIN) tablet 750 mg  750 mg Oral Q8H PRN Rankin, Shuvon B, NP   750 mg at 11/13/16 0521  . mirtazapine (REMERON SOL-TAB) disintegrating tablet 15 mg  15 mg Oral QHS Izediuno, Delight Ovens, MD   15 mg at 11/12/16 2142  . multivitamin with minerals tablet 1 tablet  1 tablet Oral Daily Rankin, Shuvon B, NP   1 tablet at 11/13/16 1133  . risperiDONE (RISPERDAL M-TABS) disintegrating tablet 1 mg  1 mg Oral QHS Velena Keegan, Gerlene Burdock, FNP      . vitamin B-12 (CYANOCOBALAMIN) tablet 1,000 mcg  1,000 mcg Oral Daily Rankin, Shuvon B, NP   1,000 mcg at 11/13/16 1134    Lab Results: No results found for this or any previous visit (from the past 48 hour(s)).  Blood Alcohol level:  Lab Results  Component Value Date   ETH <10 11/04/2016   ETH <5 08/26/2014    Metabolic Disorder Labs: No results found for: HGBA1C, MPG No results found for: PROLACTIN No results found for: CHOL, TRIG, HDL, CHOLHDL, VLDL, LDLCALC  Physical Findings: AIMS: Facial and Oral Movements Muscles of Facial Expression: None, normal Lips and Perioral Area: None, normal Jaw: None, normal Tongue: None, normal,Extremity Movements Upper (arms, wrists, hands, fingers): None, normal Lower (legs, knees, ankles, toes): None, normal, Trunk Movements Neck, shoulders, hips: None, normal, Overall Severity Severity of abnormal movements (highest score from questions above): None, normal Incapacitation due to abnormal movements: None, normal Patient's awareness of abnormal movements (rate only patient's report): No Awareness, Dental Status Current problems with teeth and/or dentures?: Yes Does patient usually wear dentures?: No  CIWA:    COWS:     Musculoskeletal: Strength & Muscle Tone: within normal  limits Gait & Station: normal Patient leans: N/A  Psychiatric Specialty Exam: Physical Exam  Nursing note and vitals reviewed. Constitutional: He is oriented to person, place, and time. He appears well-developed and well-nourished.  Cardiovascular: Normal rate.  Respiratory: Effort normal.  Musculoskeletal: Normal range of motion.  Neurological: He is alert and oriented to person, place, and time.  Skin: Skin is warm.    Review of Systems  Constitutional: Negative.   HENT: Negative.   Eyes: Negative.   Respiratory: Negative.   Cardiovascular: Negative.   Gastrointestinal: Negative.   Genitourinary: Negative.   Musculoskeletal: Negative.   Skin: Negative.   Neurological: Negative.  Endo/Heme/Allergies: Negative.   Psychiatric/Behavioral: Positive for depression. Negative for hallucinations and suicidal ideas. The patient is nervous/anxious.     Blood pressure 127/68, pulse 70, temperature 97.7 F (36.5 C), temperature source Oral, resp. rate 16, height 5\' 9"  (1.753 m), weight 136.1 kg (300 lb), SpO2 98 %.Body mass index is 44.3 kg/m.  General Appearance: Casual  Eye Contact:  Fair  Speech:  Clear and Coherent and Normal Rate  Volume:  Normal  Mood:  Depressed  Affect:  Flat  Thought Process:  Goal Directed and Descriptions of Associations: Intact  Orientation:  Full (Time, Place, and Person)  Thought Content:  Hallucinations: Auditory  Suicidal Thoughts:  No  Homicidal Thoughts:  No  Memory:  Immediate;   Good Recent;   Good Remote;   Good  Judgement:  Fair  Insight:  Good  Psychomotor Activity:  Normal  Concentration:  Concentration: Good and Attention Span: Good  Recall:  Good  Fund of Knowledge:  Good  Language:  Good  Akathisia:  No  Handed:  Right  AIMS (if indicated):     Assets:  Communication Skills Desire for Improvement Financial Resources/Insurance Housing Physical Health Social Support Transportation  ADL's:  Intact  Cognition:  WNL  Sleep:   Number of Hours: 4   Problems Addressed: GAD MDD Severe Osteoarthritis Chronic Pain Syndrome  Treatment Plan Summary: Daily contact with patient to assess and evaluate symptoms and progress in treatment, Medication management and Plan is to:  -Start Meloxicam 7.5 mg PO BID for pain and osteoarthritis -Increase Risperidone 1 mg PO QHS for mood stability -Continue Mirtazapine 15 mg PO QHS for mood stability -Continue Gabapentin 100 mg PO TID for neuropathic pain -Encourage group therapy participation   Maryfrances Bunnellravis B Patric Buckhalter, FNP 11/13/2016, 1:21 PM

## 2016-11-14 MED ORDER — METHOCARBAMOL 750 MG PO TABS: 750.0000 mg | ORAL_TABLET | Freq: Three times a day (TID) | ORAL | 0 refills | Status: DC | PRN

## 2016-11-14 MED ORDER — MIRTAZAPINE 15 MG PO TBDP: 15.0000 mg | ORAL_TABLET | Freq: Every day | ORAL | 0 refills | Status: DC

## 2016-11-14 MED ORDER — RISPERIDONE 1 MG PO TBDP: 1.0000 mg | ORAL_TABLET | Freq: Every day | ORAL | 0 refills | Status: DC

## 2016-11-14 MED ORDER — GABAPENTIN 100 MG PO CAPS: 100.0000 mg | ORAL_CAPSULE | Freq: Three times a day (TID) | ORAL | 0 refills | Status: DC

## 2016-11-14 NOTE — Progress Notes (Signed)
Patient verbalizes readiness for discharge. Mood much improved now that he is discharging. "I am so happy to be going home where I can better manage my pain. I also have my girlfriend's support." Follow up plan explained, AVS, transition record and SRA given along with prescriptions. Sample meds provided. All belongings returned. Patient verbalizes understanding. Denies SI/HI and assures this Clinical research associatewriter he will seek assistance should that change. Patient discharged ambulatory and in stable condition. [facility] and assisted with [facility] his belongings.

## 2016-11-14 NOTE — Tx Team (Signed)
Interdisciplinary Treatment and Diagnostic Plan Update 11/14/2016 Time of Session: 9:30am  Zachary [name]  MRN: [id number]  Principal Diagnosis: MDD (major depressive disorder)  Secondary Diagnoses: Principal Problem:   MDD (major depressive disorder) Active Problems:   GAD (generalized anxiety disorder)   Osteoarthritis   Chronic pain syndrome   Current Medications:  Current Facility-Administered Medications  Medication Dose Route Frequency Provider Last Rate Last Dose  . acetaminophen (TYLENOL) tablet 650 mg  650 mg Oral Q6H PRN Rankin, Shuvon B, NP   650 mg at 11/13/16 0521  . alum & mag hydroxide-simeth (MAALOX/MYLANTA) 200-200-20 MG/5ML suspension 30 mL  30 mL Oral Q4H PRN Rankin, Shuvon B, NP      . calcium-vitamin D (OSCAL WITH D) 500-200 MG-UNIT per tablet 2 tablet  2 tablet Oral BID Rankin, Shuvon B, NP   2 tablet at 11/14/16 0808  . feeding supplement (BOOST / RESOURCE BREEZE) liquid 1 Container  1 Container Oral TID BM Cobos, Rockey Situ, MD   1 Container at 11/14/16 [phone]  . ferrous sulfate tablet 325 mg  325 mg Oral Q breakfast Rankin, Shuvon B, NP   325 mg at 11/14/16 0809  . gabapentin (NEURONTIN) capsule 100 mg  100 mg Oral TID Rankin, Shuvon B, NP   100 mg at 11/14/16 1157  . magnesium hydroxide (MILK OF MAGNESIA) suspension 30 mL  30 mL Oral Daily PRN Rankin, Shuvon B, NP      . meloxicam (MOBIC) tablet 7.5 mg  7.5 mg Oral BID Money, Feliz Beam B, FNP   7.5 mg at 11/14/16 6213  . methocarbamol (ROBAXIN) tablet 750 mg  750 mg Oral Q8H PRN Rankin, Shuvon B, NP   750 mg at 11/13/16 2304  . mirtazapine (REMERON SOL-TAB) disintegrating tablet 15 mg  15 mg Oral QHS Izediuno, Delight Ovens, MD   15 mg at 11/13/16 2304  . multivitamin with minerals tablet 1 tablet  1 tablet Oral Daily Rankin, Shuvon B, NP   1 tablet at 11/14/16 0808  . risperiDONE (RISPERDAL M-TABS) disintegrating tablet 1 mg  1 mg Oral QHS Money, Gerlene Burdock, FNP   1 mg at 11/13/16 2304  . vitamin B-12  (CYANOCOBALAMIN) tablet 1,000 mcg  1,000 mcg Oral Daily Rankin, Shuvon B, NP   1,000 mcg at 11/14/16 0809    PTA Medications: Medications Prior to Admission  Medication Sig Dispense Refill Last Dose  . calcium-vitamin D (OSCAL 500/200 D-3) 500-200 MG-UNIT tablet Take 2 tablets 2 (two) times daily by mouth. 120 tablet 0   . cyanocobalamin 1000 MCG tablet Take 1 tablet (1,000 mcg total) daily by mouth. 30 tablet 0   . ferrous sulfate 325 (65 FE) MG tablet Take 1 tablet (325 mg total) by mouth daily with breakfast. (Patient not taking: Reported on 11/04/2016)  3 Not Taking at Unknown time  . gabapentin (NEURONTIN) 100 MG capsule Take 1 capsule (100 mg total) 3 (three) times daily by mouth. 90 capsule 0   . methocarbamol (ROBAXIN) 750 MG tablet Take 1 tablet (750 mg total) every 8 (eight) hours as needed by mouth for muscle spasms. 120 tablet 0   . mirtazapine (REMERON) 15 MG tablet Take 1 tablet (15 mg total) by mouth at bedtime. 30 tablet 0   . Multiple Vitamins-Minerals (MULTIVITAMIN WITH MINERALS) tablet Take 1 tablet daily by mouth. 30 tablet 0   . naproxen sodium (ALEVE) 220 MG tablet Take 880 mg by mouth daily as needed (pain).    Past Week at Unknown time  .  protein supplement shake (PREMIER PROTEIN) LIQD Take 325 mLs (11 oz total) 2 (two) times daily between meals by mouth. 19500 mL 0   . traZODone (DESYREL) 150 MG tablet Take 1 tablet (150 mg total) by mouth at bedtime. (Patient taking differently: Take 100 mg by mouth at bedtime. ) 30 tablet 0 Past Month at Unknown time    Treatment Modalities: Medication Management, Group therapy, Case management,  1 to 1 session with clinician, Psychoeducation, Recreational therapy.  Patient Stressors: Health problems Marital or family conflict Traumatic event Other: Inability to get disability assistance Patient Strengths: Ability for insight Average or above average intelligence Communication skills General fund of knowledge Motivation for  treatment/growth  Physician Treatment Plan for Primary Diagnosis: MDD (major depressive disorder) Long Term Goal(s): Improvement in symptoms so as ready for discharge Short Term Goals: Ability to identify changes in lifestyle to reduce recurrence of condition will improve Ability to verbalize feelings will improve Ability to disclose and discuss suicidal ideas Ability to demonstrate self-control will improve Ability to identify and develop effective coping behaviors will improve Ability to maintain clinical measurements within normal limits will improve Compliance with prescribed medications will improve Ability to identify changes in lifestyle to reduce recurrence of condition will improve Ability to verbalize feelings will improve Ability to disclose and discuss suicidal ideas Ability to demonstrate self-control will improve Ability to identify and develop effective coping behaviors will improve Ability to maintain clinical measurements within normal limits will improve Compliance with prescribed medications will improve  Medication Management: Evaluate patient's response, side effects, and tolerance of medication regimen.  Therapeutic Interventions: 1 to 1 sessions, Unit Group sessions and Medication administration.  Evaluation of Outcomes: Progressing  Physician Treatment Plan for Secondary Diagnosis: Principal Problem:   MDD (major depressive disorder) Active Problems:   GAD (generalized anxiety disorder)   Osteoarthritis   Chronic pain syndrome  Long Term Goal(s): Improvement in symptoms so as ready for discharge  Short Term Goals: Ability to identify changes in lifestyle to reduce recurrence of condition will improve Ability to verbalize feelings will improve Ability to disclose and discuss suicidal ideas Ability to demonstrate self-control will improve Ability to identify and develop effective coping behaviors will improve Ability to maintain clinical measurements  within normal limits will improve Compliance with prescribed medications will improve Ability to identify changes in lifestyle to reduce recurrence of condition will improve Ability to verbalize feelings will improve Ability to disclose and discuss suicidal ideas Ability to demonstrate self-control will improve Ability to identify and develop effective coping behaviors will improve Ability to maintain clinical measurements within normal limits will improve Compliance with prescribed medications will improve  Medication Management: Evaluate patient's response, side effects, and tolerance of medication regimen.  Therapeutic Interventions: 1 to 1 sessions, Unit Group sessions and Medication administration.  Evaluation of Outcomes: Progressing  RN Treatment Plan for Primary Diagnosis: MDD (major depressive disorder) Long Term Goal(s): Knowledge of disease and therapeutic regimen to maintain health will improve  Short Term Goals: Compliance with prescribed medications will improve  Medication Management: RN will administer medications as ordered by provider, will assess and evaluate patient's response and provide education to patient for prescribed medication. RN will report any adverse and/or side effects to prescribing provider.  Therapeutic Interventions: 1 on 1 counseling sessions, Psychoeducation, Medication administration, Evaluate responses to treatment, Monitor vital signs and CBGs as ordered, Perform/monitor CIWA, COWS, AIMS and Fall Risk screenings as ordered, Perform wound care treatments as ordered.  Evaluation of Outcomes: Progressing  LCSW Treatment Plan for Primary Diagnosis: MDD (major depressive disorder) Long Term Goal(s): Safe transition to appropriate next level of care at discharge, Engage patient in therapeutic group addressing interpersonal concerns. Short Term Goals: Engage patient in aftercare planning with referrals and resources, Increase ability to appropriately  verbalize feelings, Increase emotional regulation, Identify triggers associated with mental health/substance abuse issues and Increase skills for wellness and recovery  Therapeutic Interventions: Assess for all discharge needs, 1 to 1 time with Social worker, Explore available resources and support systems, Assess for adequacy in community support network, Educate family and significant other(s) on suicide prevention, Complete Psychosocial Assessment, Interpersonal group therapy.  Evaluation of Outcomes: Progressing  Progress in Treatment: Attending groups:  No Participating in groups: No  Taking medication as prescribed: Yes, MD continues to assess for medication changes as needed Toleration medication: Yes, no side effects reported at this time Family/Significant other contact made: Yes, pt's girlfriend contacted. Patient understands diagnosis: Developing insight  Discussing patient identified problems/goals with staff: Yes Medical problems stabilized or resolved: Yes Denies suicidal/homicidal ideation: Yes  Issues/concerns per patient self-inventory: None Other: N/A  New problem(s) identified: None identified at this time.   New Short Term/Long Term Goal(s): None identified at this time.   Discharge Plan or Barriers: Pt will return home and follow up with an outpatient provider.  Reason for Continuation of Hospitalization:  Anxiety  Depression Medication stabilization  Estimated Length of Stay: 1-3 days; Estimated discharge date 11/17/16  Attendees: Patient: 11/14/2016 12:54 PM  Physician: Dr. Jackquline BerlinIzediuno 11/14/2016 12:54 PM  Nursing: Tiburcio Peahrista, RN; Caroline, RN 11/14/2016 12:54 PM  RN Care Manager:  11/14/2016 12:54 PM  Social Worker: Donnelly StagerLynn Chinara Hertzberg, LCSWA 11/14/2016 12:54 PM  Recreational Therapist:  11/14/2016 12:54 PM  Other: Feliz Beamravis, NP; Alcario Droughtanika, NP 11/14/2016 12:54 PM  Other:  11/14/2016 12:54 PM  Other: 11/14/2016 12:54 PM  Scribe for Treatment Team: Jonathon JordanLynn B Rendi Mapel,  MSW,LCSWA 11/14/2016 12:54 PM

## 2016-11-14 NOTE — Progress Notes (Signed)
Recreation Therapy Notes  Date: 11/14/16 Time: 0930 Location: [address] Dayroom  Group Topic: Stress Management  Goal Area(s) Addresses:  Patient will verbalize importance of using healthy stress management.  Patient will identify positive emotions associated with healthy stress management.   Intervention: Stress Management  Activity :  Meditation.  LRT introduced the stress management technique of meditation.  LRT played a meditation from the Calm app that allowed patients to focus on the things they are grateful for.  Education:  Stress Management, Discharge Planning.   Education Outcome: Acknowledges edcuation/In group clarification offered/Needs additional education  Clinical Observations/Feedback: Pt did not attend group.    Caroll RancherMarjette Deiondre Harrower, LRT/CTRS         Caroll RancherLindsay, Rosemarie Galvis A 11/14/2016 12:22 PM

## 2016-11-14 NOTE — Progress Notes (Signed)
  [facility] Discharge [facility] :  Will you be returning to the same living situation after discharge:  Yes,  pt returning home. At discharge, do you have transportation home?: Yes,  pt has access to transportation. Do you have the ability to pay for your medications: Yes,  prescriptions and samples provided.  Release of information consent forms completed and in the chart;  Patient's signature needed at discharge.  Patient to Follow up at: Follow-up Information    [facility] Follow up on 11/16/2016.   Why:  Therapy appointment 11/14 at 2pm with Dorinda Hillonald. Medication managment 11/20 at 2:30pm with Dr. Melvyn NethLewis.  Contact information: [address] [place], [place] [address] P: [phone] F: [phone]          Next level of care provider has access to [facility] Link:no  Safety Planning and Suicide Prevention discussed: Yes,  with pt and with pt's girlfriend.  Have you used any form of tobacco in the last 30 days? (Cigarettes, Smokeless Tobacco, Cigars, and/or Pipes): No  Has patient been referred to the Quitline?: N/A patient is not a smoker  Patient has been referred for addiction treatment: N/A  Jonathon JordanLynn B Makita Blow, MSW, LCSWA 11/14/2016, 3:48 PM

## 2016-11-14 NOTE — [facility] Suicide Risk Assessment (Signed)
[facility]   Principal Problem: MDD (major depressive disorder) Discharge Diagnoses:  Patient Active Problem List   Diagnosis Date Noted  . Osteoarthritis [M19.90] 11/13/2016  . Chronic pain syndrome [G89.4] 11/13/2016  . MDD (major depressive disorder) [F32.9] 11/11/2016  . Suicidal ideation [R45.851] 11/06/2016  . Anemia [D64.9] 11/05/2016  . Bipolar disorder, current episode depressed, severe, without psychotic features (HCC) [F31.4] 08/29/2014  . Morbid obesity (HCC) [E66.01] 08/29/2014  . Hypocalcemia [E83.51]   . AKI (acute kidney injury) (HCC) [N17.9]   . Benign essential HTN [I10]   . GAD (generalized anxiety disorder) [F41.1] 01/01/2013  . Posttraumatic stress disorder [F43.10] 01/01/2013    Total Time spent with patient: 30 minutes  Musculoskeletal: Strength & Muscle Tone: within normal limits Gait & Station: normal Patient leans: N/A  Psychiatric Specialty Exam: Review of Systems  Constitutional: Negative.   HENT: Negative.   Eyes: Negative.   Respiratory: Negative.   Cardiovascular: Negative.   Gastrointestinal: Negative.   Genitourinary: Negative.   Musculoskeletal: Positive for back pain, joint pain and myalgias.  Skin: Negative.   Neurological: Negative.   Endo/Heme/Allergies: Negative.   Psychiatric/Behavioral: Negative for depression, hallucinations, memory loss, substance abuse and suicidal ideas. The patient is not nervous/anxious and does not have insomnia.     Blood pressure 136/64, pulse 77, temperature 98.9 F (37.2 C), temperature source Oral, resp. rate 20, height 5\' 9"  (1.753 m), weight 136.1 kg (300 lb), SpO2 98 %.Body mass index is 44.3 kg/m.  General Appearance: Was reading a novel just before interview, calm and cooperative. Appropriate behavior. Good relatedness. Not internally distracted.  Eye Contact::  Good  Speech:  Spontaneous, normal prosody. Normal tone and rate.   Volume:  Normal  Mood:  Euthymic   Affect:  Appropriate and Full Range  Thought Process:  Linear  Orientation:  Full (Time, Place, and Person)  Thought Content:  No delusional theme. No preoccupation with violent thoughts. No negative ruminations. No obsession.  No hallucination in any modality.   Suicidal Thoughts:  No  Homicidal Thoughts:  No  Memory:  Immediate;   Good Recent;   Good Remote;   Good  Judgement:  Good  Insight:  Good  Psychomotor Activity:  Normal  Concentration:  Good  Recall:  Good  Fund of Knowledge:Good  Language: Good  Akathisia:  Negative  Handed:    AIMS (if indicated):     Assets:  Communication Skills Desire for Improvement Housing Intimacy Transportation  Sleep:  Number of Hours: 2.5  Cognition: WNL  ADL's:  Intact   Clinical Assessment::   46 y.o Caucasian male, divorced, lives with his girlfriend and a roommate. Unemployed,  waiting on disability.  Background history of emotional abuse and self harming behavior. Has been diagnosed with depression and Bipolar disorder in the past.  Presented to the ER voluntarily on account of worsening suicidal thoughts and depression. Slit his wrist four hours before presentation. Reported auditory hallucinations of multiple people giving him commands to end his life. There are also making fun of him. Reports seeing shadows. Significantly his brother committed suicide. No alcohol or illicit substance.  Seen today. Says mentally he is at hisbaseline. Says pain is the only issue now. Says he has some pain medications at home. Says he hopes to go home today. Clarified that he drove himself to the hospital. Says his car is parked here and he would drive self home. Says his girlfriend and roommate has been visiting. they both want him  home. Patient denies any suicidal thoughts. No urge to mutilate self. Hallucinations has resolved completely. He is not expressing any delusion.  Sleep is limited by pain. Says that has been a very chronic issue for him.  No  thoughts of homicide. No violent thoughts. No overwhelming anxiety. No access to weapons.  SW has been in contact with his girlfriend Biochemist, clinicalCrista. She feels he is at his baseline. Crista or his roommate do not have any concerns about safety.   Nursing staff reports that he has been polite. He has not been observed to be internally stimulated. No behavioral issues.  Patient was discussed at team. Team members feels that patient is back to his baseline level of function. Team agrees with plan to discharge patient today.   Demographic Factors:  Male, Divorced or widowed, Caucasian and Low socioeconomic status  Loss Factors: Decline in physical health and Financial problems/change in socioeconomic status  Historical Factors: Prior suicide attempts and Impulsivity  Risk Reduction Factors:   Has stable housing and relationship. He is looking forward to getting his disability soon.   Continued Clinical Symptoms:  As above  Cognitive Features That Contribute To Risk:  None    Suicide Risk:  Minimal: No identifiable suicidal ideation.  Patient is not having any thoughts of suicide at this time. Modifiable risk factors targeted during this admission includes depression and chronic pain. Demographical and historical risk factors cannot be modified. Patient is now engaging well. Patient is reliable and is future oriented. We have buffered patient's support structures. At this point, patient is at low risk of suicide. Patient is aware of the effects of psychoactive substances on decision making process. Patient has been provided with emergency contacts. Patient acknowledges to use resources provided if unforseen circumstances changes their current risk stratification.    Follow-up Information    [facility] Follow up on 11/16/2016.   Why:  Therapy appointment 11/14 at 2pm with Dorinda Hillonald. Medication managment 11/20 at 2:30pm with Dr. Melvyn NethLewis.  Contact information: [address] [place], [place] [address] P: [phone] F:           Plan Of Care/Follow-up recommendations:  1. Continue current psychotropic medications 2. Mental health and addiction follow up as arranged.  3. Discharge in care of his girlfriend.  4. Provided limited quantity of prescriptions   Georgiann CockerVincent A Izediuno, MD 11/14/2016, 3:12 PM

## 2016-11-14 NOTE — Progress Notes (Signed)
D: Patient observed sitting in dayroom. States he is too unsteady to come up to med window. Patient states he continues to struggle with generalized pain due to arthritis and fibromyalgia. Rates pain at a 9/10. States, "nothing is helping. The orthopedist says my joints are shot and tells me to go to a pain clinic but I have no insurance so I can't. Nothing you're giving me is helping. What's the point? Next time I won't even bother." Patient's affect flat, sad with depressed and helpless mood. Per self inventory and discussions with writer, rates depression, hopelessness and anxiety all at a 2/10. Rates sleep as poor - states he did not sleep due to pain, chart reflects 2.5 hours. Appetite rated as fair, energy as low and concentration as poor.  States goal for today is "getting out of here, doing as I'm told, no matter how painful it is to me physically." No other physical complaints. Patient's room has strong and foul odor that can be detected out into milieu however patient has showered. Will attempt to wash linens and will consider no roommate order.   A: Medicated per orders, no prns requested or required. Level III obs in place for safety. Emotional support offered and self inventory reviewed. Encouraged completion of Suicide Safety Plan and programming participation. Discussed POC with MD, SW.  Fall prevention plan in place and reviewed with patient as pt is a high fall risk due to generalized pain and weakness. Ambulating with walker.   R: Patient verbalizes understanding of POC, falls prevention education. On reassess, patient . Patient denies SI to this writer but then indicates on his self inventory that he is passive SI. Verbal contract for safety in place. No HI/AVH and remains safe on level III obs. Will continue to monitor closely and make verbal contact frequently.

## 2016-11-14 NOTE — Plan of Care (Signed)
Patient reports he is not sleeping due to uncontrolled, unmanaged pain. Chart reflects 2.5 hours last night.  Patient has not engaged in self harm and while he is hopeless and expressing some passive SI, he denies thoughts to self harm. Verbally contracts for safety.

## 2016-11-14 NOTE — [facility] Suicide Risk Assessment (Signed)
[facility] INPATIENT:  Family/Significant Other Suicide Prevention Education  Suicide Prevention Education:  Education Completed; Zachary [name] (girlfriend [phone]) , has been identified by the patient as the family member/significant other with whom the patient will be residing, and identified as the person(s) who will aid the patient in the event of a mental health crisis (suicidal ideations/suicide attempt).  With written consent from the patient, the family member/significant other has been provided the following suicide prevention education, prior to the and/or following the discharge of the patient.  The suicide prevention education provided includes the following:  Suicide risk factors  Suicide prevention and interventions  National Suicide Hotline telephone number  [facility] assessment telephone number  [facility] Emergency Assistance 911  [facility] and/or [facility]idential Mobile Crisis Unit telephone number  Request made of family/significant other to:  Remove weapons (e.g., guns, rifles, knives), all items previously/currently identified as safety concern.    Remove drugs/medications (over-the-counter, prescriptions, illicit drugs), all items previously/currently identified as a safety concern.  The family member/significant other verbalizes understanding of the suicide prevention education information provided.  The family member/significant other agrees to remove the items of safety concern listed above.  Pt's girlfriend states that she is comfortable with pt returning home. Pt's girlfriend states that pt is stable for discharge. Pt's girlfriend also confirms that pt does not have access to guns or weapons.    Zachary JordanLynn B Damali [name], MSW, LCSWA  11/14/2016, 3:30 PM

## 2016-11-14 NOTE — Progress Notes (Signed)
At 24:00 pt complained of having pain throughout their baby. I asked pt when the pain started and pt stated that it has been going on through the day but has since became more intense. I spoke with pt RN Boyd KerbsPenny and RN spoke with the NP. Even with his complaints of increased pain, pt rolled down the hall to ask me for towels to take a hot shower in an attempt to relieve the pain. After his shower pt finally went to bed around 03:00.

## 2016-11-14 NOTE — Discharge Summary (Signed)
Physician Discharge Summary Note  Patient:  Zachary DodrillJames E Brannick is an 46 y.o., male MRN:  [id number] DOB:  1970-12-26 Patient phone:  [phone] (home)  Patient address:   [address] [place] [address],  Total Time spent with patient: 20 minutes  Date of Admission:  11/11/2016 Date of Discharge: 11/14/16   Reason for Admission:  Worsening depression and SI  Principal Problem: MDD (major depressive disorder) Discharge Diagnoses: Patient Active Problem List   Diagnosis Date Noted  . Osteoarthritis [M19.90] 11/13/2016  . Chronic pain syndrome [G89.4] 11/13/2016  . MDD (major depressive disorder) [F32.9] 11/11/2016  . Suicidal ideation [R45.851] 11/06/2016  . Anemia [D64.9] 11/05/2016  . Bipolar disorder, current episode depressed, severe, without psychotic features (HCC) [F31.4] 08/29/2014  . Morbid obesity (HCC) [E66.01] 08/29/2014  . Hypocalcemia [E83.51]   . AKI (acute kidney injury) (HCC) [N17.9]   . Benign essential HTN [I10]   . GAD (generalized anxiety disorder) [F41.1] 01/01/2013  . Posttraumatic stress disorder [F43.10] 01/01/2013    Past Psychiatric History: Long history of depression. This is his fourth hospitalization. Has had repeated suicidal behavior in the past. Has cut his wrist about thrice in the past. Overdosed on combination of medications and alcohol in the past. He has been tried on multiple medications over the years. Says nothing has really helped. Says he was aggressive on Duloxetine   Past Medical History:  Past Medical History:  Diagnosis Date  . Anxiety   . Arthritis   . Bipolar 1 disorder, depressed (HCC)   . Fibromyalgia   . Hx of degenerative disc disease   . Obesity   . PTSD (post-traumatic stress disorder)     Past Surgical History:  Procedure Laterality Date  . ADENOIDECTOMY    . APPENDECTOMY    . CHOLECYSTECTOMY    . GASTRIC BYPASS    . TYMPANOSTOMY TUBE PLACEMENT     Family History:  Family History  Problem Relation  Age of Onset  . Diabetes Father   . Cancer Father    Family Psychiatric  History: Father and brother had addiction. His brother committed suicide twenty five years ago  Social History:  Social History   Substance and Sexual Activity  Alcohol Use No   Comment: none     Social History   Substance and Sexual Activity  Drug Use No    Social History   Socioeconomic History  . Marital status: Single    Spouse name: None  . Number of children: None  . Years of education: None  . Highest education level: None  Social Needs  . Financial resource strain: None  . Food insecurity - worry: None  . Food insecurity - inability: None  . Transportation needs - medical: None  . Transportation needs - non-medical: None  Occupational History  . None  Tobacco Use  . Smoking status: Never Smoker  . Smokeless tobacco: Never Used  Substance and Sexual Activity  . Alcohol use: No    Comment: none  . Drug use: No  . Sexual activity: Yes    Birth control/protection: Condom  Other Topics Concern  . None  Social History Narrative  . None    [facility]: 46 y.o Caucasian male, divorced, lives with his girlfriend and a roommate. Unemployed,  waiting on disability.  Background history of emotional abuse and self harming behavior. Has been diagnosed with depression and Bipolar disorder in the past.  Presented to the ER voluntarily  on account of worsening suicidal thoughts and depression. Slit his wrist four hours before presentation. Reported auditory hallucinations of multiple people giving him commands to end his life. There are also making fun of him. Reports seeing shadows. Significantly his brother committed suicide. No alcohol or illicit substance. At interview, patient reports being stressed by his multiple  medical issues and inability to get SSI. Says he has not been able to afford his medications. He has been off everything for a long time. Says he was  hurting and just could not cope. He started inflicting self injury at home and his roommate intervened. Says this was not premeditated. No other suicidal behavior recently. Reports chronic pain as a major stressor. Says he has been dealing with it for over a decade. Says since his gastric bypass surgery, it has been tricky with medications. Says he has been very down lately. Has days he could not sleep because of pain. Says after days of sleep deprivation, he begins to hear voices and see shadows. Says he ruminates a lot about all the negative things in his life. No abnormal perception since he has bene here. No persecution or any other form of delusion. No evidence of mania. No  Violent thoughts towards others. No current suicidal thoughts. No access to weapons. Patient is very focused on pain management. Says he used to get Codeine from his girlfriend. Says he is tired of going to urgent care as they are always dismissive.   Patient remained on the [facility] unit for 2 days and stabilized with medications and therapy. Patient was continued on Remeron 15 mg QHS, Risperidone titrated to 1 mg QHS, Robaxin 750 mg Q8H PRN, and was started on Gabapentin 100 mg TID for neuropathic pain. Patient was trialed on Meloxicam for osteoarthritis but patient stated no benefit. It was reported by staff that patient was consistently requesting narcotics for pain, but would report to providers that he knew he wouldn't get them because he did not have a provider to follow up because he has no insurance and he was okay with that. Patient did show improvement with improved mood, affect, sleep, interaction, and appetite. He was attending groups and was seen in the day room interacting appropriately with staff and peers. He was very happy about discharging home and his live-in girlfriend agreed that that he was safe to come home. Patient was discharged with prescriptions for his medications upon discharge. It was arranged for patient to  follow up at [facility].     Physical Findings: AIMS: Facial and Oral Movements Muscles of Facial Expression: None, normal Lips and Perioral Area: None, normal Jaw: None, normal Tongue: None, normal,Extremity Movements Upper (arms, wrists, hands, fingers): None, normal Lower (legs, knees, ankles, toes): None, normal, Trunk Movements Neck, shoulders, hips: None, normal, Overall Severity Severity of abnormal movements (highest score from questions above): None, normal Incapacitation due to abnormal movements: None, normal Patient's awareness of abnormal movements (rate only patient's report): No Awareness, Dental Status Current problems with teeth and/or dentures?: Yes Does patient usually wear dentures?: No  CIWA:    COWS:     Musculoskeletal: Strength & Muscle Tone: within normal limits Gait & Station: normal Patient leans: N/A  Psychiatric Specialty Exam: Physical Exam  Nursing note and vitals reviewed. Constitutional: He is oriented to person, place, and time. He appears well-developed and well-nourished.  Cardiovascular: Normal rate.  Respiratory: Effort normal.  Neurological: He is alert and oriented to person, place, and time.  Skin:  Skin is warm.    Review of Systems  Constitutional: Negative.   HENT: Negative.   Eyes: Negative.   Respiratory: Negative.   Cardiovascular: Negative.   Gastrointestinal: Negative.   Genitourinary: Negative.   Musculoskeletal: Positive for back pain (chronic pain) and joint pain (Osteoarthritis).  Skin: Negative.   Neurological: Negative.   Endo/Heme/Allergies: Negative.   Psychiatric/Behavioral: Negative.     Blood pressure 136/64, pulse 77, temperature 98.9 F (37.2 C), temperature source Oral, resp. rate 20, height 5\' 9"  (1.753 m), weight 136.1 kg (300 lb), SpO2 98 %.Body mass index is 44.3 kg/m.  General Appearance: Casual  Eye Contact:  Good  Speech:  Clear and Coherent and Normal Rate  Volume:  Normal   Mood:  Euthymic  Affect:  Appropriate  Thought Process:  Goal Directed and Descriptions of Associations: Intact  Orientation:  Full (Time, Place, and Person)  Thought Content:  WDL  Suicidal Thoughts:  No  Homicidal Thoughts:  No  Memory:  Immediate;   Good Recent;   Good Remote;   Good  Judgement:  Good  Insight:  Good  Psychomotor Activity:  Normal  Concentration:  Concentration: Good and Attention Span: Good  Recall:  Good  Fund of Knowledge:  Good  Language:  Good  Akathisia:  No  Handed:  Right  AIMS (if indicated):     Assets:  Communication Skills Desire for Improvement Financial Resources/Insurance Housing Social Support Transportation  ADL's:  Intact  Cognition:  WNL  Sleep:  Number of Hours: 2.5     Have you used any form of tobacco in the last 30 days? (Cigarettes, Smokeless Tobacco, Cigars, and/or Pipes): No  Has this patient used any form of tobacco in the last 30 days? (Cigarettes, Smokeless Tobacco, Cigars, and/or Pipes) Yes, No  Blood Alcohol level:  Lab Results  Component Value Date   ETH <10 11/04/2016   ETH <5 08/26/2014    Metabolic Disorder Labs:  No results found for: HGBA1C, MPG No results found for: PROLACTIN No results found for: CHOL, TRIG, HDL, CHOLHDL, VLDL, LDLCALC  See Psychiatric Specialty Exam and Suicide Risk Assessment completed by Attending Physician prior to discharge.  Discharge destination:  Home  Is patient on multiple antipsychotic therapies at discharge:  No   Has Patient had three or more failed trials of antipsychotic monotherapy by history:  No  Recommended Plan for Multiple Antipsychotic Therapies: NA   Allergies as of 11/14/2016      Reactions   Capsaicin Other (See Comments)   Burning on skin - causes blisters immediately    Codeine Itching   Latex Itching, Rash      Medication List    STOP taking these medications   mirtazapine 15 MG tablet Commonly known as:  REMERON Replaced by:  mirtazapine 15  MG disintegrating tablet   traZODone 150 MG tablet Commonly known as:  DESYREL     TAKE these medications     Indication  calcium-vitamin D 500-200 MG-UNIT tablet Commonly known as:  OSCAL 500/200 D-3 Take 2 tablets 2 (two) times daily by mouth.  Indication:  per PCP   cyanocobalamin 1000 MCG tablet Take 1 tablet (1,000 mcg total) daily by mouth.  Indication:  per PCP   ferrous sulfate 325 (65 FE) MG tablet Take 1 tablet (325 mg total) by mouth daily with breakfast.  Indication:  Iron Deficiency   gabapentin 100 MG capsule Commonly known as:  NEURONTIN Take 1 capsule (100 mg total) 3 (three) times daily  by mouth. For pain What changed:  additional instructions  Indication:  Neuropathic Pain   methocarbamol 750 MG tablet Commonly known as:  ROBAXIN Take 1 tablet (750 mg total) every 8 (eight) hours as needed by mouth for muscle spasms.  Indication:  Musculoskeletal Pain   mirtazapine 15 MG disintegrating tablet Commonly known as:  REMERON SOL-TAB Take 1 tablet (15 mg total) at bedtime by mouth. For mood control Replaces:  mirtazapine 15 MG tablet  Indication:  Mood stability   multivitamin with minerals tablet Take 1 tablet daily by mouth.  Indication:  per PCP   naproxen sodium 220 MG tablet Commonly known as:  ALEVE Take 880 mg by mouth daily as needed (pain).  Indication:  Pain   protein supplement shake Liqd Commonly known as:  PREMIER PROTEIN Take 325 mLs (11 oz total) 2 (two) times daily between meals by mouth.  Indication:  per PCP   risperiDONE 1 MG disintegrating tablet Commonly known as:  RISPERDAL M-TABS Take 1 tablet (1 mg total) at bedtime by mouth. Mood control  Indication:  Mood stability      Follow-up Information    [facility] Follow up on 11/16/2016.   Why:  Therapy appointment 11/14 at 2pm with Dorinda Hill. Medication managment 11/20 at 2:30pm with Dr. Melvyn Neth.  Contact information: [address] [place], [place]  [address] P: [phone] F:           Follow-up recommendations:  Continue activity as tolerated. Continue diet as recommended by your PCP. Ensure to keep all appointments with outpatient providers.  Comments:  Patient is instructed prior to discharge to: Take all medications as prescribed by his/her mental healthcare provider. Report any adverse effects and or reactions from the medicines to his/her outpatient provider promptly. Patient has been instructed & cautioned: To not engage in alcohol and or illegal drug use while on prescription medicines. In the event of worsening symptoms, patient is instructed to call the crisis hotline, 911 and or go to the nearest ED for appropriate evaluation and treatment of symptoms. To follow-up with his/her primary care provider for your other medical issues, concerns and or health care needs.    Signed: Gerlene Burdock Money, FNP 11/14/2016, 3:25 PM

## 2020-09-25 ENCOUNTER — Inpatient Hospital Stay (HOSPITAL_COMMUNITY)
Admission: EM | Admit: 2020-09-25 | Discharge: 2020-09-29 | DRG: 812 | Disposition: A | Discharge status: Other Acute Inpatient Hospital | Payer: Medicare Other | Attending: Internal Medicine | Admitting: Internal Medicine

## 2020-09-25 ENCOUNTER — Inpatient Hospital Stay (HOSPITAL_COMMUNITY): Payer: Medicare Other

## 2020-09-25 ENCOUNTER — Other Ambulatory Visit: Payer: Self-pay

## 2020-09-25 ENCOUNTER — Encounter (HOSPITAL_COMMUNITY): Payer: Self-pay

## 2020-09-25 DIAGNOSIS — R195 Other fecal abnormalities: Secondary | ICD-10-CM | POA: Diagnosis present

## 2020-09-25 DIAGNOSIS — Z833 Family history of diabetes mellitus: Secondary | ICD-10-CM

## 2020-09-25 DIAGNOSIS — Z885 Allergy status to narcotic agent status: Secondary | ICD-10-CM | POA: Diagnosis not present

## 2020-09-25 DIAGNOSIS — S61519A Laceration without foreign body of unspecified wrist, initial encounter: Secondary | ICD-10-CM | POA: Diagnosis present

## 2020-09-25 DIAGNOSIS — X789XXA Intentional self-harm by unspecified sharp object, initial encounter: Secondary | ICD-10-CM | POA: Diagnosis present

## 2020-09-25 DIAGNOSIS — K8020 Calculus of gallbladder without cholecystitis without obstruction: Secondary | ICD-10-CM | POA: Diagnosis present

## 2020-09-25 DIAGNOSIS — S51812A Laceration without foreign body of left forearm, initial encounter: Secondary | ICD-10-CM | POA: Diagnosis present

## 2020-09-25 DIAGNOSIS — Z888 Allergy status to other drugs, medicaments and biological substances status: Secondary | ICD-10-CM | POA: Diagnosis not present

## 2020-09-25 DIAGNOSIS — S51811A Laceration without foreign body of right forearm, initial encounter: Secondary | ICD-10-CM | POA: Diagnosis present

## 2020-09-25 DIAGNOSIS — Z91040 Latex allergy status: Secondary | ICD-10-CM

## 2020-09-25 DIAGNOSIS — Z79899 Other long term (current) drug therapy: Secondary | ICD-10-CM

## 2020-09-25 DIAGNOSIS — R45851 Suicidal ideations: Secondary | ICD-10-CM

## 2020-09-25 DIAGNOSIS — F0631 Mood disorder due to known physiological condition with depressive features: Secondary | ICD-10-CM

## 2020-09-25 DIAGNOSIS — Z20822 Contact with and (suspected) exposure to covid-19: Secondary | ICD-10-CM | POA: Diagnosis present

## 2020-09-25 DIAGNOSIS — R634 Abnormal weight loss: Secondary | ICD-10-CM | POA: Diagnosis present

## 2020-09-25 DIAGNOSIS — M25569 Pain in unspecified knee: Secondary | ICD-10-CM | POA: Diagnosis present

## 2020-09-25 DIAGNOSIS — D649 Anemia, unspecified: Secondary | ICD-10-CM

## 2020-09-25 DIAGNOSIS — F3181 Bipolar II disorder: Secondary | ICD-10-CM

## 2020-09-25 DIAGNOSIS — G8929 Other chronic pain: Secondary | ICD-10-CM | POA: Diagnosis present

## 2020-09-25 DIAGNOSIS — Z7289 Other problems related to lifestyle: Secondary | ICD-10-CM

## 2020-09-25 DIAGNOSIS — M1990 Unspecified osteoarthritis, unspecified site: Secondary | ICD-10-CM | POA: Diagnosis present

## 2020-09-25 DIAGNOSIS — F419 Anxiety disorder, unspecified: Secondary | ICD-10-CM | POA: Diagnosis present

## 2020-09-25 DIAGNOSIS — M797 Fibromyalgia: Secondary | ICD-10-CM | POA: Diagnosis present

## 2020-09-25 DIAGNOSIS — Z6841 Body Mass Index (BMI) 40.0 and over, adult: Secondary | ICD-10-CM

## 2020-09-25 DIAGNOSIS — M549 Dorsalgia, unspecified: Secondary | ICD-10-CM | POA: Diagnosis present

## 2020-09-25 DIAGNOSIS — D509 Iron deficiency anemia, unspecified: Principal | ICD-10-CM | POA: Diagnosis present

## 2020-09-25 DIAGNOSIS — Z9884 Bariatric surgery status: Secondary | ICD-10-CM

## 2020-09-25 LAB — RAPID URINE DRUG SCREEN, HOSP PERFORMED
Amphetamines: NOT DETECTED
Barbiturates: NOT DETECTED
Benzodiazepines: NOT DETECTED
Cocaine: NOT DETECTED
Opiates: NOT DETECTED
Tetrahydrocannabinol: NOT DETECTED

## 2020-09-25 LAB — IRON AND TIBC
Iron: 16 ug/dL — ABNORMAL LOW (ref 45–182)
Saturation Ratios: 5 % — ABNORMAL LOW (ref 17.9–39.5)
TIBC: 312 ug/dL (ref 250–450)
UIBC: 296 ug/dL

## 2020-09-25 LAB — CBC WITH DIFFERENTIAL/PLATELET
Abs Immature Granulocytes: 0.01 10*3/uL (ref 0.00–0.07)
Basophils Absolute: 0 10*3/uL (ref 0.0–0.1)
Basophils Relative: 0 %
Eosinophils Absolute: 0 10*3/uL (ref 0.0–0.5)
Eosinophils Relative: 0 %
HCT: 21.6 % — ABNORMAL LOW (ref 39.0–52.0)
Hemoglobin: 5.3 g/dL — CL (ref 13.0–17.0)
Immature Granulocytes: 0 %
Lymphocytes Relative: 22 %
Lymphs Abs: 1.2 10*3/uL (ref 0.7–4.0)
MCH: 17.3 pg — ABNORMAL LOW (ref 26.0–34.0)
MCHC: 24.5 g/dL — ABNORMAL LOW (ref 30.0–36.0)
MCV: 70.4 fL — ABNORMAL LOW (ref 80.0–100.0)
Monocytes Absolute: 0.4 10*3/uL (ref 0.1–1.0)
Monocytes Relative: 7 %
Neutro Abs: 3.8 10*3/uL (ref 1.7–7.7)
Neutrophils Relative %: 71 %
Platelets: 162 10*3/uL (ref 150–400)
RBC: 3.07 MIL/uL — ABNORMAL LOW (ref 4.22–5.81)
RDW: 20.2 % — ABNORMAL HIGH (ref 11.5–15.5)
WBC: 5.4 10*3/uL (ref 4.0–10.5)
nRBC: 0 % (ref 0.0–0.2)

## 2020-09-25 LAB — VITAMIN B12: Vitamin B-12: 517 pg/mL (ref 180–914)

## 2020-09-25 LAB — RETICULOCYTES
Immature Retic Fract: 15.3 % (ref 2.3–15.9)
RBC.: 3.2 MIL/uL — ABNORMAL LOW (ref 4.22–5.81)
Retic Count, Absolute: 47.7 10*3/uL (ref 19.0–186.0)
Retic Ct Pct: 1.5 % (ref 0.4–3.1)

## 2020-09-25 LAB — FERRITIN: Ferritin: 2 ng/mL — ABNORMAL LOW (ref 24–336)

## 2020-09-25 LAB — PREPARE RBC (CROSSMATCH)

## 2020-09-25 LAB — RESP PANEL BY RT-PCR (FLU A&B, COVID) ARPGX2
Influenza A by PCR: NEGATIVE
Influenza B by PCR: NEGATIVE
SARS Coronavirus 2 by RT PCR: NEGATIVE

## 2020-09-25 LAB — COMPREHENSIVE METABOLIC PANEL
ALT: 19 U/L (ref 0–44)
AST: 16 U/L (ref 15–41)
Albumin: 2.6 g/dL — ABNORMAL LOW (ref 3.5–5.0)
Alkaline Phosphatase: 128 U/L — ABNORMAL HIGH (ref 38–126)
Anion gap: 3 — ABNORMAL LOW (ref 5–15)
BUN: 24 mg/dL — ABNORMAL HIGH (ref 6–20)
CO2: 21 mmol/L — ABNORMAL LOW (ref 22–32)
Calcium: 8 mg/dL — ABNORMAL LOW (ref 8.9–10.3)
Chloride: 118 mmol/L — ABNORMAL HIGH (ref 98–111)
Creatinine, Ser: 1.07 mg/dL (ref 0.61–1.24)
GFR, Estimated: >60 mL/min (ref >60)
Glucose, Bld: 87 mg/dL (ref 70–99)
Potassium: 4.4 mmol/L (ref 3.5–5.1)
Sodium: 142 mmol/L (ref 135–145)
Total Bilirubin: 0.5 mg/dL (ref 0.3–1.2)
Total Protein: 5.7 g/dL — ABNORMAL LOW (ref 6.5–8.1)

## 2020-09-25 LAB — FOLATE: Folate: 8 ng/mL (ref >5.9)

## 2020-09-25 LAB — SALICYLATE LEVEL: Salicylate Lvl: <7 mg/dL — ABNORMAL LOW (ref 7.0–30.0)

## 2020-09-25 LAB — ETHANOL: Alcohol, Ethyl (B): <10 mg/dL (ref <10)

## 2020-09-25 LAB — POC OCCULT BLOOD, ED: Fecal Occult Bld: POSITIVE — AB

## 2020-09-25 LAB — ACETAMINOPHEN LEVEL: Acetaminophen (Tylenol), Serum: <10 ug/mL — ABNORMAL LOW (ref 10–30)

## 2020-09-25 LAB — PROTIME-INR
INR: 1.3 — ABNORMAL HIGH (ref 0.8–1.2)
Prothrombin Time: 15.7 seconds — ABNORMAL HIGH (ref 11.4–15.2)

## 2020-09-25 MED ORDER — SODIUM CHLORIDE 0.9 % IV SOLN: 250.0000 mL | INTRAVENOUS | Status: DC | PRN

## 2020-09-25 MED ORDER — SODIUM CHLORIDE 0.9% FLUSH
3.0000 mL | Freq: Two times a day (BID) | INTRAVENOUS | Status: DC
  Administered 2020-09-25 – 2020-09-28 (×7): 3 mL via INTRAVENOUS

## 2020-09-25 MED ORDER — TRAMADOL HCL 50 MG PO TABS
50.0000 mg | ORAL_TABLET | Freq: Four times a day (QID) | ORAL | Status: DC | PRN
  Administered 2020-09-29: 50 mg via ORAL
  Filled 2020-09-25: qty 1

## 2020-09-25 MED ORDER — HYDROMORPHONE HCL 1 MG/ML IJ SOLN
1.0000 mg | INTRAMUSCULAR | Status: AC | PRN
  Administered 2020-09-26 (×4): 1 mg via INTRAVENOUS
  Filled 2020-09-25 (×4): qty 1

## 2020-09-25 MED ORDER — SODIUM CHLORIDE 0.9 % IV SOLN
10.0000 mL/h | Freq: Once | INTRAVENOUS | Status: AC
  Administered 2020-09-25: 10 mL/h via INTRAVENOUS

## 2020-09-25 MED ORDER — IOHEXOL 350 MG/ML SOLN
80.0000 mL | Freq: Once | INTRAVENOUS | Status: AC | PRN
  Administered 2020-09-25: 80 mL via INTRAVENOUS

## 2020-09-25 MED ORDER — ACETAMINOPHEN 325 MG PO TABS
650.0000 mg | ORAL_TABLET | Freq: Four times a day (QID) | ORAL | Status: DC | PRN
  Administered 2020-09-26: 650 mg via ORAL
  Filled 2020-09-25: qty 2

## 2020-09-25 MED ORDER — ONDANSETRON HCL 4 MG PO TABS
4.0000 mg | ORAL_TABLET | Freq: Four times a day (QID) | ORAL | Status: DC | PRN
  Administered 2020-09-29 (×2): 4 mg via ORAL
  Filled 2020-09-25 (×2): qty 1

## 2020-09-25 MED ORDER — ACETAMINOPHEN 650 MG RE SUPP: 650.0000 mg | Freq: Four times a day (QID) | RECTAL | Status: DC | PRN

## 2020-09-25 MED ORDER — PANTOPRAZOLE SODIUM 40 MG IV SOLR
40.0000 mg | Freq: Every day | INTRAVENOUS | Status: DC
  Administered 2020-09-25: 40 mg via INTRAVENOUS
  Filled 2020-09-25: qty 40

## 2020-09-25 MED ORDER — ONDANSETRON HCL 4 MG/2ML IJ SOLN: 4.0000 mg | Freq: Four times a day (QID) | INTRAMUSCULAR | Status: DC | PRN

## 2020-09-25 MED ORDER — SODIUM CHLORIDE 0.9% FLUSH: 3.0000 mL | INTRAVENOUS | Status: DC | PRN

## 2020-09-25 MED ORDER — KETOROLAC TROMETHAMINE 15 MG/ML IJ SOLN: 15.0000 mg | Freq: Once | INTRAMUSCULAR | Status: DC

## 2020-09-25 MED ORDER — HYDROMORPHONE HCL 1 MG/ML IJ SOLN
1.0000 mg | Freq: Once | INTRAMUSCULAR | Status: AC
  Administered 2020-09-25: 1 mg via INTRAVENOUS
  Filled 2020-09-25: qty 1

## 2020-09-25 NOTE — H&P (Signed)
History and Physical    Zachary [name] JYN:[id number] DOB: Feb 11, 1970 DOA: 09/25/2020  PCP: Pcp, No   Patient coming from: Home  Chief Complaint: Suicidal ideation, generalized weakness  HPI: Zachary [name] is a 50 y.o. male with medical history significant for biliopancreatic diversion with duodenal switch, anemia requiring blood transfusion, chronic back and lower extremity pain, PTSD, bipolar, depression who presented for evaluation of suicidal ideation.  He reports that he has been having increased depression and anxiety with building thoughts of suicide over the last few weeks and states he started taking an overdose versus threatening a cop with a gun so he would get shot but decided he would cut his wrists.  He used a razor blade to cut his wrist and has multiple superficial cuts of the ventral forearms bilaterally.  He reports he does see psychiatry and has had telemetry visits over the computer.  He has been taking his medications which include Robaxin and Klonopin and gabapentin.  He also switched to a new psychiatrist recently.  He reports he has chronic knee and back pain.  He has no recent fevers illness chest pain shortness of breath.  He states he has had intermittent lower abdominal pain and he has lost 50 pounds unintentionally in the last 4 to 5 months.  He has been admitted to behavioral health in the past.  He was last hospitalized in 2018 for suicidal ideation.  ED Course: In the emergency room is been hemodynamically stable.  He is found that his hemoglobin of 5.3 with a microcytic anemia.  He is Hemoccult positive in the emergency room but states he has not noticed any black tarry stools and he has not had any vomiting or hematemesis.  Lab work shows WBC of 5400 hemoglobin 5.3 hematocrit 21.6 platelets under 60,000 MCV 70.4 MCH 17.3 iron 16 ferritin 2 sodium 142 potassium 4.4 chloride 118 bicarb 21 creatinine 1.07 BUN 24 glucose 87 alkaline phosphatase 128 AST 16 ALT 19  bilirubin 0.5 albumin 2.6 calcium 8.0 INR 1.3 salicylate level less than 7 acetaminophen level less than 10 reticulocyte count 47.7 immature reticulocyte fraction 15.3  Review of Systems:  General: Denies fever, chills, night sweats.  Denies dizziness.  Denies change in appetite HENT: Denies head trauma, headache, denies change in hearing, tinnitus. Denies nasal congestion. Denies sore throat, Denies difficulty swallowing Eyes: Denies blurry vision, pain in eye, drainage.  Denies discoloration of eyes. Neck: Denies pain.  Denies swelling.  Denies pain with movement. Cardiovascular: Denies chest pain, palpitations.  Denies edema.  Denies orthopnea Respiratory: Denies shortness of breath, cough.  Denies wheezing.  Denies sputum production Gastrointestinal: Reports intermittent abdominal pain and firm knot in left inguinal crease. Denies nausea, vomiting, diarrhea.  Denies melena.  Denies hematemesis. Musculoskeletal: Denies limitation of movement.  Denies deformity or swelling.  Denies pain.  Denies arthralgias or myalgias. Genitourinary: Denies pelvic pain.  Denies urinary frequency or hesitancy.  Denies dysuria.  Skin: Denies rash.  Denies petechiae, purpura, ecchymosis. Neurological: Denies syncope.  Denies seizure activity. Denies paresthesia. Denies slurred speech, drooping face.  Denies visual change. Psychiatric: Reports suicidal ideation and chronic anxiety/depression. Denies hallucinations.  Past Medical History:  Diagnosis Date   Anxiety    Arthritis    Bipolar 1 disorder, depressed (HCC)    Fibromyalgia    Hx of degenerative disc disease    Obesity    PTSD (post-traumatic stress disorder)     Past Surgical History:  Procedure Laterality Date   ADENOIDECTOMY  APPENDECTOMY     CHOLECYSTECTOMY     GASTRIC BYPASS     TYMPANOSTOMY TUBE PLACEMENT      Social History  reports that he has never smoked. He has never used smokeless tobacco. He reports that he does not drink  alcohol and does not use drugs.  Allergies  Allergen Reactions   Capsaicin Other (See Comments)    Burning on skin - causes blisters immediately    Codeine Itching   Latex Itching and Rash    Family History  Problem Relation Age of Onset   Diabetes Father    Cancer Father      Prior to Admission medications   Medication Sig Start Date End Date Taking? Authorizing Provider  acetaminophen (TYLENOL) 500 MG tablet Take 1,000 mg by mouth every 6 (six) hours as needed for moderate pain.   Yes [provider]  calcium-vitamin D (OSCAL 500/200 D-3) 500-200 MG-UNIT tablet Take 2 tablets 2 (two) times daily by mouth. 11/11/16 09/25/21 Yes Zigmund Daniel., MD  ferrous sulfate 325 (65 FE) MG tablet Take 1 tablet (325 mg total) by mouth daily with breakfast. 09/02/14  Yes Thermon Leyland, NP  gabapentin (NEURONTIN) 300 MG capsule Take 300 mg by mouth 3 (three) times daily. 09/09/20  Yes [provider]  methocarbamol (ROBAXIN) 500 MG tablet Take 1,000 mg by mouth at bedtime. 09/14/20  Yes [provider]  Potassium 99 MG TABS Take 99 mg by mouth 2 (two) times daily. for muscle spasms   Yes [provider]  clonazePAM (KLONOPIN) 1 MG tablet Take 2 mg by mouth every evening. 09/14/20   [provider]  mirtazapine (REMERON SOL-TAB) 15 MG disintegrating tablet Take 1 tablet (15 mg total) at bedtime by mouth. For mood control 11/14/16   Money, Feliz Beam B, FNP  risperiDONE (RISPERDAL M-TABS) 1 MG disintegrating tablet Take 1 tablet (1 mg total) at bedtime by mouth. Mood control 11/14/16   MoneyGerlene Burdock, FNP    Physical Exam: Vitals:   09/25/20 1735 09/25/20 2022 09/25/20 2027 09/25/20 2042  BP: 135/82 (!) 130/52 (!) 130/52 137/67  Pulse: 88 76 76 78  Resp: 19 18 13 14   Temp: 98.8 F (37.1 C) 98.6 F (37 C) 98.6 F (37 C) 98 F (36.7 C)  TempSrc: Oral Oral Oral Oral  SpO2: 100%  100% 100%  Weight:      Height:        Constitutional: NAD, calm,  comfortable Vitals:   09/25/20 1735 09/25/20 2022 09/25/20 2027 09/25/20 2042  BP: 135/82 (!) 130/52 (!) 130/52 137/67  Pulse: 88 76 76 78  Resp: 19 18 13 14   Temp: 98.8 F (37.1 C) 98.6 F (37 C) 98.6 F (37 C) 98 F (36.7 C)  TempSrc: Oral Oral Oral Oral  SpO2: 100%  100% 100%  Weight:      Height:       General: WDWN, Alert and oriented x3.  Eyes: EOMI, PERRL, conjunctivae normal.  Sclera nonicteric HENT:  [place]/AT, external ears normal.  Nares patent without epistasis.  Mucous membranes are moist. Posterior pharynx clear  Neck: Soft, normal range of motion, supple, no masses, Trachea midline Respiratory: clear to auscultation bilaterally, no wheezing, no crackles. Normal respiratory effort. No accessory muscle use.  Cardiovascular: Regular rate and rhythm, no murmurs / rubs / gallops. Has chronic lower extremity edema.  Abdomen: Soft, mild lower abdomen tenderness, nondistended, no rebound or guarding.  No masses palpated. Morbid obesity. Bowel sounds  normoactive Musculoskeletal: FROM. no cyanosis. No joint deformity upper and lower extremities. Normal muscle tone.  Skin: Warm, dry, intact no rashes, lesions, ulcers. No induration Neurologic: CN 2-12 grossly intact.  Normal speech.  Sensation intact, patella DTR +1 bilaterally. Strength 5/5 in all extremities.   Psychiatric: Normal judgment and insight.  Normal mood.    Labs on Admission: I have personally reviewed following labs and imaging studies  CBC: Recent Labs  Lab 09/25/20 1755  WBC 5.4  NEUTROABS 3.8  HGB 5.3*  HCT 21.6*  MCV 70.4*  PLT 162    Basic Metabolic Panel: Recent Labs  Lab 09/25/20 1755  NA 142  K 4.4  CL 118*  CO2 21*  GLUCOSE 87  BUN 24*  CREATININE 1.07  CALCIUM 8.0*    GFR: Estimated Creatinine Clearance: 113.1 mL/min (by C-G formula based on SCr of 1.07 mg/dL).  Liver Function Tests: Recent Labs  Lab 09/25/20 1755  AST 16  ALT 19  ALKPHOS 128*  BILITOT 0.5  PROT 5.7*   ALBUMIN 2.6*    Urine analysis: No results found for: COLORURINE, APPEARANCEUR, LABSPEC, PHURINE, GLUCOSEU, HGBUR, BILIRUBINUR, KETONESUR, PROTEINUR, UROBILINOGEN, NITRITE, LEUKOCYTESUR  Radiological Exams on Admission: No results found.   Assessment/Plan Principal Problem:   Iron deficiency anemia Mr. Verhelst is admitted to Med Surg floor.  Given PRBC transfusion of 2 units then will recheck Hgb level and make further determination.  Is hemoccult positive in ER and will consult GI to see pt in am.  Has complex history with gastric bypass a few years ago but still morbidly obese. He reports unintended weight loss past few months.   Active Problems:   Morbid obesity Has had gastric bypass in past.    Suicidal ideation Consult psychiatry in am    Unintentional weight loss Obtain CT abdomen and pelvis with contrast. He reports a firm mass in left inguinal crease for past few months that he is worried about as he has a family history of cancer he states.     DVT prophylaxis: SCDs for DVT prophylaxis.   Code Status:   Full Code  Family Communication:  Diagnosis and plan discussed with patient.  Patient verbalized understanding agrees with plan.  Further recommendations as clinical indicated Disposition Plan:   Patient is from:  Home  Anticipated DC to:  To be determined after evaluation by psychiatry.  Anticipated DC date:  Anticipate greater than 2 midnight stay  Admission status:  Inpatient   Claudean Severance Devlyn Parish MD [facility]  How to contact the [facility] Attending or Consulting provider 7A - 7P or covering provider during after hours 7P -7A, for this patient?   Check the care team in [facility] and look for a) attending/consulting [facility] provider listed and b) the [facility] team listed Log into [email or web address] and use [place]'s universal password to access. If you do not have the password, please contact the hospital operator. Locate the [facility] provider you are looking for under  [facility] and page to a number that you can be directly reached. If you still have difficulty reaching the provider, please page the [facility] (Director on Call) for the Hospitalists listed on amion for assistance.  09/25/2020, 9:21 PM

## 2020-09-25 NOTE — Plan of Care (Signed)
49/M admitted 09/25/20 with a diagnosis of "iron deficiency anemia". Patient is s/p suicide attempt and has a 1:1 sitter. Patient is receiving 1st of 2 units of PRBC upon arrival to the unit.   Problem: Education: Goal: Knowledge of warning signs, risks, and behaviors that relate to suicide ideation and self-harm behaviors will improve Outcome: Progressing   Problem: Health Behavior/Discharge (Transition) Planning: Goal: Ability to manage health-related needs will improve Outcome: Progressing   Problem: Clinical Measurements: Goal: Remain free from any harm during hospitalization Outcome: Progressing   Problem: Nutrition: Goal: Adequate fluids and nutrition will be maintained Outcome: Progressing   Problem: Coping: Goal: Ability to disclose and discuss thoughts of suicide and self-harm will improve Outcome: Progressing   Problem: Medication Management: Goal: Adhere to prescribed medication regimen Outcome: Progressing   Problem: Sleep Hygiene: Goal: Ability to obtain adequate restful sleep will improve Outcome: Progressing   Problem: Self Esteem: Goal: Ability to verbalize positive feeling about self will improve Outcome: Progressing   Problem: Education: Goal: Knowledge of General Education information will improve Description: Including pain rating scale, medication(s)/side effects and non-pharmacologic comfort measures Outcome: Progressing   Problem: Health Behavior/Discharge Planning: Goal: Ability to manage health-related needs will improve Outcome: Progressing   Problem: Clinical Measurements: Goal: Ability to maintain clinical measurements within normal limits will improve Outcome: Progressing Goal: Will remain free from infection Outcome: Progressing Goal: Diagnostic test results will improve Outcome: Progressing Goal: Respiratory complications will improve Outcome: Progressing Goal: Cardiovascular complication will be avoided Outcome: Progressing    Problem: Nutrition: Goal: Adequate nutrition will be maintained Outcome: Progressing   Problem: Coping: Goal: Level of anxiety will decrease Outcome: Progressing   Problem: Elimination: Goal: Will not experience complications related to bowel motility Outcome: Progressing Goal: Will not experience complications related to urinary retention Outcome: Progressing   Problem: Pain Managment: Goal: General experience of comfort will improve Outcome: Progressing   Problem: Safety: Goal: Ability to remain free from injury will improve Outcome: Progressing   Problem: Skin Integrity: Goal: Risk for impaired skin integrity will decrease Outcome: Progressing

## 2020-09-25 NOTE — ED Provider Notes (Signed)
[facility]-EMERGENCY DEPT Provider Note   CSN: [id number] Arrival date & time: 09/25/20  1709     History Chief Complaint  Patient presents with   Psychiatric Evaluation    Zachary [name] is a 50 y.o. male.  Patient with history of biliopancreatic diversion with duodenal switch, anemia requiring blood transfusion, chronic back and lower extremity pain, PTSD, bipolar, depression --presents to the emergency department today for evaluation of suicidal ideation.  Patient reports that these feelings have been building over the past few weeks.  He reports wanting to overdose on medication or grabbing the gun a police officer in order to try to get shot.  He states previous attempts to harm himself.  Patient presents with superficial cuts to his bilateral upper extremities made last night using a razor blade.  He states that he attempted to take higher doses of his chronic medications including Klonopin, Robaxin, gabapentin in order to harm himself but this did not have an effect he surmises due to his gastric bypass surgery.  Other than chronic back pain and knee pains, he denies acute medical issues including fevers, URI symptoms, chest pain, shortness of breath, vomiting, diarrhea.  He reports previous admissions to behavioral health.  States that he currently lives near [place], [place]. The onset of this condition was acute. The course is worsening. Aggravating factors: none. Alleviating factors: none.        Past Medical History:  Diagnosis Date   Anxiety    Arthritis    Bipolar 1 disorder, depressed (HCC)    Fibromyalgia    Hx of degenerative disc disease    Obesity    PTSD (post-traumatic stress disorder)     Patient Active Problem List   Diagnosis Date Noted   Osteoarthritis 11/13/2016   Chronic pain syndrome 11/13/2016   MDD (major depressive disorder) 11/11/2016   Suicidal ideation 11/06/2016   Anemia 11/05/2016   Bipolar disorder, current  episode depressed, severe, without psychotic features (HCC) 08/29/2014   Morbid obesity (HCC) 08/29/2014   Hypocalcemia    AKI (acute kidney injury) (HCC)    Benign essential HTN    GAD (generalized anxiety disorder) 01/01/2013   Posttraumatic stress disorder 01/01/2013    Past Surgical History:  Procedure Laterality Date   ADENOIDECTOMY     APPENDECTOMY     CHOLECYSTECTOMY     GASTRIC BYPASS     TYMPANOSTOMY TUBE PLACEMENT         Family History  Problem Relation Age of Onset   Diabetes Father    Cancer Father     Social History   Tobacco Use   Smoking status: Never   Smokeless tobacco: Never  Substance Use Topics   Alcohol use: No    Comment: none   Drug use: No    Home Medications Prior to Admission medications   Medication Sig Start Date End Date Taking? Authorizing Provider  calcium-vitamin D (OSCAL 500/200 D-3) 500-200 MG-UNIT tablet Take 2 tablets 2 (two) times daily by mouth. 11/11/16 12/11/16  Zigmund Daniel., MD  ferrous sulfate 325 (65 FE) MG tablet Take 1 tablet (325 mg total) by mouth daily with breakfast. Patient not taking: Reported on 11/04/2016 09/02/14   Thermon Leyland, NP  gabapentin (NEURONTIN) 100 MG capsule Take 1 capsule (100 mg total) 3 (three) times daily by mouth. For pain 11/14/16   Money, Gerlene Burdock, FNP  methocarbamol (ROBAXIN) 750 MG tablet Take 1 tablet (750 mg total) every 8 (eight)  hours as needed by mouth for muscle spasms. 11/14/16   Money, Gerlene Burdock, FNP  mirtazapine (REMERON SOL-TAB) 15 MG disintegrating tablet Take 1 tablet (15 mg total) at bedtime by mouth. For mood control 11/14/16   Money, Feliz Beam B, FNP  naproxen sodium (ALEVE) 220 MG tablet Take 880 mg by mouth daily as needed (pain).     [provider]  risperiDONE (RISPERDAL M-TABS) 1 MG disintegrating tablet Take 1 tablet (1 mg total) at bedtime by mouth. Mood control 11/14/16   Money, Gerlene Burdock, FNP    Allergies    Capsaicin, Codeine, and Latex  Review of  Systems   Review of Systems  Constitutional:  Negative for fever.  HENT:  Negative for rhinorrhea and sore throat.   Eyes:  Negative for redness.  Respiratory:  Negative for cough.   Cardiovascular:  Negative for chest pain.  Gastrointestinal:  Negative for abdominal pain, diarrhea, nausea and vomiting.  Genitourinary:  Negative for dysuria and hematuria.  Musculoskeletal:  Positive for arthralgias, back pain and myalgias.  Skin:  Negative for rash.  Neurological:  Negative for headaches.  Psychiatric/Behavioral:  Positive for sleep disturbance and suicidal ideas. The patient is nervous/anxious.    Physical Exam Updated Vital Signs BP 135/82 (BP Location: Left Arm)   Pulse 88   Temp 98.8 F (37.1 C) (Oral)   Resp 19   Ht 5\' 9"  (1.753 m)   Wt 136 kg   SpO2 100%   BMI 44.28 kg/m   Physical Exam Vitals and nursing note reviewed. Exam conducted with a chaperone present.  Constitutional:      General: He is not in acute distress.    Appearance: He is well-developed.  HENT:     Head: Normocephalic and atraumatic.     Right Ear: External ear normal.     Left Ear: External ear normal.     Mouth/Throat:     Mouth: Mucous membranes are moist.  Eyes:     General:        Right eye: No discharge.        Left eye: No discharge.     Pupils: Pupils are equal, round, and reactive to light.     Comments: Conjunctiva pale  Cardiovascular:     Rate and Rhythm: Normal rate and regular rhythm.     Heart sounds: Normal heart sounds.  Pulmonary:     Effort: Pulmonary effort is normal.     Breath sounds: Normal breath sounds.  Abdominal:     Palpations: Abdomen is soft.     Tenderness: There is no abdominal tenderness.  Genitourinary:    Rectum: Guaiac result negative. No mass, tenderness or external hemorrhoid.     Comments: Light colored stool on glove Musculoskeletal:     Cervical back: Normal range of motion and neck supple.  Skin:    General: Skin is warm and dry.      Coloration: Skin is pale.  Neurological:     Mental Status: He is alert.  Psychiatric:        Attention and Perception: Attention normal.        Mood and Affect: Mood is depressed. Affect is tearful.        Speech: Speech normal.        Thought Content: Thought content includes suicidal ideation. Thought content does not include homicidal ideation. Thought content includes suicidal plan. Thought content does not include homicidal plan.    ED Results / Procedures / Treatments  Labs (all labs ordered are listed, but only abnormal results are displayed) Labs Reviewed  COMPREHENSIVE METABOLIC PANEL - Abnormal; Notable for the following components:      Result Value   Chloride 118 (*)    CO2 21 (*)    BUN 24 (*)    Calcium 8.0 (*)    Total Protein 5.7 (*)    Albumin 2.6 (*)    Alkaline Phosphatase 128 (*)    Anion gap 3 (*)    All other components within normal limits  CBC WITH DIFFERENTIAL/PLATELET - Abnormal; Notable for the following components:   RBC 3.07 (*)    Hemoglobin 5.3 (*)    HCT 21.6 (*)    MCV 70.4 (*)    MCH 17.3 (*)    MCHC 24.5 (*)    RDW 20.2 (*)    All other components within normal limits  ACETAMINOPHEN LEVEL - Abnormal; Notable for the following components:   Acetaminophen (Tylenol), Serum <10 (*)    All other components within normal limits  SALICYLATE LEVEL - Abnormal; Notable for the following components:   Salicylate Lvl <7.0 (*)    All other components within normal limits  PROTIME-INR - Abnormal; Notable for the following components:   Prothrombin Time 15.7 (*)    INR 1.3 (*)    All other components within normal limits  IRON AND TIBC - Abnormal; Notable for the following components:   Iron 16 (*)    Saturation Ratios 5 (*)    All other components within normal limits  FERRITIN - Abnormal; Notable for the following components:   Ferritin 2 (*)    All other components within normal limits  RETICULOCYTES - Abnormal; Notable for the following  components:   RBC. 3.20 (*)    All other components within normal limits  RESP PANEL BY RT-PCR (FLU A&B, COVID) ARPGX2  ETHANOL  RAPID URINE DRUG SCREEN, HOSP PERFORMED  VITAMIN B12  FOLATE  POC OCCULT BLOOD, ED  TYPE AND SCREEN  PREPARE RBC (CROSSMATCH)    EKG None  Radiology No results found.  Procedures Procedures   Medications Ordered in ED Medications - No data to display  ED Course  I have reviewed the triage vital signs and the nursing notes.  Pertinent labs & imaging results that were available during my care of the patient were reviewed by me and considered in my medical decision making (see chart for details).  Patient seen and examined. Work-up initiated. Will need medical clearance 2/2 underlying comorbid conditions and poor follow-up from gastric bypass.   Vital signs reviewed and are as follows: BP 135/82 (BP Location: Left Arm)   Pulse 88   Temp 98.8 F (37.1 C) (Oral)   Resp 19   Ht 5\' 9"  (1.753 m)   Wt 136 kg   SpO2 100%   BMI 44.28 kg/m   Medical evaluation demonstrated microcytic anemia with hemoglobin of 5.3.  Discussed with the patient and he agrees to admission to the hospital and blood transfusion.  2 units ordered.  Rectal exam performed with RN chaperone.  No grossly bloody or melanotic stool.  Patient will need psych evaluation as inpatient.  Discussed with Dr. who will see patient.  CRITICAL CARE Performed by: Rachael Darby PA-C Total critical care time: 40 minutes Critical care time was exclusive of separately billable procedures and treating other patients. Critical care was necessary to treat or prevent imminent or life-threatening deterioration. Critical care was time spent personally by me  on the following activities: development of treatment plan with patient and/or surrogate as well as nursing, discussions with consultants, evaluation of patient's response to treatment, examination of patient, obtaining history from  patient or surrogate, ordering and performing treatments and interventions, ordering and review of laboratory studies, ordering and review of radiographic studies, pulse oximetry and re-evaluation of patient's condition.      MDM Rules/Calculators/A&P                           Admit to hospital.    Final Clinical Impression(s) / ED Diagnoses Final diagnoses:  Symptomatic anemia  Suicidal ideation  Deliberate self-cutting    Rx / DC Orders ED Discharge Orders     None        Desmond Dike 09/25/20 2017    Terrilee Files, MD 09/26/20 1050

## 2020-09-25 NOTE — ED Triage Notes (Signed)
Patient states "thoughts of killing myself by rolling in traffic, harassing cops until they shoot me, and overdosing due to my pain". Patient has superficial cuts to right forearm. Hx of bipolar, MDD, anxiety, PTSD.

## 2020-09-25 NOTE — ED Notes (Signed)
Patient cutting left forearm with wire out of spo2 finger probe, removed from patient and pt states you returned to soon, bleeding noted. Superficial abrasions.
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
## 2020-09-26 DIAGNOSIS — F0631 Mood disorder due to known physiological condition with depressive features: Secondary | ICD-10-CM

## 2020-09-26 DIAGNOSIS — F3181 Bipolar II disorder: Secondary | ICD-10-CM

## 2020-09-26 DIAGNOSIS — R45851 Suicidal ideations: Secondary | ICD-10-CM

## 2020-09-26 LAB — BASIC METABOLIC PANEL
Anion gap: 4 — ABNORMAL LOW (ref 5–15)
BUN: 25 mg/dL — ABNORMAL HIGH (ref 6–20)
CO2: 22 mmol/L (ref 22–32)
Calcium: 8 mg/dL — ABNORMAL LOW (ref 8.9–10.3)
Chloride: 118 mmol/L — ABNORMAL HIGH (ref 98–111)
Creatinine, Ser: 0.98 mg/dL (ref 0.61–1.24)
GFR, Estimated: >60 mL/min (ref >60)
Glucose, Bld: 87 mg/dL (ref 70–99)
Potassium: 4.2 mmol/L (ref 3.5–5.1)
Sodium: 144 mmol/L (ref 135–145)

## 2020-09-26 LAB — CBC
HCT: 22.3 % — ABNORMAL LOW (ref 39.0–52.0)
Hemoglobin: 6 g/dL — CL (ref 13.0–17.0)
MCH: 19.5 pg — ABNORMAL LOW (ref 26.0–34.0)
MCHC: 26.9 g/dL — ABNORMAL LOW (ref 30.0–36.0)
MCV: 72.6 fL — ABNORMAL LOW (ref 80.0–100.0)
Platelets: 125 10*3/uL — ABNORMAL LOW (ref 150–400)
RBC: 3.07 MIL/uL — ABNORMAL LOW (ref 4.22–5.81)
RDW: 22 % — ABNORMAL HIGH (ref 11.5–15.5)
WBC: 4.2 10*3/uL (ref 4.0–10.5)
nRBC: 0 % (ref 0.0–0.2)

## 2020-09-26 LAB — PREPARE RBC (CROSSMATCH)

## 2020-09-26 LAB — HEMOGLOBIN AND HEMATOCRIT, BLOOD
HCT: 32.6 % — ABNORMAL LOW (ref 39.0–52.0)
Hemoglobin: 9.4 g/dL — ABNORMAL LOW (ref 13.0–17.0)

## 2020-09-26 LAB — HIV ANTIBODY (ROUTINE TESTING W REFLEX): HIV Screen 4th Generation wRfx: NONREACTIVE

## 2020-09-26 MED ORDER — PANTOPRAZOLE SODIUM 40 MG IV SOLR
40.0000 mg | Freq: Two times a day (BID) | INTRAVENOUS | Status: DC
  Administered 2020-09-26 – 2020-09-28 (×4): 40 mg via INTRAVENOUS
  Filled 2020-09-26 (×5): qty 40

## 2020-09-26 MED ORDER — SODIUM CHLORIDE 0.9% IV SOLUTION: Freq: Once | INTRAVENOUS | Status: DC

## 2020-09-26 MED ORDER — THIAMINE HCL 100 MG PO TABS
100.0000 mg | ORAL_TABLET | Freq: Every day | ORAL | Status: DC
  Administered 2020-09-26 – 2020-09-29 (×4): 100 mg via ORAL
  Filled 2020-09-26 (×4): qty 1

## 2020-09-26 MED ORDER — DIPHENHYDRAMINE HCL 25 MG PO CAPS
25.0000 mg | ORAL_CAPSULE | Freq: Every evening | ORAL | Status: DC | PRN
  Administered 2020-09-26: 25 mg via ORAL
  Filled 2020-09-26: qty 1

## 2020-09-26 MED ORDER — HYDROMORPHONE HCL 1 MG/ML IJ SOLN
1.0000 mg | INTRAMUSCULAR | Status: DC | PRN
  Administered 2020-09-26 – 2020-09-28 (×6): 1 mg via INTRAVENOUS
  Filled 2020-09-26 (×6): qty 1

## 2020-09-26 NOTE — Progress Notes (Signed)
1st unit of PRBC stopped at 0000 hrs even. 2nd unit initiated, per order, at 0025 hrs.
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
## 2020-09-26 NOTE — Consult Note (Signed)
Referring Provider: Dr. Hanley Ben Primary Care Physician:  Pcp, No Primary Gastroenterologist:  UNASSIGNED  Reason for Consultation:  Anemia; Heme positive  HPI: Zachary [name] is a 50 y.o. 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 Hgb 5.3 with heme positive stool. Denies visible rectal bleeding. Denies N/V/abdominal pain. Unintentional 50 pound weight loss in the past 5 months. Has not had an EGD/colonoscopy. Hungry. Sitter in room.  Past Medical History:  Diagnosis Date   Anxiety    Arthritis    Bipolar 1 disorder, depressed (HCC)    Fibromyalgia    Hx of degenerative disc disease    Obesity    PTSD (post-traumatic stress disorder)     Past Surgical History:  Procedure Laterality Date   ADENOIDECTOMY     APPENDECTOMY     CHOLECYSTECTOMY     GASTRIC BYPASS     TYMPANOSTOMY TUBE PLACEMENT      Prior to Admission medications   Medication Sig Start Date End Date Taking? Authorizing Provider  acetaminophen (TYLENOL) 500 MG tablet Take 1,000 mg by mouth every 6 (six) hours as needed for moderate pain.   Yes [provider]  calcium-vitamin D (OSCAL 500/200 D-3) 500-200 MG-UNIT tablet Take 2 tablets 2 (two) times daily by mouth. 11/11/16 09/25/21 Yes Zigmund Daniel., MD  ferrous sulfate 325 (65 FE) MG tablet Take 1 tablet (325 mg total) by mouth daily with breakfast. 09/02/14  Yes Thermon Leyland, NP  gabapentin (NEURONTIN) 300 MG capsule Take 300 mg by mouth 3 (three) times daily. 09/09/20  Yes [provider]  methocarbamol (ROBAXIN) 500 MG tablet Take 1,000 mg by mouth at bedtime. 09/14/20  Yes [provider]  Potassium 99 MG TABS Take 99 mg by mouth 2 (two) times daily. for muscle spasms   Yes [provider]  clonazePAM (KLONOPIN) 1 MG tablet Take 2 mg by mouth every evening. 09/14/20   [provider]  mirtazapine (REMERON SOL-TAB) 15 MG  disintegrating tablet Take 1 tablet (15 mg total) at bedtime by mouth. For mood control Patient not taking: Reported on 09/25/2020 11/14/16   Money, Gerlene Burdock, FNP  risperiDONE (RISPERDAL M-TABS) 1 MG disintegrating tablet Take 1 tablet (1 mg total) at bedtime by mouth. Mood control Patient not taking: Reported on 09/25/2020 11/14/16   Money, Gerlene Burdock, FNP    Scheduled Meds:  sodium chloride   Intravenous Once   pantoprazole (PROTONIX) IV  40 mg Intravenous Q12H   sodium chloride flush  3 mL Intravenous Q12H   Continuous Infusions:  sodium chloride     PRN Meds:.sodium chloride, acetaminophen **OR** acetaminophen, HYDROmorphone (DILAUDID) injection, ondansetron **OR** ondansetron (ZOFRAN) IV, sodium chloride flush, traMADol  Allergies as of 09/25/2020 - Review Complete 09/25/2020  Allergen Reaction Noted   Capsaicin Other (See Comments) 08/26/2014   Codeine Itching 12/31/2012   Latex Itching and Rash 11/04/2016    Family History  Problem Relation Age of Onset   Diabetes Father    Cancer Father     Social History   Socioeconomic History   Marital status: Single    Spouse name: Not on file   Number of children: Not on file   Years of education: Not on file   Highest education level: Not on file  Occupational History   Not on file  Tobacco Use   Smoking status: Never   Smokeless tobacco: Never  Substance and Sexual Activity  Alcohol use: No    Comment: none   Drug use: No   Sexual activity: Yes    Birth control/protection: Condom  Other Topics Concern   Not on file  Social History Narrative   Not on file   Social Determinants of Health   Financial Resource Strain: Not on file  Food Insecurity: Not on file  Transportation Needs: Not on file  Physical Activity: Not on file  Stress: Not on file  Social Connections: Not on file  Intimate Partner Violence: Not on file    Review of Systems: All negative except as stated above in HPI.  Physical Exam: Vital  signs: Vitals:   09/26/20 1206 09/26/20 1245  BP: 140/70 133/74  Pulse: 73 64  Resp: 18 18  Temp: 98.6 F (37 C) 98.3 F (36.8 C)  SpO2: 100%    Last BM Date: 09/26/20 General:   Lethargic, obese, no acute distress  Head: normocephalic, atraumatic Eyes: anicteric sclera ENT: oropharynx clear Neck: supple, nontender Lungs:  Clear throughout to auscultation.   No wheezes, crackles, or rhonchi. No acute distress. Heart:  Regular rate and rhythm; no murmurs, clicks, rubs,  or gallops. Abdomen: soft, nontender, nondistended, +BS  Rectal:  Deferred Ext: no edema; superficial lacerations on right forearm  GI:  Lab Results: Recent Labs    09/25/20 1755 09/26/20 0506  WBC 5.4 4.2  HGB 5.3* 6.0*  HCT 21.6* 22.3*  PLT 162 125*   BMET Recent Labs    09/25/20 1755 09/26/20 0506  NA 142 144  K 4.4 4.2  CL 118* 118*  CO2 21* 22  GLUCOSE 87 87  BUN 24* 25*  CREATININE 1.07 0.98  CALCIUM 8.0* 8.0*   LFT Recent Labs    09/25/20 1755  PROT 5.7*  ALBUMIN 2.6*  AST 16  ALT 19  ALKPHOS 128*  BILITOT 0.5   PT/INR Recent Labs    09/25/20 1755  LABPROT 15.7*  INR 1.3*     Studies/Results: CT ABDOMEN PELVIS W CONTRAST  Result Date: 09/25/2020 CLINICAL DATA:  Abdominal pain, acute, nonlocalized unintended weight loss. Unintentional weight loss. Left inguinal mass EXAM: CT ABDOMEN AND PELVIS WITH CONTRAST TECHNIQUE: Multidetector CT imaging of the abdomen and pelvis was performed using the standard protocol following bolus administration of intravenous contrast. CONTRAST:  71mL OMNIPAQUE IOHEXOL 350 MG/ML SOLN COMPARISON:  None. FINDINGS: Lower chest: No acute abnormality. Hepatobiliary: Cholelithiasis noted without pericholecystic inflammatory change. No intra or extrahepatic biliary ductal dilation. Liver unremarkable. Pancreas: Unremarkable Spleen: The spleen is moderately enlarged measuring 16.8 cm in greatest dimension. No intrasplenic lesions are seen.  Adrenals/Urinary Tract: The adrenal glands are unremarkable. The kidneys are normal in size and position. Bilateral parapelvic renal cysts are noted. There is mild asymmetric right renal cortical atrophy. No hydronephrosis. No intrarenal or ureteral calculi. No enhancing intra cortical masses. The bladder is unremarkable. Stomach/Bowel: Surgical changes of gastric sleeve resection, appendectomy, and partial small-bowel resection are identified. No evidence of obstruction or focal inflammation. No free intraperitoneal gas or fluid. Vascular/Lymphatic: No pathologic adenopathy within the abdomen and pelvis. The abdominal vasculature is unremarkable. Reproductive: Prostate is unremarkable. Other: There is diffuse body wall subcutaneous edema. Tiny broad-based fat containing umbilical hernia. The rectum is unremarkable. Musculoskeletal: No acute bone abnormality. No lytic or blastic bone lesion. Degenerative changes seen within the lumbar spine. IMPRESSION: No acute intra-abdominal pathology identified. Cholelithiasis. Diffuse body wall subcutaneous edema. No abnormal subcutaneous soft tissue mass, lymphadenopathy, or fluid collection identified to account for the  patient's reported left inguinal palpable abnormality. Moderate splenomegaly Electronically Signed   By: Helyn Numbers M.D.   On: 09/25/2020 23:59    Impression/Plan: Symptomatic anemia with heme positive stool without overt bleeding. Duodenal switch puts him at risk for chronic anemia. Should have a colonoscopy/EGD prior to discharge and he states he does not need a colon prep because he is having loose stools. Explained that he still needs a colon prep and seems willing to drink it. Hgb 6 following 2 U PRBCs. Agree with repeat transfusion and if improved then prep tomorrow for EGD/colon on 09/28/20. Soft diet today and clear liquids tomorrow if pt willing to drink colon prep tomorrow afternoon. Will follow.    LOS: 1 day   [name]   09/26/2020, 2:32 PM  Questions please call [phone]

## 2020-09-26 NOTE — Progress Notes (Signed)
HGB only up to 6.0 this AM after 2 units of PRBC overnight. Audrea Muscat, NP notified of the critical value. 2 units PRBC ordered by NP.   1st of 2 ordered units of PRBC initiated at 0650 hrs.

## 2020-09-26 NOTE — Progress Notes (Signed)
Patient ID: Zachary [name], male   DOB: 1970/01/11, 50 y.o.   MRN: [id number]  PROGRESS NOTE    LORENCE NAGENGAST  JME:[id number] DOB: [birth year] DOA: 09/25/2020 PCP: Pcp, No   Brief Narrative:  50 y.o. male with medical history significant for biliopancreatic diversion with duodenal switch, anemia requiring blood transfusion, chronic back and lower extremity pain, PTSD, bipolar, depression who presented for evaluation of suicidal ideation.  He was found to have hemoglobin of 5.3 with FOBT positive.  Assessment & Plan:  Acute on chronic symptomatic anemia Iron deficiency anemia -Presented with hemoglobin of 5.3.  Prior hemoglobin in our system was in the 8's in 2018. -Status post 2 units packed red cell transfusion.  Hemoglobin subsequently only increased to 6.  Transfusing another 2 units packed red cells.  Monitor H&H. -FOBT positive on presentation.  Has a complex history of gastric bypass. -Increase Protonix to 40 mg IV every 12 hours.  Patient denies any black or bloody stools.  Consulted GI/Dr. Bosie Clos.  Follow recommendations.  Keep him n.p.o. for now.  Suicidal ideation -Continue Software engineer.  Await psychiatry evaluation.  Might need inpatient psychiatric hospitalization.  If patient threatens to leave, might have to IVC him  Morbid obesity status post gastric bypass in the past next-outpatient follow-up  Unintentional weight loss -CT of the abdomen and pelvis showed no acute intra-abdominal pathology  Cholelithiasis -Might need outpatient general surgery evaluation   DVT prophylaxis: SCDs Code Status: Full Family Communication: None at bedside Disposition Plan: Status is: Inpatient  Remains inpatient appropriate because:Inpatient level of care appropriate due to severity of illness  Dispo: The patient is from: Home              Anticipated d/c is to:  Might need inpatient psychiatric hospitalization              Patient currently is not medically stable to  d/c.   Difficult to place patient No   Consultants: Psychiatry/gastroenterology  Procedures: None  Antimicrobials: None   Subjective: Patient seen and examined at bedside.  Complains of intermittent back pain.  Intermittently gets angry and uses curse words.  No overnight fever, black or bloody stools reported.  Objective: Vitals:   09/26/20 0250 09/26/20 0631 09/26/20 0659 09/26/20 1006  BP: 121/72 123/82 (!) 143/88 (!) 142/81  Pulse: 69 (!) 54 75 68  Resp: 14 12 16 18   Temp: 97.6 F (36.4 C) (!) 97.5 F (36.4 C) 97.6 F (36.4 C) 97.9 F (36.6 C)  TempSrc: Oral Oral    SpO2: 98% 100%  97%  Weight:      Height:        Intake/Output Summary (Last 24 hours) at 09/26/2020 1033 Last data filed at 09/26/2020 1008 Gross per 24 hour  Intake 2442.43 ml  Output --  Net 2442.43 ml   Filed Weights   09/25/20 1733  Weight: 136 kg    Examination:  General exam: No distress.  Currently on room air. Respiratory system: Bilateral decreased breath sounds at bases Cardiovascular system: S1 & S2 heard, intermittently bradycardic Gastrointestinal system: Abdomen is morbidly obese, nondistended, soft and nontender. Normal bowel sounds heard. Extremities: No cyanosis, clubbing, edema  Central nervous system: Alert and oriented. No focal neurological deficits. Moving extremities Skin: No rashes, lesions or ulcers Psychiatry: Intermittently gets angry and uses curse words.   Data Reviewed: I have personally reviewed following labs and imaging studies  CBC: Recent Labs  Lab 09/25/20 1755 09/26/20 0506  WBC 5.4  4.2  NEUTROABS 3.8  --   HGB 5.3* 6.0*  HCT 21.6* 22.3*  MCV 70.4* 72.6*  PLT 162 125*   Basic Metabolic Panel: Recent Labs  Lab 09/25/20 1755 09/26/20 0506  NA 142 144  K 4.4 4.2  CL 118* 118*  CO2 21* 22  GLUCOSE 87 87  BUN 24* 25*  CREATININE 1.07 0.98  CALCIUM 8.0* 8.0*   GFR: Estimated Creatinine Clearance: 123.5 mL/min (by C-G formula based on SCr  of 0.98 mg/dL). Liver Function Tests: Recent Labs  Lab 09/25/20 1755  AST 16  ALT 19  ALKPHOS 128*  BILITOT 0.5  PROT 5.7*  ALBUMIN 2.6*   No results for input(s): LIPASE, AMYLASE in the last 168 hours. No results for input(s): AMMONIA in the last 168 hours. Coagulation Profile: Recent Labs  Lab 09/25/20 1755  INR 1.3*   Cardiac Enzymes: No results for input(s): CKTOTAL, CKMB, CKMBINDEX, TROPONINI in the last 168 hours. BNP (last 3 results) No results for input(s): PROBNP in the last 8760 hours. HbA1C: No results for input(s): HGBA1C in the last 72 hours. CBG: No results for input(s): GLUCAP in the last 168 hours. Lipid Profile: No results for input(s): CHOL, HDL, LDLCALC, TRIG, CHOLHDL, LDLDIRECT in the last 72 hours. Thyroid Function Tests: No results for input(s): TSH, T4TOTAL, FREET4, T3FREE, THYROIDAB in the last 72 hours. Anemia Panel: Recent Labs    09/25/20 1858  VITAMINB12 517  FOLATE 8.0  FERRITIN 2*  TIBC 312  IRON 16*  RETICCTPCT 1.5   Sepsis Labs: No results for input(s): PROCALCITON, LATICACIDVEN in the last 168 hours.  Recent Results (from the past 240 hour(s))  Resp Panel by RT-PCR (Flu A&B, Covid) Nasopharyngeal Swab     Status: None   Collection Time: 09/25/20  5:55 PM   Specimen: Nasopharyngeal Swab; Nasopharyngeal(NP) swabs in vial transport medium  Result Value Ref Range Status   SARS Coronavirus 2 by RT PCR NEGATIVE NEGATIVE Final    Comment: (NOTE) SARS-CoV-2 target nucleic acids are NOT DETECTED.  The SARS-CoV-2 RNA is generally detectable in upper respiratory specimens during the acute phase of infection. The lowest concentration of SARS-CoV-2 viral copies this assay can detect is 138 copies/mL. A negative result does not preclude SARS-Cov-2 infection and should not be used as the sole basis for treatment or other patient management decisions. A negative result may occur with  improper specimen collection/handling, submission of  specimen other than nasopharyngeal swab, presence of viral mutation(s) within the areas targeted by this assay, and inadequate number of viral copies(<138 copies/mL). A negative result must be combined with clinical observations, patient history, and epidemiological information. The expected result is Negative.  Fact Sheet for Patients:  BloggerCourse.com  Fact Sheet for Healthcare Providers:  SeriousBroker.it  This test is no t yet approved or cleared by the [place] FDA and  has been authorized for detection and/or diagnosis of SARS-CoV-2 by FDA under an Emergency Use Authorization (EUA). This EUA will remain  in effect (meaning this test can be used) for the duration of the COVID-19 declaration under Section 564(b)(1) of the Act, 21 U.S.C.section 360bbb-3(b)(1), unless the authorization is terminated  or revoked sooner.       Influenza A by PCR NEGATIVE NEGATIVE Final   Influenza B by PCR NEGATIVE NEGATIVE Final    Comment: (NOTE) The Xpert Xpress SARS-CoV-2/FLU/RSV plus assay is intended as an aid in the diagnosis of influenza from Nasopharyngeal swab specimens and should not be used as a sole  basis for treatment. Nasal washings and aspirates are unacceptable for Xpert Xpress SARS-CoV-2/FLU/RSV testing.  Fact Sheet for Patients: BloggerCourse.com  Fact Sheet for Healthcare Providers: SeriousBroker.it  This test is not yet approved or cleared by the [place] FDA and has been authorized for detection and/or diagnosis of SARS-CoV-2 by FDA under an Emergency Use Authorization (EUA). This EUA will remain in effect (meaning this test can be used) for the duration of the COVID-19 declaration under Section 564(b)(1) of the Act, 21 U.S.C. section 360bbb-3(b)(1), unless the authorization is terminated or revoked.  Performed at [facility], [address], [place], [place] [address]          Radiology Studies: CT ABDOMEN PELVIS W CONTRAST  Result Date: 09/25/2020 CLINICAL DATA:  Abdominal pain, acute, nonlocalized unintended weight loss. Unintentional weight loss. Left inguinal mass EXAM: CT ABDOMEN AND PELVIS WITH CONTRAST TECHNIQUE: Multidetector CT imaging of the abdomen and pelvis was performed using the standard protocol following bolus administration of intravenous contrast. CONTRAST:  80mL OMNIPAQUE IOHEXOL 350 MG/ML SOLN COMPARISON:  None. FINDINGS: Lower chest: No acute abnormality. Hepatobiliary: Cholelithiasis noted without pericholecystic inflammatory change. No intra or extrahepatic biliary ductal dilation. Liver unremarkable. Pancreas: Unremarkable Spleen: The spleen is moderately enlarged measuring 16.8 cm in greatest dimension. No intrasplenic lesions are seen. Adrenals/Urinary Tract: The adrenal glands are unremarkable. The kidneys are normal in size and position. Bilateral parapelvic renal cysts are noted. There is mild asymmetric right renal cortical atrophy. No hydronephrosis. No intrarenal or ureteral calculi. No enhancing intra cortical masses. The bladder is unremarkable. Stomach/Bowel: Surgical changes of gastric sleeve resection, appendectomy, and partial small-bowel resection are identified. No evidence of obstruction or focal inflammation. No free intraperitoneal gas or fluid. Vascular/Lymphatic: No pathologic adenopathy within the abdomen and pelvis. The abdominal vasculature is unremarkable. Reproductive: Prostate is unremarkable. Other: There is diffuse body wall subcutaneous edema. Tiny broad-based fat containing umbilical hernia. The rectum is unremarkable. Musculoskeletal: No acute bone abnormality. No lytic or blastic bone lesion. Degenerative changes seen within the lumbar spine. IMPRESSION: No acute intra-abdominal pathology identified. Cholelithiasis. Diffuse body wall subcutaneous edema. No abnormal  subcutaneous soft tissue mass, lymphadenopathy, or fluid collection identified to account for the patient's reported left inguinal palpable abnormality. Moderate splenomegaly Electronically Signed   By: Helyn Numbers M.D.   On: 09/25/2020 23:59        Scheduled Meds:  sodium chloride   Intravenous Once   pantoprazole (PROTONIX) IV  40 mg Intravenous Q12H   sodium chloride flush  3 mL Intravenous Q12H   Continuous Infusions:  sodium chloride            Glade Lloyd, MD [facility] 09/26/2020, 10:33 AM

## 2020-09-26 NOTE — Progress Notes (Signed)
Pt requesting to make a phone call to his girlfriend. Phone handed to pt, pt told of the overall rules of using the phone of the hospital- (1)2 phone calls, 5 minutes each, (2) phone must be visible to this Clinical research associate and (3) the phone will have to be taken away once he is done with the phone. Pt was very compliant about it and cooperative. Will continue to monitor pt.

## 2020-09-26 NOTE — Progress Notes (Signed)
Pt had a second phone call with girlfriend. Will continue to monitor pt.

## 2020-09-27 DIAGNOSIS — R451 Restlessness and agitation: Secondary | ICD-10-CM

## 2020-09-27 LAB — CBC WITH DIFFERENTIAL/PLATELET
Abs Immature Granulocytes: 0.02 10*3/uL (ref 0.00–0.07)
Basophils Absolute: 0 10*3/uL (ref 0.0–0.1)
Basophils Relative: 1 %
Eosinophils Absolute: 0 10*3/uL (ref 0.0–0.5)
Eosinophils Relative: 1 %
HCT: 28.8 % — ABNORMAL LOW (ref 39.0–52.0)
Hemoglobin: 8 g/dL — ABNORMAL LOW (ref 13.0–17.0)
Immature Granulocytes: 1 %
Lymphocytes Relative: 21 %
Lymphs Abs: 1 10*3/uL (ref 0.7–4.0)
MCH: 21.2 pg — ABNORMAL LOW (ref 26.0–34.0)
MCHC: 27.8 g/dL — ABNORMAL LOW (ref 30.0–36.0)
MCV: 76.2 fL — ABNORMAL LOW (ref 80.0–100.0)
Monocytes Absolute: 0.3 10*3/uL (ref 0.1–1.0)
Monocytes Relative: 6 %
Neutro Abs: 3.2 10*3/uL (ref 1.7–7.7)
Neutrophils Relative %: 70 %
Platelets: 118 10*3/uL — ABNORMAL LOW (ref 150–400)
RBC: 3.78 MIL/uL — ABNORMAL LOW (ref 4.22–5.81)
RDW: 22.3 % — ABNORMAL HIGH (ref 11.5–15.5)
WBC: 4.4 10*3/uL (ref 4.0–10.5)
nRBC: 0 % (ref 0.0–0.2)

## 2020-09-27 LAB — BASIC METABOLIC PANEL
Anion gap: 3 — ABNORMAL LOW (ref 5–15)
BUN: 22 mg/dL — ABNORMAL HIGH (ref 6–20)
CO2: 24 mmol/L (ref 22–32)
Calcium: 7.7 mg/dL — ABNORMAL LOW (ref 8.9–10.3)
Chloride: 112 mmol/L — ABNORMAL HIGH (ref 98–111)
Creatinine, Ser: 1 mg/dL (ref 0.61–1.24)
GFR, Estimated: >60 mL/min (ref >60)
Glucose, Bld: 97 mg/dL (ref 70–99)
Potassium: 4.1 mmol/L (ref 3.5–5.1)
Sodium: 139 mmol/L (ref 135–145)

## 2020-09-27 LAB — MAGNESIUM: Magnesium: 1.8 mg/dL (ref 1.7–2.4)

## 2020-09-27 LAB — TSH: TSH: 4.559 u[IU]/mL — ABNORMAL HIGH (ref 0.350–4.500)

## 2020-09-27 MED ORDER — BOOST / RESOURCE BREEZE PO LIQD CUSTOM: 1.0000 | Freq: Three times a day (TID) | ORAL | Status: DC

## 2020-09-27 MED ORDER — CALCIUM CARBONATE ANTACID 500 MG PO CHEW
1.0000 | CHEWABLE_TABLET | Freq: Four times a day (QID) | ORAL | Status: DC
  Administered 2020-09-27 – 2020-09-29 (×6): 200 mg via ORAL
  Filled 2020-09-27 (×6): qty 1

## 2020-09-27 MED ORDER — ADULT MULTIVITAMIN W/MINERALS CH
1.0000 | ORAL_TABLET | Freq: Two times a day (BID) | ORAL | Status: DC
  Administered 2020-09-27 – 2020-09-29 (×4): 1 via ORAL
  Filled 2020-09-27 (×4): qty 1

## 2020-09-27 MED ORDER — OLANZAPINE 5 MG PO TBDP
2.5000 mg | ORAL_TABLET | Freq: Three times a day (TID) | ORAL | Status: DC | PRN
  Filled 2020-09-27 (×2): qty 0.5

## 2020-09-27 MED ORDER — SODIUM CHLORIDE 0.9 % IV SOLN: INTRAVENOUS | Status: DC

## 2020-09-27 MED ORDER — PEG 3350-KCL-NA BICARB-NACL 420 G PO SOLR
4000.0000 mL | Freq: Once | ORAL | Status: AC
  Administered 2020-09-27: 4000 mL via ORAL

## 2020-09-27 MED ORDER — CLONAZEPAM 0.5 MG PO TBDP
0.5000 mg | ORAL_TABLET | Freq: Once | ORAL | Status: AC
  Administered 2020-09-27: 0.5 mg via ORAL
  Filled 2020-09-27: qty 1

## 2020-09-27 MED ORDER — CLONAZEPAM 0.5 MG PO TBDP
1.0000 mg | ORAL_TABLET | Freq: Every day | ORAL | Status: DC
  Administered 2020-09-28: 1 mg via ORAL
  Filled 2020-09-27: qty 2

## 2020-09-27 MED ORDER — PROSOURCE PLUS PO LIQD
30.0000 mL | Freq: Three times a day (TID) | ORAL | Status: DC
  Administered 2020-09-27: 30 mL via ORAL
  Filled 2020-09-27 (×3): qty 30

## 2020-09-27 MED ORDER — OLANZAPINE 5 MG PO TBDP
5.0000 mg | ORAL_TABLET | Freq: Every day | ORAL | Status: DC
  Administered 2020-09-27 – 2020-09-28 (×2): 5 mg via ORAL
  Filled 2020-09-27 (×2): qty 1

## 2020-09-27 NOTE — Progress Notes (Signed)
IVC paper work has been filled out by MD and faxed over to [employer]. LE has been called to serve pt IVC orders.  Valentina Shaggy.Darrion Wyszynski, MSW, LCSWA [facility]  Transitions of Care Clinical Social Worker I Direct Dial: [phone]  Fax: [phone] [email or web address]

## 2020-09-27 NOTE — Consult Note (Addendum)
[name] Followup Psychiatric Evaluation   Service Date: September 27, 2020 LOS:  LOS: 2 days      Assessment  Zachary [name] is a 50 y.o. male admitted medically for 09/25/2020  5:18 PM for a suicide attempt; was admitted for severe anemia. He carries the psychiatric diagnoses of major depressive disorder and has an extensive past medical history of  degenerative disk disease and gastric bypass.Psychiatry was consulted for his suicide attempt.     His current presentation of chronic nonreactive low mood, poor sleep, poor appetite, anhedonia, and suicidality is most consistent with depression. He describes episodes consistent with hypomania; unclear if he meets full criteria for bipolar I disorder. He has many personality traits seen in patients with borderline personality disorder (all or nothing thinking, splitting staff) however these traits are often worsened in the hospital setting and I am not making a formal diagnosis at this time.  Current outpatient psychotropic medications include nothing and historically he has had a poor response to medications. He was difficult to engage in discussion about starting a medication, offering rebuttals to several medications suggested. On initial examination, patient endorses ongoing suicidality and a desire for inpatient hospitalizaiton.    9/25: He initially denied any home medications; further chart review and conversation revealed that he does have a prescription for home klonopin 1 mg PO qD; being that he is agitated will OTO a small dose for now and schedule this standing QHS to prevent withdrawal (physiologic withdrawal unlikely at low dose - some of pt's agitation likely 2/2 to this).  Will also put in standing medication for bipolar disorder (Olanzapine 5 QHS) and PRN for agitation (olanzapine 2.5). Would plan to add fluoxetine to this outpt however will defer until after GI bleed r/o.[name] Please see plan below for detailed  recommendations.    Diagnoses:  Active Hospital problems: Principal Problem:   Iron deficiency anemia Active Problems:   Morbid obesity (HCC)   Suicidal ideation   Unintentional weight loss    Problems edited/added by me: No problems updated.  Plan  ## Safety and Observation Level:  - Based on my clinical evaluation, I estimate the patient to be at high risk in the current setting - At this time, we recommend sitter and 1:1. We also recommend IVC given pt's suicidality and persistent threats to leave AMA.  This decision is based on my review of the chart including patient's history and current presentation, interview of the patient, mental status examination, and consideration of suicide risk including evaluating suicidal ideation, plan, intent, suicidal or self-harm behaviors, risk factors, and protective factors. This judgment is based on our ability to directly address suicide risk, implement suicide prevention strategies and develop a safety plan while the patient is in the clinical setting. Please contact our team if there is a concern that risk level has changed. - please IVC pt if he attempts to leave  ## Medications:  STANDING -- OTO clonazepam 0.5 PO mg (home med, last took Thursday, pt anxious/agitated/prevent withdrawal) -- klonopin 1 mg PO QHS (home med, anxiety, insomnia)  -- olanzapine 5 mg  QHS (bipolar, insomnia, agitation)- zydis given h/o gastric bypass   PRN - olanzapine 2.5 mg q8 PO PRN agitation - zydis as above.  ## Medical Decision Making Capacity:  Not formally assessed   ## Further Work-up: - add lipid profile/a1c on to labs  - please get vitamin D, consider copper level (hx gastric bypass, neuropsych sx)  -- TSH wnl --  B12 wnl, previously incorrectly noted as low -- iron stores low Folate wnl   -- HIV (-),   ## Disposition:  To inpt psych when medically clear  ## Behavioral / Environmental:  -- Utilize compassion and acknowledge the  patient's experiences while setting clear and realistic expectations for care. -- To minimize splitting of staff, assign one staff person to communicate all information from the team when feasible.   Thank you for this consult request. Recommendations have been communicated to the primary team.  We will continue to  at this time.   [name] [name]    followup history  Relevant Aspects of Hospital Course:  Admitted on 09/25/2020 for iron deficiency anemia in the setting of suicide attempt. Has been generally engaging in help-rejecting complaining behavior (came to hospital for SI, threatening to leave AMA, refused all psychotropic meds offered, complaining of not getting home meds, stating that he didn't care about home meds this AM intermittently refusing EGD) and splitting of staff since admission.   Patient Report:  Patient seen, head resting on table, appears despondent/dejected. Much of content of conversation directed at dislike for primary team and distrust of their recommendations. I discussed his threats to nursing staff which he said was a "joke"; discussed that this was inappropriate and would not be tolerated. He expressed ongoing suicidal ideations. I discussed medication recommendations as above - pt stated that he did not care about medications (see overnight nursing note). I strongly recommended he get scope to work toward eventual goal of inpatient psychiatric hospitalization.  He deflected when gently confronted about stating he had no home medications yesterday, stating he meant he had had no meds since Thursday. Consented to have girlfriend called.    Collateral: Called girlfriend to discuss care thus far with patient's consent. Before he came to the hospital he has been really getting worn out easily, tired, lethargic, in a lot of pain, a week ago he took a really nasty fall and his pain has been worse since. He is scheduled for a disability hearing in November. She  confirms 30-40lb weight loss in the last couple of months. She is worried about him both medically and psychiatrically. As he doesn't have insurance or an income he feels very trapped. He does sometimes cut but his recent cutting was much more extensive than usual. He has been making parasuicidal statements to his girlfriend. She is unsure if recent cutting was a suicide attempt due to the angle of the cuts but doesn't think it was very far off from a suicide attempt. Discussed IVC with family.    ROS:  Largely significant for MSK pain, insomnia   Psychiatric History:  See initial consult note.   Medical History: Past Medical History:  Diagnosis Date  . Anxiety   . Arthritis   . Bipolar 1 disorder, depressed (HCC)   . Fibromyalgia   . Hx of degenerative disc disease   . Obesity   . PTSD (post-traumatic stress disorder)     Surgical History: Past Surgical History:  Procedure Laterality Date  . ADENOIDECTOMY    . APPENDECTOMY    . CHOLECYSTECTOMY    . GASTRIC BYPASS    . TYMPANOSTOMY TUBE PLACEMENT      Medications:   Current Facility-Administered Medications:  .  0.9 %  sodium chloride infusion (Manually program via Guardrails IV Fluids), , Intravenous, Once, Blount, Xenia T, NP .  0.9 %  sodium chloride infusion, 250 mL, Intravenous, PRN, Chotiner, Claudean Severance, MD .  acetaminophen (TYLENOL) tablet 650 mg, 650 mg, Oral, Q6H PRN, 650 mg at 09/26/20 2200 **OR** acetaminophen (TYLENOL) suppository 650 mg, 650 mg, Rectal, Q6H PRN, Chotiner, Claudean Severance, MD .  diphenhydrAMINE (BENADRYL) capsule 25 mg, 25 mg, Oral, QHS PRN, Blount, Xenia T, NP .  HYDROmorphone (DILAUDID) injection 1 mg, 1 mg, Intravenous, Q3H PRN, Hanley Ben, Kshitiz, MD, 1 mg at 09/27/20 0450 .  ondansetron (ZOFRAN) tablet 4 mg, 4 mg, Oral, Q6H PRN **OR** ondansetron (ZOFRAN) injection 4 mg, 4 mg, Intravenous, Q6H PRN, Chotiner, Claudean Severance, MD .  pantoprazole (PROTONIX) injection 40 mg, 40 mg, Intravenous, Q12H, Alekh,  Kshitiz, MD, 40 mg at 09/26/20 1211 .  sodium chloride flush (NS) 0.9 % injection 3 mL, 3 mL, Intravenous, Q12H, Chotiner, Claudean Severance, MD, 3 mL at 09/26/20 2200 .  sodium chloride flush (NS) 0.9 % injection 3 mL, 3 mL, Intravenous, PRN, Chotiner, Claudean Severance, MD .  thiamine tablet 100 mg, 100 mg, Oral, Daily, Angelyne Terwilliger A, 100 mg at 09/26/20 2143 .  traMADol (ULTRAM) tablet 50 mg, 50 mg, Oral, Q6H PRN, Chotiner, Claudean Severance, MD  Allergies: Allergies  Allergen Reactions  . Capsaicin Other (See Comments)    Burning on skin - causes blisters immediately   . Codeine Itching  . Latex Itching and Rash    Social History: Lives with girlfriend  Family History:  The patient's family history includes Cancer in his father; Diabetes in his father.    Objective  Vital signs:  Temp:  [97.9 F (36.6 C)-98.7 F (37.1 C)] 98.6 F (37 C) (09/25 0457) Pulse Rate:  [63-73] 69 (09/25 0457) Resp:  [15-18] 15 (09/25 0457) BP: (128-152)/(70-81) 128/70 (09/25 0457) SpO2:  [97 %-100 %] 98 % (09/25 0457)  Physical Exam: General: Obese, dejected appearing, no episodes of writhing in pain appreciated. Pulm: no increased work of breathing Eyes: Sclera anicteric Neuro: CNII-XII grossly intact  Mental status exam On my mental status examination presently, the patient is alert, attentive, and grossly oriented to person, place, and time.  Appearance: Upset,.  Speech spontaneous and normal in volume, rate, and has reduced prosody compared to yesterday. Continues to make provacative comments (about suicide, refusing EGD, biting nursing staff) and checks for reaction in listener.  Mood:  Bad.  Affect:  dejected.  Thought process:  Linear associations, normal rate, logical.  Thought content is significant for suicidal content, devoid of homicidal or delusional content. Some mild paranoia to medical team. There are no evident or endorsed hallucinations.  Judgment is poor.  Insight is poor.  Recent and remote  memory:  Grossly intact.  Language is intact.  Fund of knowledge:  Grossly intact.

## 2020-09-27 NOTE — Progress Notes (Signed)
Patient ID: Zachary [name], male   DOB: Jan 22, 1970, 50 y.o.   MRN: [id number]  PROGRESS NOTE    Zachary [name]  GBT:[id number] DOB: 1970/10/25 DOA: 09/25/2020 PCP: Pcp, No   Brief Narrative:  50 y.o. male with medical history significant for biliopancreatic diversion with duodenal switch, anemia requiring blood transfusion, chronic back and lower extremity pain, PTSD, bipolar, depression who presented for evaluation of suicidal ideation.  He was found to have hemoglobin of 5.3 with FOBT positive.  Psychiatry and GI were consulted.  Assessment & Plan:  Acute on chronic symptomatic anemia Iron deficiency anemia -Presented with hemoglobin of 5.3.  Prior hemoglobin in our system was in the 8's in 2018. -Status post 4 units packed red cell transfusion.  Hemoglobin 8 today.  Monitor H&H. -FOBT positive on presentation.  Has a complex history of gastric bypass. -Continue IV Protonix.  Patient denies any black or bloody stools.   -GI planning for possible EGD/colonoscopy tomorrow.  As per nursing staff, patient is intermittently refusing to have these procedures done.  We will await further recommendations from GI.  Suicidal ideation -Continue Software engineer.   - Psychiatry following: Will need inpatient psychiatric hospitalization once medically stable. -Patient intermittently getting agitated, threatening to leave and cursing at medical staff including myself and making fake accusations.  He stated regarding me "this doctor called me a drug addict.  I do not want to see him".  I have never called him a drug addict.  He was on IV Dilaudid yesterday and he was asking for something else along with IV Dilaudid for his pain and I had inquired him if he takes any pain medications at home and he said that he was not on any.  He was n.p.o. yesterday morning and I had said that I would only use IV Dilaudid for now.  He had started to get angry and agitated at that time as well and I had walked out of the  room.  Morbid obesity status post gastric bypass in the past  -outpatient follow-up  Unintentional weight loss -CT of the abdomen and pelvis showed no acute intra-abdominal pathology  Cholelithiasis -Might need outpatient general surgery evaluation  Chronic back pain -Apparently has chronic back pain.  Continue current pain medications.   DVT prophylaxis: SCDs Code Status: Full Family Communication: None at bedside Disposition Plan: Status is: Inpatient  Remains inpatient appropriate because:Inpatient level of care appropriate due to severity of illness  Dispo: The patient is from: Home              Anticipated d/c is to: Inpatient psychiatric hospital              Patient currently is not medically stable to d/c.   Difficult to place patient No   Consultants: Psychiatry/gastroenterology  Procedures: None  Antimicrobials: None   Subjective: I walked into the room.  Patient kept repeating "I do not want to see you, leave the room".  I told him that it does not work like that and he threatened to walk out of the hospital and walked out of the room into the hallway.  Security personnel spoke to him and brought him back to the room.  Objective: Vitals:   09/26/20 1245 09/26/20 1547 09/26/20 2043 09/27/20 0457  BP: 133/74 128/71 (!) 152/73 128/70  Pulse: 64 64 63 69  Resp: 18 16 18 15   Temp:   98.7 F (37.1 C) 98.6 F (37 C)  TempSrc:  Oral  SpO2:  100% 99% 98%  Weight:      Height:        Intake/Output Summary (Last 24 hours) at 09/27/2020 0831 Last data filed at 09/26/2020 1558 Gross per 24 hour  Intake 2010 ml  Output --  Net 2010 ml    Filed Weights   09/25/20 1733  Weight: 136 kg    Examination:  He did not allow me to examine him but he intermittently became very agitated, shouting and voices.   Nursing staff indicated that he had made threatening remarks towards the nursing staff as well.  Data Reviewed: I have personally reviewed following  labs and imaging studies  CBC: Recent Labs  Lab 09/25/20 1755 09/26/20 0506 09/26/20 1711 09/27/20 0444  WBC 5.4 4.2  --  4.4  NEUTROABS 3.8  --   --  3.2  HGB 5.3* 6.0* 9.4* 8.0*  HCT 21.6* 22.3* 32.6* 28.8*  MCV 70.4* 72.6*  --  76.2*  PLT 162 125*  --  118*    Basic Metabolic Panel: Recent Labs  Lab 09/25/20 1755 09/26/20 0506 09/27/20 0444  NA 142 144 139  K 4.4 4.2 4.1  CL 118* 118* 112*  CO2 21* 22 24  GLUCOSE 87 87 97  BUN 24* 25* 22*  CREATININE 1.07 0.98 1.00  CALCIUM 8.0* 8.0* 7.7*  MG  --   --  1.8    GFR: Estimated Creatinine Clearance: 121 mL/min (by C-G formula based on SCr of 1 mg/dL). Liver Function Tests: Recent Labs  Lab 09/25/20 1755  AST 16  ALT 19  ALKPHOS 128*  BILITOT 0.5  PROT 5.7*  ALBUMIN 2.6*    No results for input(s): LIPASE, AMYLASE in the last 168 hours. No results for input(s): AMMONIA in the last 168 hours. Coagulation Profile: Recent Labs  Lab 09/25/20 1755  INR 1.3*    Cardiac Enzymes: No results for input(s): CKTOTAL, CKMB, CKMBINDEX, TROPONINI in the last 168 hours. BNP (last 3 results) No results for input(s): PROBNP in the last 8760 hours. HbA1C: No results for input(s): HGBA1C in the last 72 hours. CBG: No results for input(s): GLUCAP in the last 168 hours. Lipid Profile: No results for input(s): CHOL, HDL, LDLCALC, TRIG, CHOLHDL, LDLDIRECT in the last 72 hours. Thyroid Function Tests: Recent Labs    09/27/20 0444  TSH 4.559*   Anemia Panel: Recent Labs    09/25/20 1858  VITAMINB12 517  FOLATE 8.0  FERRITIN 2*  TIBC 312  IRON 16*  RETICCTPCT 1.5    Sepsis Labs: No results for input(s): PROCALCITON, LATICACIDVEN in the last 168 hours.  Recent Results (from the past 240 hour(s))  Resp Panel by RT-PCR (Flu A&B, Covid) Nasopharyngeal Swab     Status: None   Collection Time: 09/25/20  5:55 PM   Specimen: Nasopharyngeal Swab; Nasopharyngeal(NP) swabs in vial transport medium  Result Value Ref  Range Status   SARS Coronavirus 2 by RT PCR NEGATIVE NEGATIVE Final    Comment: (NOTE) SARS-CoV-2 target nucleic acids are NOT DETECTED.  The SARS-CoV-2 RNA is generally detectable in upper respiratory specimens during the acute phase of infection. The lowest concentration of SARS-CoV-2 viral copies this assay can detect is 138 copies/mL. A negative result does not preclude SARS-Cov-2 infection and should not be used as the sole basis for treatment or other patient management decisions. A negative result may occur with  improper specimen collection/handling, submission of specimen other than nasopharyngeal swab, presence of viral mutation(s) within the areas  targeted by this assay, and inadequate number of viral copies(<138 copies/mL). A negative result must be combined with clinical observations, patient history, and epidemiological information. The expected result is Negative.  Fact Sheet for Patients:  BloggerCourse.com  Fact Sheet for Healthcare Providers:  SeriousBroker.it  This test is no t yet approved or cleared by the [place] FDA and  has been authorized for detection and/or diagnosis of SARS-CoV-2 by FDA under an Emergency Use Authorization (EUA). This EUA will remain  in effect (meaning this test can be used) for the duration of the COVID-19 declaration under Section 564(b)(1) of the Act, 21 U.S.C.section 360bbb-3(b)(1), unless the authorization is terminated  or revoked sooner.       Influenza A by PCR NEGATIVE NEGATIVE Final   Influenza B by PCR NEGATIVE NEGATIVE Final    Comment: (NOTE) The Xpert Xpress SARS-CoV-2/FLU/RSV plus assay is intended as an aid in the diagnosis of influenza from Nasopharyngeal swab specimens and should not be used as a sole basis for treatment. Nasal washings and aspirates are unacceptable for Xpert Xpress SARS-CoV-2/FLU/RSV testing.  Fact Sheet for  Patients: BloggerCourse.com  Fact Sheet for Healthcare Providers: SeriousBroker.it  This test is not yet approved or cleared by the [place] FDA and has been authorized for detection and/or diagnosis of SARS-CoV-2 by FDA under an Emergency Use Authorization (EUA). This EUA will remain in effect (meaning this test can be used) for the duration of the COVID-19 declaration under Section 564(b)(1) of the Act, 21 U.S.C. section 360bbb-3(b)(1), unless the authorization is terminated or revoked.  Performed at [facility], [address] [place], [place] [address]           Radiology Studies: CT ABDOMEN PELVIS W CONTRAST  Result Date: 09/25/2020 CLINICAL DATA:  Abdominal pain, acute, nonlocalized unintended weight loss. Unintentional weight loss. Left inguinal mass EXAM: CT ABDOMEN AND PELVIS WITH CONTRAST TECHNIQUE: Multidetector CT imaging of the abdomen and pelvis was performed using the standard protocol following bolus administration of intravenous contrast. CONTRAST:  4mL OMNIPAQUE IOHEXOL 350 MG/ML SOLN COMPARISON:  None. FINDINGS: Lower chest: No acute abnormality. Hepatobiliary: Cholelithiasis noted without pericholecystic inflammatory change. No intra or extrahepatic biliary ductal dilation. Liver unremarkable. Pancreas: Unremarkable Spleen: The spleen is moderately enlarged measuring 16.8 cm in greatest dimension. No intrasplenic lesions are seen. Adrenals/Urinary Tract: The adrenal glands are unremarkable. The kidneys are normal in size and position. Bilateral parapelvic renal cysts are noted. There is mild asymmetric right renal cortical atrophy. No hydronephrosis. No intrarenal or ureteral calculi. No enhancing intra cortical masses. The bladder is unremarkable. Stomach/Bowel: Surgical changes of gastric sleeve resection, appendectomy, and partial small-bowel resection are identified. No evidence of  obstruction or focal inflammation. No free intraperitoneal gas or fluid. Vascular/Lymphatic: No pathologic adenopathy within the abdomen and pelvis. The abdominal vasculature is unremarkable. Reproductive: Prostate is unremarkable. Other: There is diffuse body wall subcutaneous edema. Tiny broad-based fat containing umbilical hernia. The rectum is unremarkable. Musculoskeletal: No acute bone abnormality. No lytic or blastic bone lesion. Degenerative changes seen within the lumbar spine. IMPRESSION: No acute intra-abdominal pathology identified. Cholelithiasis. Diffuse body wall subcutaneous edema. No abnormal subcutaneous soft tissue mass, lymphadenopathy, or fluid collection identified to account for the patient's reported left inguinal palpable abnormality. Moderate splenomegaly Electronically Signed   By: Helyn Numbers M.D.   On: 09/25/2020 23:59        Scheduled Meds:  sodium chloride   Intravenous Once   pantoprazole (PROTONIX) IV  40 mg Intravenous  Q12H   sodium chloride flush  3 mL Intravenous Q12H   thiamine  100 mg Oral Daily   Continuous Infusions:  sodium chloride            Glade Lloyd, MD [facility] 09/27/2020, 8:31 AM

## 2020-09-27 NOTE — Progress Notes (Signed)
This shift pt became very tearful and "filled with anxiety" because he states his home medication regimen is not in place and has not had THOSE MEDS for almost 3 days now. Attempted to calm patient with deep breathing exercises and environmental room changes. Also complained of insomnia, but then refused benadryl that this RN had ordered through hospitalist. Continues to complain of back pain and that he cant POP back. Pain medication given. Will continue to monitor

## 2020-09-27 NOTE — Progress Notes (Signed)
Initial Nutrition Assessment  RD working remotely.  DOCUMENTATION CODES:   Not applicable  INTERVENTION:   - Recommend obtaining thiamine, vitamin A, vitamin D, vitamin C, copper, and zinc labs to assess for deficiencies (MD would like this completed in the outpatient setting)  - MVI with with minerals BID given hx of BPD/DS  - Calcium carbonate 500 mg QID given hx of BPD/DS  - Boost Breeze po TID, each supplement provides 250 kcal and 9 grams of protein  - ProSource Plus 30 ml po TID, each supplement provides 100 kcal and 15 grams of protein  NUTRITION DIAGNOSIS:   Increased nutrient needs related to altered GI function (history of BPD/DS) as evidenced by estimated needs.  GOAL:   Patient will meet greater than or equal to 90% of their needs  MONITOR:   PO intake, Supplement acceptance, Diet advancement, Labs, Weight trends, I & O's  REASON FOR ASSESSMENT:   Malnutrition Screening Tool    ASSESSMENT:   50 year old male who presented to the ED on 9/23 for evaluation of suicidal ideation. 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 Pt found to have low hemoglobin and FOBT positive.  Pt with a history of Biliopancreatic Diversion with Duodenal Switch (BPD/DS). Pt is at extremely high risk for vitamin and mineral deficiencies. Pt with a history of copper deficiency. Discussed recommendation to order vitamin/mineral labs (thiamine, zinc, copper, vitamin A, vitamin D, and vitamin C) with Dr. Hanley Ben. MD would not like to pursue this in an inpatient setting and recommends these be obtained outpatient. Recommendations written above in Intervention portion of note.  Per H&P, pt reporting unintentional weight loss of 50 lbs over the last 4-5 months which is likely significant for timeframe but unsure of weight prior to  any weight loss. Unable to speak with pt via phone call today. Reviewed available weight history in chart. Last available weight PTA is from November 2018 when pt weighed 136.1 kg. Current weight is 122.8 kg. Weight loss is present but unable to define timeframe in which it occurred. Noted pt with deep pitting edema to BLE per nursing assessment which is likely masking true dry weight. Pt is at risk for malnutrition.  Wt Readings from Last 10 Encounters:  09/27/20 122.8 kg  11/11/16 136.1 kg  11/05/16 131.5 kg  08/28/14 (!) 147.9 kg  06/11/14 (!) 154.2 kg  01/01/13 (!) 171.5 kg   GI following. Noted plan for colonoscopy/EGD prior to discharge (possibly 9/26 if pt will drink prep). Pt was on a GI soft diet on 9/24 with 100% meal completions charted for 2 meals. Pt currently on clear liquid diet with 100% meal completion charted for breakfast meal tray. RD will order clear liquid oral nutrition supplements.  Noted copper low on 11/06/16 at 56.  Medications reviewed and include: IV protonix, thiamine  Vitamin/Mineral Profile:  Vitamin B12: 517 (WNL) Folate B9: 8.0 (WNL)  Labs reviewed: BUN 22, hemoglobin 8.0  NUTRITION - FOCUSED PHYSICAL EXAM:  Unable to complete at this time. RD working remotely.  Diet Order:   Diet Order             Diet clear liquid Room service appropriate? Yes; Fluid consistency: Thin  Diet effective 0500 tomorrow                   EDUCATION NEEDS:   Not appropriate for education at this  time  Skin:  Skin Assessment: Reviewed RN Assessment (cuts to BUE)  Last BM:  09/26/20 medium type 6  Height:   Ht Readings from Last 1 Encounters:  09/25/20 5\' 9"  (1.753 m)    Weight:   Wt Readings from Last 1 Encounters:  09/27/20 122.8 kg    BMI:  Body mass index is 39.98 kg/m.  Estimated Nutritional Needs:   Kcal:  2300-2500  Protein:  120-140 grams  Fluid:  >/= 2.0 L    09/29/20, MS, RD, LDN Inpatient Clinical Dietitian Please see  [facility] for contact information.

## 2020-09-27 NOTE — Progress Notes (Signed)
[facility] Gastroenterology Progress Note  Zachary [name] 50 y.o. 1970/04/02   Subjective: Reports 2 nonbloody loose stools this morning. Sitter in room.  Objective: Vital signs: Vitals:   09/27/20 0457 09/27/20 1338  BP: 128/70 137/71  Pulse: 69 62  Resp: 15 14  Temp: 98.6 F (37 C) 98.3 F (36.8 C)  SpO2: 98% 99%    Physical Exam: Gen: lethargic, obese, no acute distress  HEENT: anicteric sclera CV: RRR Chest: CTA B Abd: soft, nontender, nondistended, +BS Ext: no edema  Lab Results: Recent Labs    09/26/20 0506 09/27/20 0444  NA 144 139  K 4.2 4.1  CL 118* 112*  CO2 22 24  GLUCOSE 87 97  BUN 25* 22*  CREATININE 0.98 1.00  CALCIUM 8.0* 7.7*  MG  --  1.8   Recent Labs    09/25/20 1755  AST 16  ALT 19  ALKPHOS 128*  BILITOT 0.5  PROT 5.7*  ALBUMIN 2.6*   Recent Labs    09/25/20 1755 09/26/20 0506 09/26/20 1711 09/27/20 0444  WBC 5.4 4.2  --  4.4  NEUTROABS 3.8  --   --  3.2  HGB 5.3* 6.0* 9.4* 8.0*  HCT 21.6* 22.3* 32.6* 28.8*  MCV 70.4* 72.6*  --  76.2*  PLT 162 125*  --  118*      Assessment/Plan: Acute on chronic anemia in the setting of a gastric bypass (duodenal switch) in need of inpt workup of his anemia with an EGD/colonoscopy. Patient states he is willing to drink the colon prep as directed. He is not happy about not being able to have solid food all day. Clear liquid diet. NPO p MN. EGD/colonoscopy tomorrow at 1230pm by Dr. Dulce Sellar if patient drinks the colon prep.   Zachary [name] 09/27/2020, 2:46 PM  Questions please call [phone] Patient ID: Zachary [name], male   DOB: [birth year], 50 y.o.   MRN: [id number]

## 2020-09-27 NOTE — Progress Notes (Signed)
Pt is making minimal progress with bowel prep intake. Continues to make excuses on why he cant consume and or why he doesn't expect to complete.Not at 1/2 way mark, and GE notified by text page of patient's status.Pt given gentle reminders to continue prep. Will continue to monitor

## 2020-09-28 ENCOUNTER — Encounter (HOSPITAL_COMMUNITY)
Admission: EM | Disposition: A | Discharge status: Other Acute Inpatient Hospital | Payer: Self-pay | Source: Home / Self Care | Attending: Internal Medicine

## 2020-09-28 LAB — BASIC METABOLIC PANEL
Anion gap: 4 — ABNORMAL LOW (ref 5–15)
BUN: 17 mg/dL (ref 6–20)
CO2: 26 mmol/L (ref 22–32)
Calcium: 7.8 mg/dL — ABNORMAL LOW (ref 8.9–10.3)
Chloride: 112 mmol/L — ABNORMAL HIGH (ref 98–111)
Creatinine, Ser: 0.88 mg/dL (ref 0.61–1.24)
GFR, Estimated: >60 mL/min (ref >60)
Glucose, Bld: 84 mg/dL (ref 70–99)
Potassium: 4 mmol/L (ref 3.5–5.1)
Sodium: 142 mmol/L (ref 135–145)

## 2020-09-28 LAB — TYPE AND SCREEN
ABO/RH(D): B POS
Antibody Screen: NEGATIVE
Unit division: 0
Unit division: 0
Unit division: 0
Unit division: 0

## 2020-09-28 LAB — CBC WITH DIFFERENTIAL/PLATELET
Abs Immature Granulocytes: 0.01 10*3/uL (ref 0.00–0.07)
Basophils Absolute: 0 10*3/uL (ref 0.0–0.1)
Basophils Relative: 1 %
Eosinophils Absolute: 0.1 10*3/uL (ref 0.0–0.5)
Eosinophils Relative: 1 %
HCT: 27.1 % — ABNORMAL LOW (ref 39.0–52.0)
Hemoglobin: 7.7 g/dL — ABNORMAL LOW (ref 13.0–17.0)
Immature Granulocytes: 0 %
Lymphocytes Relative: 35 %
Lymphs Abs: 1.4 10*3/uL (ref 0.7–4.0)
MCH: 21.6 pg — ABNORMAL LOW (ref 26.0–34.0)
MCHC: 28.4 g/dL — ABNORMAL LOW (ref 30.0–36.0)
MCV: 76.1 fL — ABNORMAL LOW (ref 80.0–100.0)
Monocytes Absolute: 0.3 10*3/uL (ref 0.1–1.0)
Monocytes Relative: 8 %
Neutro Abs: 2.2 10*3/uL (ref 1.7–7.7)
Neutrophils Relative %: 55 %
Platelets: 112 10*3/uL — ABNORMAL LOW (ref 150–400)
RBC: 3.56 MIL/uL — ABNORMAL LOW (ref 4.22–5.81)
RDW: 23 % — ABNORMAL HIGH (ref 11.5–15.5)
WBC: 4 10*3/uL (ref 4.0–10.5)
nRBC: 0 % (ref 0.0–0.2)

## 2020-09-28 LAB — BPAM RBC
Blood Product Expiration Date: 202210012359
Blood Product Expiration Date: 202210172359
Blood Product Expiration Date: 202210172359
Blood Product Expiration Date: 202210172359
ISSUE DATE / TIME: 202209232008
ISSUE DATE / TIME: 202209240013
ISSUE DATE / TIME: 202209240637
ISSUE DATE / TIME: 202209241230
Unit Type and Rh: 7300
Unit Type and Rh: 7300
Unit Type and Rh: 7300
Unit Type and Rh: 7300

## 2020-09-28 LAB — MAGNESIUM: Magnesium: 1.9 mg/dL (ref 1.7–2.4)

## 2020-09-28 SURGERY — COLONOSCOPY WITH PROPOFOL
Anesthesia: Monitor Anesthesia Care

## 2020-09-28 MED ORDER — PEG 3350-KCL-NA BICARB-NACL 420 G PO SOLR: 4000.0000 mL | Freq: Once | ORAL | Status: DC

## 2020-09-28 MED ORDER — PANTOPRAZOLE SODIUM 40 MG PO TBEC: 40.0000 mg | DELAYED_RELEASE_TABLET | Freq: Two times a day (BID) | ORAL | 0 refills | Status: DC

## 2020-09-28 MED ORDER — OLANZAPINE 5 MG PO TBDP: 5.0000 mg | ORAL_TABLET | Freq: Every day | ORAL | Status: DC

## 2020-09-28 MED ORDER — THIAMINE HCL 100 MG PO TABS: 100.0000 mg | ORAL_TABLET | Freq: Every day | ORAL | Status: DC

## 2020-09-28 MED ORDER — CLONAZEPAM 1 MG PO TBDP: 1.0000 mg | ORAL_TABLET | Freq: Every day | ORAL | Status: DC

## 2020-09-28 MED ORDER — OLANZAPINE 5 MG PO TBDP: 2.5000 mg | ORAL_TABLET | Freq: Three times a day (TID) | ORAL | Status: DC | PRN

## 2020-09-28 NOTE — Progress Notes (Addendum)
[facility] Gastroenterology Progress Note  Zachary [name] 50 y.o. January 02, 1971  CC:  Anemia   Subjective: Found to have HB of 5.3 with FOBT positive. Has a EGD/Colonoscopy scheduled today. Patient has struggled to get through prep. Nurse states he just finished the gallon this morning. Reports last BM was around 5am and was "muddy" and brown. No clear or watery stools yet. Denies abdominal pain, nausea, vomiting. Denies melena/hematochezia  ROS : Review of Systems  Cardiovascular:  Negative for chest pain and palpitations.  Gastrointestinal:  Negative for abdominal pain, blood in stool, constipation, diarrhea, heartburn, melena, nausea and vomiting.     Objective: Vital signs in last 24 hours: Vitals:   09/27/20 2343 09/28/20 0736  BP: 132/75 137/67  Pulse: (!) 58 (!) 54  Resp: 16 20  Temp: 98.3 F (36.8 C) 97.8 F (36.6 C)  SpO2: 100% 100%    Physical Exam:  General:  Alert, cooperative, no distress, appears stated age. Sitter in the room  Head:  Normocephalic, without obvious abnormality, atraumatic  Eyes:  Anicteric sclera, EOM's intact, conjunctival pallor  Lungs:   Clear to auscultation bilaterally, respirations unlabored  Heart:  Regular rate and rhythm, S1, S2 normal  Abdomen:   Soft, non-tender, bowel sounds active all four quadrants,  no masses,     Lab Results: Recent Labs    09/27/20 0444 09/28/20 0440  NA 139 142  K 4.1 4.0  CL 112* 112*  CO2 24 26  GLUCOSE 97 84  BUN 22* 17  CREATININE 1.00 0.88  CALCIUM 7.7* 7.8*  MG 1.8 1.9   Recent Labs    09/25/20 1755  AST 16  ALT 19  ALKPHOS 128*  BILITOT 0.5  PROT 5.7*  ALBUMIN 2.6*   Recent Labs    09/27/20 0444 09/28/20 0440  WBC 4.4 4.0  NEUTROABS 3.2 2.2  HGB 8.0* 7.7*  HCT 28.8* 27.1*  MCV 76.2* 76.1*  PLT 118* 112*   Recent Labs    09/25/20 1755  LABPROT 15.7*  INR 1.3*      Assessment Anemia - HGB 7.7 (decreased from 8.0 yesterday) - Normal renal function BUN 17 (decreased  from 22 yesterday) and Cr 0.88  S/p biliopancreatic diversion with duodenal switch  Suicidal ideation  Plan:  Due to unsuccessful colon prep so far, procedure cancelled today and reschedule for 9/28 at 12:30pm.  Recommend additional prep of NuLYTELY. Can continue clear liquids.  Continue IV Protonix BID  Plan for EGD/Colonoscopy Wednesday. I thoroughly discussed the procedures to include nature, alternatives, benefits, and risks including but not limited to bleeding, perforation, infection, anesthesia/cardiac and pulmonary complications. Patient provides understanding and gave verbal consent to proceed.   Continue daily CBC with transfusion as needed to maintain Hgb >7.    [facility] will follow.     Legrand Como PA-C 09/28/2020, 10:37 AM  Contact #  [phone]

## 2020-09-28 NOTE — Evaluation (Signed)
Physical Therapy Evaluation Patient Details Name: Zachary [name] MRN: [id number] DOB: 1970-08-04 Today's Date: 09/28/2020  History of Present Illness  50 y.o. male with medical history significant for biliopancreatic diversion with duodenal switch, anemia requiring blood transfusion, chronic back and lower extremity pain, PTSD, bipolar, depression who presented for evaluation of suicidal ideation.  He reports that he has been having increased depression and anxiety with building thoughts of suicide over the last few weeks. Pt found to have Hgb of 5.3. GI workup pending.  Clinical Impression  Pt admitted with above diagnosis. Pt reports he's been ambulating in the room to go to the bathroom. At baseline he ambulates ~25' with a rollator or cane, and uses a WC for distances longer than 25'. Pt appears to be near baseline with mobility.  Pt currently with functional limitations due to the deficits listed below (see PT Problem List). Pt will benefit from skilled PT to increase their independence and safety with mobility to allow discharge to the venue listed below.          Recommendations for follow up therapy are one component of a multi-disciplinary discharge planning process, led by the attending physician.  Recommendations may be updated based on patient status, additional functional criteria and insurance authorization.  Follow Up Recommendations No PT follow up    Equipment Recommendations  None recommended by PT    Recommendations for Other Services       Precautions / Restrictions Precautions Precautions: Fall Precaution Comments: fall (pt reports 6 falls in past year, most recently ~10 days ago, tends to happen when he over exerts himself) Restrictions Weight Bearing Restrictions: No      Mobility  Bed Mobility Overal bed mobility: Modified Independent             General bed mobility comments: used rail    Transfers Overall transfer level: Needs  assistance Equipment used: None Transfers: Sit to/from [employer] Sit to Stand: Supervision Stand pivot transfers: Supervision       General transfer comment: supervision for safety 2* h/o falls, no loss of balance  Ambulation/Gait             General Gait Details: deferred (pt fatigued from frequent walks to the bathroom 2* colonoscopy prep)  Stairs            Wheelchair Mobility    Modified Rankin (Stroke Patients Only)       Balance Overall balance assessment: Needs assistance;History of Falls Sitting-balance support: Feet supported;No upper extremity supported Sitting balance-Leahy Scale: Good       Standing balance-Leahy Scale: Fair Standing balance comment: fair static, relies on at least single UE support for dynamic                             Pertinent Vitals/Pain Pain Assessment: 0-10 Pain Score: 7  Pain Location: back Pain Descriptors / Indicators: Aching Pain Intervention(s): Limited activity within patient's tolerance;Monitored during session    Home Living Family/patient expects to be discharged to:: Private residence Living Arrangements: Other relatives;Spouse/significant other Available Help at Discharge: Family;Available 24 hours/day Type of Home: House Home Access: Stairs to enter   [employer] of Steps: 2 Home Layout: One level Home Equipment: Walker - 4 wheels;Wheelchair - [employer] - quad Additional Comments: lives with girlfriend and sister    Prior Function Level of Independence: Independent with assistive device(s)         Comments: uses Rollator  or cane in home up to about 25', uses WC for longer distances; ~6 falls in past year     Hand Dominance        Extremity/Trunk Assessment   Upper Extremity Assessment Upper Extremity Assessment: Overall WFL for tasks assessed    Lower Extremity Assessment Lower Extremity Assessment: Generalized weakness;RLE deficits/detail;LLE  deficits/detail (~4/5 B knee extension) RLE Deficits / Details: 4/5 knee ext RLE Sensation: history of peripheral neuropathy LLE Deficits / Details: 4/5 knee ext LLE Sensation: history of peripheral neuropathy    Cervical / Trunk Assessment Cervical / Trunk Assessment: Kyphotic  Communication   Communication: No difficulties  Cognition Arousal/Alertness: Awake/alert Behavior During Therapy: WFL for tasks assessed/performed Overall Cognitive Status: Within Functional Limits for tasks assessed                                        General Comments      Exercises     Assessment/Plan    PT Assessment Patient needs continued PT services  PT Problem List Decreased activity tolerance;Pain       PT Treatment Interventions Gait training;Therapeutic activities;Therapeutic exercise;Patient/family education;Balance training;Functional mobility training    PT Goals (Current goals can be found in the Care Plan section)  Acute Rehab PT Goals Patient Stated Goal: return home to prior level of function PT Goal Formulation: With patient Time For Goal Achievement: 10/12/20 Potential to Achieve Goals: Good    Frequency Min 3X/week   Barriers to discharge        Co-evaluation               AM-PAC PT "6 Clicks" Mobility  Outcome Measure Help needed turning from your back to your side while in a flat bed without using bedrails?: None Help needed moving from lying on your back to sitting on the side of a flat bed without using bedrails?: None Help needed moving to and from a bed to a chair (including a wheelchair)?: None Help needed standing up from a chair using your arms (e.g., wheelchair or bedside chair)?: None Help needed to walk in hospital room?: None Help needed climbing 3-5 steps with a railing? : A Little 6 Click Score: 23    End of Session   Activity Tolerance: Patient tolerated treatment well;No increased pain Patient left: in chair;with call  bell/phone within reach;with nursing/sitter in room Nurse Communication: Mobility status PT Visit Diagnosis: Difficulty in walking, not elsewhere classified (R26.2);Muscle weakness (generalized) (M62.81);History of falling (Z91.81)    Time: 8182-9937 PT Time Calculation (min) (ACUTE ONLY): 13 min   Charges:   PT Evaluation $PT Eval Moderate Complexity: 1 Mod        Tamala Ser PT 09/28/2020  Acute Rehabilitation Services Pager [phone] Office [phone]

## 2020-09-28 NOTE — Consult Note (Signed)
[name] Psychiatry Followup Psychiatric Evaluation   Service Date: September 28, 2020 LOS:  LOS: 3 days      Assessment  MONTEL VANDERHOOF is a 50 y.o. male admitted medically for 09/25/2020  5:18 PM for a suicide attempt; was admitted for severe anemia. He carries the psychiatric diagnoses of major depressive disorder and has an extensive past medical history of  degenerative disk disease and gastric bypass.Psychiatry was consulted for his suicide attempt.     His current presentation of chronic nonreactive low mood, poor sleep, poor appetite, anhedonia, and suicidality is most consistent with depression. He describes episodes consistent with hypomania; unclear if he meets full criteria for bipolar I disorder. He has many personality traits seen in patients with borderline personality disorder (all or nothing thinking, splitting staff) however these traits are often worsened in the hospital setting and I am not making a formal diagnosis at this time.  Current outpatient psychotropic medications include nothing and historically he has had a poor response to medications. He was difficult to engage in discussion about starting a medication, offering rebuttals to several medications suggested. On initial examination, patient endorses ongoing suicidality and a desire for inpatient hospitalizaiton. Later he complained of not receiving home psychotropic medications (although had endorsed none the day prior); klonopin restarted to prevent withdrawal and olanzapine started for bipolar depression; would consider addition of fluoxetine in the future.    9/26: Saw in AM; pt was awaiting colonoscopy and looking forward to medical clearance and transfer to behavioral health hospital. Later spent significant time coordinating care after pt was found to have inadequate colonoscopy prep and refused another round of colonoscopy prep.   Diagnoses:  Active Hospital problems: Principal Problem:   Iron  deficiency anemia Active Problems:   Bipolar II disorder (HCC)   Depression due to physical illness   Morbid obesity (HCC)   Suicidal ideation   Unintentional weight loss    Problems edited/added by me: No problems updated.  Plan  ## Safety and Observation Level:  - Based on my clinical evaluation, I estimate the patient to be at high risk in the current setting - At this time, we recommend sitter and 1:1. We also recommend IVC given pt's suicidality and persistent threats to leave AMA. Would consider converting to voluntary status after inpt hospitalization.   This decision is based on my review of the chart including patient's history and current presentation, interview of the patient, mental status examination, and consideration of suicide risk including evaluating suicidal ideation, plan, intent, suicidal or self-harm behaviors, risk factors, and protective factors. This judgment is based on our ability to directly address suicide risk, implement suicide prevention strategies and develop a safety plan while the patient is in the clinical setting. Please contact our team if there is a concern that risk level has changed. - please IVC pt if he attempts to leave  ## Medications:  STANDING -- klonopin 1 mg PO QHS (home med, anxiety, insomnia)  -- olanzapine 5 mg  QHS (bipolar, insomnia, agitation)- zydis given h/o gastric bypass   PRN - olanzapine 2.5 mg q8 PO PRN agitation - zydis as above.  ## Medical Decision Making Capacity:  Not formally assessed   ## Further Work-up: - add lipid profile/a1c on to labs  - please get vitamin D, consider copper level (hx gastric bypass, neuropsych sx)  -- TSH wnl -- B12 wnl, previously incorrectly noted as low -- iron stores low Folate wnl   -- HIV (-),   ##  Disposition:  To inpt psych when medically clear  ## Behavioral / Environmental:  -- Utilize compassion and acknowledge the patient's experiences while setting clear and  realistic expectations for care. -- To minimize splitting of staff, assign one staff person to communicate all information from the team when feasible.   Thank you for this consult request. Recommendations have been communicated to the primary team.  We will continue to  at this time.   Christionna Poland A Sohail Capraro    followup history  Relevant Aspects of Hospital Course:  Admitted on 09/25/2020 for iron deficiency anemia in the setting of suicide attempt. Has been generally engaging in help-rejecting complaining behavior (came to hospital for SI, threatening to leave AMA, refused all psychotropic meds offered, complaining of not getting home meds, stating that he didn't care about home meds this AM intermittently refusing EGD) and splitting of staff since admission.   Patient Report:  Patient seen in AM. He has good rapport with current sitter - joking about colonoscopy prep. He is looking forward to getting his colonoscopy. He denies current SI, HI, AH/VH. No side effects noted from medications last night although had a poor night of sleep secondary to neck pain. We discussed IVC process and processed difficult conversation from yesterday.    Collateral 9/25: Called girlfriend to discuss care thus far with patient's consent. Before he came to the hospital he has been really getting worn out easily, tired, lethargic, in a lot of pain, a week ago he took a really nasty fall and his pain has been worse since. He is scheduled for a disability hearing in November. She confirms 30-40lb weight loss in the last couple of months. She is worried about him both medically and psychiatrically. As he doesn't have insurance or an income he feels very trapped. He does sometimes cut but his recent cutting was much more extensive than usual. He has been making parasuicidal statements to his girlfriend. She is unsure if recent cutting was a suicide attempt due to the angle of the cuts but doesn't think it was very far off  from a suicide attempt. Discussed IVC with family.    ROS:  Largely significant for fatigue, diarrhea 2/2 bowel prep   Psychiatric History:  See initial consult note.   Medical History: Past Medical History:  Diagnosis Date  . Anxiety   . Arthritis   . Bipolar 1 disorder, depressed (HCC)   . Fibromyalgia   . Hx of degenerative disc disease   . Obesity   . PTSD (post-traumatic stress disorder)     Surgical History: Past Surgical History:  Procedure Laterality Date  . ADENOIDECTOMY    . APPENDECTOMY    . CHOLECYSTECTOMY    . GASTRIC BYPASS    . TYMPANOSTOMY TUBE PLACEMENT      Medications:   Current Facility-Administered Medications:  .  (feeding supplement) PROSource Plus liquid 30 mL, 30 mL, Oral, TID BM, Alekh, Kshitiz, MD, 30 mL at 09/27/20 1657 .  0.9 %  sodium chloride infusion (Manually program via Guardrails IV Fluids), , Intravenous, Once, Blount, Xenia T, NP .  0.9 %  sodium chloride infusion, 250 mL, Intravenous, PRN, Chotiner, Claudean Severance, MD .  0.9 %  sodium chloride infusion, , Intravenous, Continuous, Charlott Rakes, MD .  acetaminophen (TYLENOL) tablet 650 mg, 650 mg, Oral, Q6H PRN, 650 mg at 09/26/20 2200 **OR** acetaminophen (TYLENOL) suppository 650 mg, 650 mg, Rectal, Q6H PRN, Chotiner, Claudean Severance, MD .  calcium carbonate (TUMS - dosed in  mg elemental calcium) chewable tablet 200 mg of elemental calcium, 1 tablet, Oral, QID, Alekh, Kshitiz, MD, 200 mg of elemental calcium at 09/28/20 1221 .  clonazePAM (KLONOPIN) disintegrating tablet 1 mg, 1 mg, Oral, QHS, Blakely Maranan A .  diphenhydrAMINE (BENADRYL) capsule 25 mg, 25 mg, Oral, QHS PRN, Blount, Xenia T, NP .  feeding supplement (BOOST / RESOURCE BREEZE) liquid 1 Container, 1 Container, Oral, TID BM, Alekh, Kshitiz, MD .  HYDROmorphone (DILAUDID) injection 1 mg, 1 mg, Intravenous, Q3H PRN, Hanley Ben, Kshitiz, MD, 1 mg at 09/27/20 1656 .  multivitamin with minerals tablet 1 tablet, 1 tablet, Oral,  BID, Hanley Ben, Kshitiz, MD, 1 tablet at 09/28/20 1221 .  OLANZapine zydis (ZYPREXA) disintegrating tablet 2.5 mg, 2.5 mg, Oral, TID PRN, Jyl Chico A .  OLANZapine zydis (ZYPREXA) disintegrating tablet 5 mg, 5 mg, Oral, QHS, Sydni Elizarraraz A, 5 mg at 09/27/20 2225 .  ondansetron (ZOFRAN) tablet 4 mg, 4 mg, Oral, Q6H PRN **OR** ondansetron (ZOFRAN) injection 4 mg, 4 mg, Intravenous, Q6H PRN, Chotiner, Claudean Severance, MD .  pantoprazole (PROTONIX) injection 40 mg, 40 mg, Intravenous, Q12H, Alekh, Kshitiz, MD, 40 mg at 09/28/20 1221 .  polyethylene glycol-electrolytes (NuLYTELY) solution 4,000 mL, 4,000 mL, Oral, Once, McMichael, Bayley M, PA-C .  sodium chloride flush (NS) 0.9 % injection 3 mL, 3 mL, Intravenous, Q12H, Chotiner, Claudean Severance, MD, 3 mL at 09/28/20 1225 .  sodium chloride flush (NS) 0.9 % injection 3 mL, 3 mL, Intravenous, PRN, Chotiner, Claudean Severance, MD .  thiamine tablet 100 mg, 100 mg, Oral, Daily, Trevon Strothers A, 100 mg at 09/28/20 1221 .  traMADol (ULTRAM) tablet 50 mg, 50 mg, Oral, Q6H PRN, Chotiner, Claudean Severance, MD  Allergies: Allergies  Allergen Reactions  . Capsaicin Other (See Comments)    Burning on skin - causes blisters immediately   . Codeine Itching  . Latex Itching and Rash    Social History: Lives with girlfriend  Family History:  The patient's family history includes Cancer in his father; Diabetes in his father.    Objective  Vital signs:  Temp:  [97.8 F (36.6 C)-98.3 F (36.8 C)] 97.8 F (36.6 C) (09/26 0736) Pulse Rate:  [54-62] 54 (09/26 0736) Resp:  [14-20] 20 (09/26 0736) BP: (132-137)/(67-75) 137/67 (09/26 0736) SpO2:  [99 %-100 %] 100 % (09/26 0736)  Physical Exam: General: Obese, dejected appearing, no episodes of writhing in pain appreciated. Pulm: no increased work of breathing Eyes: Sclera anicteric Neuro: CNII-XII grossly intact  Mental status exam On my mental status examination presently, the patient is alert,  attentive, and grossly oriented to person, place, and time.  Appearance: tired, resigned,.  Speech spontaneous and normal in volume, rate, and has similar prosody compared to yesterday (reduced from intake). He did not make provocative comments today.  Mood:  tired.  Affect:  dejected.  Thought process:  Linear associations, normal rate, logical.  Thought content is significant for suicidal content, devoid of homicidal or delusional content. Some mild paranoia to medical team specifically primary doctor. There are no evident or endorsed hallucinations.  Judgment is poor.  Insight is poor.  Recent and remote memory:  Grossly intact.  Language is intact.  Fund of knowledge:  Grossly intact.

## 2020-09-28 NOTE — Discharge Summary (Addendum)
Physician Discharge Summary  MECCA BARGA ZOX:[id number] DOB: Jan 27, 1970 DOA: 09/25/2020  PCP: Pcp, No  Admit date: 09/25/2020 Discharge date: 09/29/2020  Admitted From: Home Disposition: [facility]  Recommendations for Outpatient Follow-up:  Follow up with [facility] provider at earliest convenience outpatient follow-up with GI  follow up in ED if symptoms worsen or new appear   Home Health: No Equipment/Devices: None  Discharge Condition: Stable CODE STATUS: Full Diet recommendation: Regular  Brief/Interim Summary: 50 y.o. male with medical history significant for biliopancreatic diversion with duodenal switch, anemia requiring blood transfusion, chronic back and lower extremity pain, PTSD, bipolar, depression who presented for evaluation of suicidal ideation.  He was found to have hemoglobin of 5.3 with FOBT positive.  Psychiatry and GI were consulted.  He was transfused 2 units packed red cells during this hospitalization.  Psychiatry recommended inpatient psychiatric hospitalization once medically stable.  GI had planned for EGD/colonoscopy for today but colonoscopy prep was inadequate and GI wanted to reschedule the colonoscopy but patient refused to do more prep for colonoscopy.  GI has cleared the patient for discharge to behavioral health Hospital.  He will be discharged to inpatient psychiatric facility once bed is available.  Addendum on 09/29/2020: Patient is waiting to be discharged to [facility].  He is currently medically stable for discharge.  Discharge Diagnoses:   Acute on chronic symptomatic anemia Iron deficiency anemia -Presented with hemoglobin of 5.3.  Prior hemoglobin in our system was in the 8's in 2018. -Status post 4 units packed red cell transfusion.  Hemoglobin 7.7 today.  -Currently on IV Protonix. -GI had planned for EGD/colonoscopy for today but colonoscopy prep was inadequate and GI wanted to reschedule the colonoscopy but patient refused to do more prep for  colonoscopy.  GI has cleared the patient for discharge to behavioral health Hospital.  Outpatient follow-up with GI.  We will switch IV Protonix to Protonix oral twice a day till outpatient GI follow-up.   Suicidal ideation -Continue Software engineer.   - Psychiatry following: Will need inpatient psychiatric hospitalization once medically stable. -He is currently IVC'd -Continue Klonopin and olanzapine as per psychiatry. -Currently stable for discharge to inpatient psychiatric facility.  Discharge once bed is available.   Morbid obesity status post gastric bypass in the past  -outpatient follow-up   Unintentional weight loss -CT of the abdomen and pelvis showed no acute intra-abdominal pathology   Cholelithiasis -Might need outpatient general surgery evaluation   Chronic back pain -Apparently has chronic back pain.  Outpatient follow-up with PCP/pain management  Discharge Instructions  Discharge Instructions     Diet general   Complete by: As directed    Increase activity slowly   Complete by: As directed       Allergies as of 09/28/2020       Reactions   Capsaicin Other (See Comments)   Burning on skin - causes blisters immediately    Codeine Itching   Latex Itching, Rash        Medication List     STOP taking these medications    clonazePAM 1 MG tablet Commonly known as: KLONOPIN Replaced by: clonazePAM 1 MG disintegrating tablet   mirtazapine 15 MG disintegrating tablet Commonly known as: REMERON SOL-TAB   Potassium 99 MG Tabs   risperiDONE 1 MG disintegrating tablet Commonly known as: RISPERDAL M-TABS       TAKE these medications    acetaminophen 500 MG tablet Commonly known as: TYLENOL Take 1,000 mg by mouth every 6 (six) hours as  needed for moderate pain.   calcium-vitamin D 500-200 MG-UNIT tablet Commonly known as: Oscal 500/200 D-3 Take 2 tablets 2 (two) times daily by mouth.   clonazePAM 1 MG disintegrating tablet Commonly known as:  KLONOPIN Take 1 tablet (1 mg total) by mouth at bedtime. Replaces: clonazePAM 1 MG tablet   ferrous sulfate 325 (65 FE) MG tablet Take 1 tablet (325 mg total) by mouth daily with breakfast.   gabapentin 300 MG capsule Commonly known as: NEURONTIN Take 300 mg by mouth 3 (three) times daily.   methocarbamol 500 MG tablet Commonly known as: ROBAXIN Take 1,000 mg by mouth at bedtime.   OLANZapine zydis 5 MG disintegrating tablet Commonly known as: ZYPREXA Take 1 tablet (5 mg total) by mouth at bedtime.   OLANZapine zydis 5 MG disintegrating tablet Commonly known as: ZYPREXA Take 0.5 tablets (2.5 mg total) by mouth 3 (three) times daily as needed (agitation).   pantoprazole 40 MG tablet Commonly known as: Protonix Take 1 tablet (40 mg total) by mouth 2 (two) times daily before a meal.   thiamine 100 MG tablet Take 1 tablet (100 mg total) by mouth daily. Start taking on: September 29, 2020        Follow-up Information     Willis Modena, MD Follow up in 2 week(s).   Specialty: Gastroenterology Contact information: [address] [place] [address] [phone]                Allergies  Allergen Reactions   Capsaicin Other (See Comments)    Burning on skin - causes blisters immediately    Codeine Itching   Latex Itching and Rash    Consultations: GI/psychiatry   Procedures/Studies: CT ABDOMEN PELVIS W CONTRAST  Result Date: 09/25/2020 CLINICAL DATA:  Abdominal pain, acute, nonlocalized unintended weight loss. Unintentional weight loss. Left inguinal mass EXAM: CT ABDOMEN AND PELVIS WITH CONTRAST TECHNIQUE: Multidetector CT imaging of the abdomen and pelvis was performed using the standard protocol following bolus administration of intravenous contrast. CONTRAST:  68mL OMNIPAQUE IOHEXOL 350 MG/ML SOLN COMPARISON:  None. FINDINGS: Lower chest: No acute abnormality. Hepatobiliary: Cholelithiasis noted without pericholecystic inflammatory  change. No intra or extrahepatic biliary ductal dilation. Liver unremarkable. Pancreas: Unremarkable Spleen: The spleen is moderately enlarged measuring 16.8 cm in greatest dimension. No intrasplenic lesions are seen. Adrenals/Urinary Tract: The adrenal glands are unremarkable. The kidneys are normal in size and position. Bilateral parapelvic renal cysts are noted. There is mild asymmetric right renal cortical atrophy. No hydronephrosis. No intrarenal or ureteral calculi. No enhancing intra cortical masses. The bladder is unremarkable. Stomach/Bowel: Surgical changes of gastric sleeve resection, appendectomy, and partial small-bowel resection are identified. No evidence of obstruction or focal inflammation. No free intraperitoneal gas or fluid. Vascular/Lymphatic: No pathologic adenopathy within the abdomen and pelvis. The abdominal vasculature is unremarkable. Reproductive: Prostate is unremarkable. Other: There is diffuse body wall subcutaneous edema. Tiny broad-based fat containing umbilical hernia. The rectum is unremarkable. Musculoskeletal: No acute bone abnormality. No lytic or blastic bone lesion. Degenerative changes seen within the lumbar spine. IMPRESSION: No acute intra-abdominal pathology identified. Cholelithiasis. Diffuse body wall subcutaneous edema. No abnormal subcutaneous soft tissue mass, lymphadenopathy, or fluid collection identified to account for the patient's reported left inguinal palpable abnormality. Moderate splenomegaly Electronically Signed   By: Helyn Numbers M.D.   On: 09/25/2020 23:59      Subjective: Patient refused to talk to me or see me on 09/27/2020.  Hence I did not evaluate him  today.  I have reviewed his chart electronically and discussed with nursing staff.   Discharge Exam: Vitals:   09/27/20 2343 09/28/20 0736  BP: 132/75 137/67  Pulse: (!) 58 (!) 54  Resp: 16 20  Temp: 98.3 F (36.8 C) 97.8 F (36.6 C)  SpO2: 100% 100%    have reviewed his vitals myself.   Patient was not examined physically     The results of significant diagnostics from this hospitalization (including imaging, microbiology, ancillary and laboratory) are listed below for reference.     Microbiology: Recent Results (from the past 240 hour(s))  Resp Panel by RT-PCR (Flu A&B, Covid) Nasopharyngeal Swab     Status: None   Collection Time: 09/25/20  5:55 PM   Specimen: Nasopharyngeal Swab; Nasopharyngeal(NP) swabs in vial transport medium  Result Value Ref Range Status   SARS Coronavirus 2 by RT PCR NEGATIVE NEGATIVE Final    Comment: (NOTE) SARS-CoV-2 target nucleic acids are NOT DETECTED.  The SARS-CoV-2 RNA is generally detectable in upper respiratory specimens during the acute phase of infection. The lowest concentration of SARS-CoV-2 viral copies this assay can detect is 138 copies/mL. A negative result does not preclude SARS-Cov-2 infection and should not be used as the sole basis for treatment or other patient management decisions. A negative result may occur with  improper specimen collection/handling, submission of specimen other than nasopharyngeal swab, presence of viral mutation(s) within the areas targeted by this assay, and inadequate number of viral copies(<138 copies/mL). A negative result must be combined with clinical observations, patient history, and epidemiological information. The expected result is Negative.  Fact Sheet for Patients:  BloggerCourse.com  Fact Sheet for Healthcare Providers:  SeriousBroker.it  This test is no t yet approved or cleared by the [place] FDA and  has been authorized for detection and/or diagnosis of SARS-CoV-2 by FDA under an Emergency Use Authorization (EUA). This EUA will remain  in effect (meaning this test can be used) for the duration of the COVID-19 declaration under Section 564(b)(1) of the Act, 21 U.S.C.section 360bbb-3(b)(1), unless the authorization  is terminated  or revoked sooner.       Influenza A by PCR NEGATIVE NEGATIVE Final   Influenza B by PCR NEGATIVE NEGATIVE Final    Comment: (NOTE) The Xpert Xpress SARS-CoV-2/FLU/RSV plus assay is intended as an aid in the diagnosis of influenza from Nasopharyngeal swab specimens and should not be used as a sole basis for treatment. Nasal washings and aspirates are unacceptable for Xpert Xpress SARS-CoV-2/FLU/RSV testing.  Fact Sheet for Patients: BloggerCourse.com  Fact Sheet for Healthcare Providers: SeriousBroker.it  This test is not yet approved or cleared by the [place] FDA and has been authorized for detection and/or diagnosis of SARS-CoV-2 by FDA under an Emergency Use Authorization (EUA). This EUA will remain in effect (meaning this test can be used) for the duration of the COVID-19 declaration under Section 564(b)(1) of the Act, 21 U.S.C. section 360bbb-3(b)(1), unless the authorization is terminated or revoked.  Performed at [facility], [address] [place], [place] [address]      Labs: BNP (last 3 results) No results for input(s): BNP in the last 8760 hours. Basic Metabolic Panel: Recent Labs  Lab 09/25/20 1755 09/26/20 0506 09/27/20 0444 09/28/20 0440  NA 142 144 139 142  K 4.4 4.2 4.1 4.0  CL 118* 118* 112* 112*  CO2 21* 22 24 26   GLUCOSE 87 87 97 84  BUN 24* 25* 22* 17  CREATININE  1.07 0.98 1.00 0.88  CALCIUM 8.0* 8.0* 7.7* 7.8*  MG  --   --  1.8 1.9   Liver Function Tests: Recent Labs  Lab 09/25/20 1755  AST 16  ALT 19  ALKPHOS 128*  BILITOT 0.5  PROT 5.7*  ALBUMIN 2.6*   No results for input(s): LIPASE, AMYLASE in the last 168 hours. No results for input(s): AMMONIA in the last 168 hours. CBC: Recent Labs  Lab 09/25/20 1755 09/26/20 0506 09/26/20 1711 09/27/20 0444 09/28/20 0440  WBC 5.4 4.2  --  4.4 4.0  NEUTROABS 3.8  --   --  3.2 2.2  HGB 5.3*  6.0* 9.4* 8.0* 7.7*  HCT 21.6* 22.3* 32.6* 28.8* 27.1*  MCV 70.4* 72.6*  --  76.2* 76.1*  PLT 162 125*  --  118* 112*   Cardiac Enzymes: No results for input(s): CKTOTAL, CKMB, CKMBINDEX, TROPONINI in the last 168 hours. BNP: Invalid input(s): POCBNP CBG: No results for input(s): GLUCAP in the last 168 hours. D-Dimer No results for input(s): DDIMER in the last 72 hours. Hgb A1c No results for input(s): HGBA1C in the last 72 hours. Lipid Profile No results for input(s): CHOL, HDL, LDLCALC, TRIG, CHOLHDL, LDLDIRECT in the last 72 hours. Thyroid function studies Recent Labs    09/27/20 0444  TSH 4.559*   Anemia work up Recent Labs    09/25/20 1858  VITAMINB12 517  FOLATE 8.0  FERRITIN 2*  TIBC 312  IRON 16*  RETICCTPCT 1.5   Urinalysis No results found for: COLORURINE, APPEARANCEUR, LABSPEC, PHURINE, GLUCOSEU, HGBUR, BILIRUBINUR, KETONESUR, PROTEINUR, UROBILINOGEN, NITRITE, LEUKOCYTESUR Sepsis Labs Invalid input(s): PROCALCITONIN,  WBC,  LACTICIDVEN Microbiology Recent Results (from the past 240 hour(s))  Resp Panel by RT-PCR (Flu A&B, Covid) Nasopharyngeal Swab     Status: None   Collection Time: 09/25/20  5:55 PM   Specimen: Nasopharyngeal Swab; Nasopharyngeal(NP) swabs in vial transport medium  Result Value Ref Range Status   SARS Coronavirus 2 by RT PCR NEGATIVE NEGATIVE Final    Comment: (NOTE) SARS-CoV-2 target nucleic acids are NOT DETECTED.  The SARS-CoV-2 RNA is generally detectable in upper respiratory specimens during the acute phase of infection. The lowest concentration of SARS-CoV-2 viral copies this assay can detect is 138 copies/mL. A negative result does not preclude SARS-Cov-2 infection and should not be used as the sole basis for treatment or other patient management decisions. A negative result may occur with  improper specimen collection/handling, submission of specimen other than nasopharyngeal swab, presence of viral mutation(s) within  the areas targeted by this assay, and inadequate number of viral copies(<138 copies/mL). A negative result must be combined with clinical observations, patient history, and epidemiological information. The expected result is Negative.  Fact Sheet for Patients:  BloggerCourse.com  Fact Sheet for Healthcare Providers:  SeriousBroker.it  This test is no t yet approved or cleared by the [place] FDA and  has been authorized for detection and/or diagnosis of SARS-CoV-2 by FDA under an Emergency Use Authorization (EUA). This EUA will remain  in effect (meaning this test can be used) for the duration of the COVID-19 declaration under Section 564(b)(1) of the Act, 21 U.S.C.section 360bbb-3(b)(1), unless the authorization is terminated  or revoked sooner.       Influenza A by PCR NEGATIVE NEGATIVE Final   Influenza B by PCR NEGATIVE NEGATIVE Final    Comment: (NOTE) The Xpert Xpress SARS-CoV-2/FLU/RSV plus assay is intended as an aid in the diagnosis of influenza from Nasopharyngeal swab specimens and  should not be used as a sole basis for treatment. Nasal washings and aspirates are unacceptable for Xpert Xpress SARS-CoV-2/FLU/RSV testing.  Fact Sheet for Patients: BloggerCourse.com  Fact Sheet for Healthcare Providers: SeriousBroker.it  This test is not yet approved or cleared by the [place] FDA and has been authorized for detection and/or diagnosis of SARS-CoV-2 by FDA under an Emergency Use Authorization (EUA). This EUA will remain in effect (meaning this test can be used) for the duration of the COVID-19 declaration under Section 564(b)(1) of the Act, 21 U.S.C. section 360bbb-3(b)(1), unless the authorization is terminated or revoked.  Performed at [facility], [address], [place], [place] [address]      Time coordinating discharge: 35  minutes  SIGNED:   Glade Lloyd, MD  [facility] 09/28/2020, 3:51 PM

## 2020-09-28 NOTE — Progress Notes (Signed)
Patient ID: Zachary [name], male   DOB: 1970-06-17, 50 y.o.   MRN: [id number]  PROGRESS NOTE    Zachary [name]  KGM:[id number] DOB: [birth year] DOA: 09/25/2020 PCP: Pcp, No   Brief Narrative:  50 y.o. male with medical history significant for biliopancreatic diversion with duodenal switch, anemia requiring blood transfusion, chronic back and lower extremity pain, PTSD, bipolar, depression who presented for evaluation of suicidal ideation.  He was found to have hemoglobin of 5.3 with FOBT positive.  Psychiatry and GI were consulted.  Assessment & Plan:  Acute on chronic symptomatic anemia Iron deficiency anemia -Presented with hemoglobin of 5.3.  Prior hemoglobin in our system was in the 8's in 2018. -Status post 4 units packed red cell transfusion.  Hemoglobin 7.7 today.  Monitor H&H. -Continue IV Protonix.     -GI planning for possible EGD/colonoscopy today.  Suicidal ideation -Continue Software engineer.   - Psychiatry following: Will need inpatient psychiatric hospitalization once medically stable. -He is currently IVC'd -Continue Klonopin and olanzapine as per psychiatry.  Morbid obesity status post gastric bypass in the past  -outpatient follow-up  Unintentional weight loss -CT of the abdomen and pelvis showed no acute intra-abdominal pathology  Cholelithiasis -Might need outpatient general surgery evaluation  Chronic back pain -Apparently has chronic back pain.  Continue current pain medications.   DVT prophylaxis: SCDs Code Status: Full Family Communication: None at bedside Disposition Plan: Status is: Inpatient  Remains inpatient appropriate because:Inpatient level of care appropriate due to severity of illness  Dispo: The patient is from: Home              Anticipated d/c is to: Inpatient psychiatric hospital once cleared by GI              Patient currently is not medically stable to d/c.   Difficult to place patient No   Consultants:  Psychiatry/gastroenterology  Procedures: None  Antimicrobials: None   Subjective: Patient refused to talk to me or see me on 09/27/2020.  Hence I did not evaluate him today.  I have reviewed his chart electronically and discussed with nursing staff.  He intermittently refuses to have colonoscopy done. Objective: Vitals:   09/27/20 1100 09/27/20 1338 09/27/20 2343 09/28/20 0736  BP:  137/71 132/75 137/67  Pulse:  62 (!) 58 (!) 54  Resp:  14 16 20   Temp:  98.3 F (36.8 C) 98.3 F (36.8 C) 97.8 F (36.6 C)  TempSrc:  Oral Oral Oral  SpO2:  99% 100% 100%  Weight: 122.8 kg     Height:        Intake/Output Summary (Last 24 hours) at 09/28/2020 1028 Last data filed at 09/28/2020 0023 Gross per 24 hour  Intake 2240 ml  Output 3 ml  Net 2237 ml    Filed Weights   09/25/20 1733 09/27/20 1100  Weight: 136 kg 122.8 kg    Examination: I have reviewed his vitals myself.  Patient was not examined physically  Data Reviewed: I have personally reviewed following labs and imaging studies  CBC: Recent Labs  Lab 09/25/20 1755 09/26/20 0506 09/26/20 1711 09/27/20 0444 09/28/20 0440  WBC 5.4 4.2  --  4.4 4.0  NEUTROABS 3.8  --   --  3.2 2.2  HGB 5.3* 6.0* 9.4* 8.0* 7.7*  HCT 21.6* 22.3* 32.6* 28.8* 27.1*  MCV 70.4* 72.6*  --  76.2* 76.1*  PLT 162 125*  --  118* 112*    Basic Metabolic Panel: Recent Labs  Lab  09/25/20 1755 09/26/20 0506 09/27/20 0444 09/28/20 0440  NA 142 144 139 142  K 4.4 4.2 4.1 4.0  CL 118* 118* 112* 112*  CO2 21* 22 24 26   GLUCOSE 87 87 97 84  BUN 24* 25* 22* 17  CREATININE 1.07 0.98 1.00 0.88  CALCIUM 8.0* 8.0* 7.7* 7.8*  MG  --   --  1.8 1.9    GFR: Estimated Creatinine Clearance: 130 mL/min (by C-G formula based on SCr of 0.88 mg/dL). Liver Function Tests: Recent Labs  Lab 09/25/20 1755  AST 16  ALT 19  ALKPHOS 128*  BILITOT 0.5  PROT 5.7*  ALBUMIN 2.6*    No results for input(s): LIPASE, AMYLASE in the last 168 hours. No  results for input(s): AMMONIA in the last 168 hours. Coagulation Profile: Recent Labs  Lab 09/25/20 1755  INR 1.3*    Cardiac Enzymes: No results for input(s): CKTOTAL, CKMB, CKMBINDEX, TROPONINI in the last 168 hours. BNP (last 3 results) No results for input(s): PROBNP in the last 8760 hours. HbA1C: No results for input(s): HGBA1C in the last 72 hours. CBG: No results for input(s): GLUCAP in the last 168 hours. Lipid Profile: No results for input(s): CHOL, HDL, LDLCALC, TRIG, CHOLHDL, LDLDIRECT in the last 72 hours. Thyroid Function Tests: Recent Labs    09/27/20 0444  TSH 4.559*    Anemia Panel: Recent Labs    09/25/20 1858  VITAMINB12 517  FOLATE 8.0  FERRITIN 2*  TIBC 312  IRON 16*  RETICCTPCT 1.5    Sepsis Labs: No results for input(s): PROCALCITON, LATICACIDVEN in the last 168 hours.  Recent Results (from the past 240 hour(s))  Resp Panel by RT-PCR (Flu A&B, Covid) Nasopharyngeal Swab     Status: None   Collection Time: 09/25/20  5:55 PM   Specimen: Nasopharyngeal Swab; Nasopharyngeal(NP) swabs in vial transport medium  Result Value Ref Range Status   SARS Coronavirus 2 by RT PCR NEGATIVE NEGATIVE Final    Comment: (NOTE) SARS-CoV-2 target nucleic acids are NOT DETECTED.  The SARS-CoV-2 RNA is generally detectable in upper respiratory specimens during the acute phase of infection. The lowest concentration of SARS-CoV-2 viral copies this assay can detect is 138 copies/mL. A negative result does not preclude SARS-Cov-2 infection and should not be used as the sole basis for treatment or other patient management decisions. A negative result may occur with  improper specimen collection/handling, submission of specimen other than nasopharyngeal swab, presence of viral mutation(s) within the areas targeted by this assay, and inadequate number of viral copies(<138 copies/mL). A negative result must be combined with clinical observations, patient history, and  epidemiological information. The expected result is Negative.  Fact Sheet for Patients:  09/27/20  Fact Sheet for Healthcare Providers:  BloggerCourse.com  This test is no t yet approved or cleared by the [place] FDA and  has been authorized for detection and/or diagnosis of SARS-CoV-2 by FDA under an Emergency Use Authorization (EUA). This EUA will remain  in effect (meaning this test can be used) for the duration of the COVID-19 declaration under Section 564(b)(1) of the Act, 21 U.S.C.section 360bbb-3(b)(1), unless the authorization is terminated  or revoked sooner.       Influenza A by PCR NEGATIVE NEGATIVE Final   Influenza B by PCR NEGATIVE NEGATIVE Final    Comment: (NOTE) The Xpert Xpress SARS-CoV-2/FLU/RSV plus assay is intended as an aid in the diagnosis of influenza from Nasopharyngeal swab specimens and should not be used as a  sole basis for treatment. Nasal washings and aspirates are unacceptable for Xpert Xpress SARS-CoV-2/FLU/RSV testing.  Fact Sheet for Patients: BloggerCourse.com  Fact Sheet for Healthcare Providers: SeriousBroker.it  This test is not yet approved or cleared by the [place] FDA and has been authorized for detection and/or diagnosis of SARS-CoV-2 by FDA under an Emergency Use Authorization (EUA). This EUA will remain in effect (meaning this test can be used) for the duration of the COVID-19 declaration under Section 564(b)(1) of the Act, 21 U.S.C. section 360bbb-3(b)(1), unless the authorization is terminated or revoked.  Performed at [facility], [address] [place], [place] [address]           Radiology Studies: No results found.      Scheduled Meds:  (feeding supplement) PROSource Plus  30 mL Oral TID BM   sodium chloride   Intravenous Once   calcium carbonate  1 tablet Oral QID    clonazepam  1 mg Oral QHS   feeding supplement  1 Container Oral TID BM   multivitamin with minerals  1 tablet Oral BID   OLANZapine zydis  5 mg Oral QHS   pantoprazole (PROTONIX) IV  40 mg Intravenous Q12H   sodium chloride flush  3 mL Intravenous Q12H   thiamine  100 mg Oral Daily   Continuous Infusions:  sodium chloride     sodium chloride            Glade Lloyd, MD [facility] 09/28/2020, 10:28 AM

## 2020-09-28 NOTE — Progress Notes (Signed)
Pt had BM X3  after completion of bowel prep. Clay colored  and loose.

## 2020-09-29 ENCOUNTER — Encounter (HOSPITAL_COMMUNITY): Payer: Self-pay | Admitting: Psychiatry

## 2020-09-29 ENCOUNTER — Inpatient Hospital Stay (HOSPITAL_COMMUNITY)
Admission: AD | Admit: 2020-09-29 | Discharge: 2020-10-02 | DRG: 885 | Disposition: A | Discharge status: Home / Self Care | Payer: Federal, State, Local not specified - Other | Source: Intra-hospital | Attending: Emergency Medicine | Admitting: Emergency Medicine

## 2020-09-29 ENCOUNTER — Other Ambulatory Visit: Payer: Self-pay

## 2020-09-29 DIAGNOSIS — Z9151 Personal history of suicidal behavior: Secondary | ICD-10-CM | POA: Diagnosis not present

## 2020-09-29 DIAGNOSIS — R45851 Suicidal ideations: Secondary | ICD-10-CM | POA: Diagnosis present

## 2020-09-29 DIAGNOSIS — M797 Fibromyalgia: Secondary | ICD-10-CM | POA: Diagnosis present

## 2020-09-29 DIAGNOSIS — G894 Chronic pain syndrome: Secondary | ICD-10-CM | POA: Diagnosis present

## 2020-09-29 DIAGNOSIS — R634 Abnormal weight loss: Secondary | ICD-10-CM

## 2020-09-29 DIAGNOSIS — Z811 Family history of alcohol abuse and dependence: Secondary | ICD-10-CM | POA: Diagnosis not present

## 2020-09-29 DIAGNOSIS — Z9884 Bariatric surgery status: Secondary | ICD-10-CM

## 2020-09-29 DIAGNOSIS — Z6372 Alcoholism and drug addiction in family: Secondary | ICD-10-CM

## 2020-09-29 DIAGNOSIS — F4310 Post-traumatic stress disorder, unspecified: Secondary | ICD-10-CM | POA: Diagnosis present

## 2020-09-29 DIAGNOSIS — Z79899 Other long term (current) drug therapy: Secondary | ICD-10-CM | POA: Diagnosis not present

## 2020-09-29 DIAGNOSIS — F3130 Bipolar disorder, current episode depressed, mild or moderate severity, unspecified: Secondary | ICD-10-CM | POA: Diagnosis present

## 2020-09-29 DIAGNOSIS — Z6838 Body mass index (BMI) 38.0-38.9, adult: Secondary | ICD-10-CM | POA: Diagnosis not present

## 2020-09-29 DIAGNOSIS — Z833 Family history of diabetes mellitus: Secondary | ICD-10-CM

## 2020-09-29 DIAGNOSIS — D509 Iron deficiency anemia, unspecified: Secondary | ICD-10-CM | POA: Diagnosis present

## 2020-09-29 DIAGNOSIS — F5104 Psychophysiologic insomnia: Secondary | ICD-10-CM | POA: Diagnosis present

## 2020-09-29 DIAGNOSIS — F603 Borderline personality disorder: Secondary | ICD-10-CM | POA: Diagnosis present

## 2020-09-29 DIAGNOSIS — K219 Gastro-esophageal reflux disease without esophagitis: Secondary | ICD-10-CM | POA: Diagnosis present

## 2020-09-29 DIAGNOSIS — F329 Major depressive disorder, single episode, unspecified: Secondary | ICD-10-CM | POA: Diagnosis present

## 2020-09-29 DIAGNOSIS — Z818 Family history of other mental and behavioral disorders: Secondary | ICD-10-CM

## 2020-09-29 DIAGNOSIS — I10 Essential (primary) hypertension: Secondary | ICD-10-CM | POA: Diagnosis present

## 2020-09-29 MED ORDER — ONDANSETRON HCL 4 MG PO TABS
4.0000 mg | ORAL_TABLET | Freq: Three times a day (TID) | ORAL | Status: DC | PRN
  Administered 2020-09-30 – 2020-10-01 (×2): 4 mg via ORAL
  Filled 2020-09-29 (×2): qty 1

## 2020-09-29 MED ORDER — ACETAMINOPHEN 325 MG PO TABS
650.0000 mg | ORAL_TABLET | Freq: Four times a day (QID) | ORAL | Status: DC | PRN
Start: 2020-09-29 — End: 2020-10-02
  Administered 2020-10-01 – 2020-10-02 (×2): 650 mg via ORAL
  Filled 2020-09-29 (×2): qty 2

## 2020-09-29 MED ORDER — ENSURE ENLIVE PO LIQD
237.0000 mL | Freq: Two times a day (BID) | ORAL | Status: DC
  Administered 2020-09-30 – 2020-10-02 (×3): 237 mL via ORAL
  Filled 2020-09-29 (×9): qty 237

## 2020-09-29 MED ORDER — FERROUS SULFATE 325 (65 FE) MG PO TABS
325.0000 mg | ORAL_TABLET | Freq: Every day | ORAL | Status: DC
  Administered 2020-09-30: 325 mg via ORAL
  Filled 2020-09-29 (×5): qty 1

## 2020-09-29 MED ORDER — MAGNESIUM HYDROXIDE 400 MG/5ML PO SUSP: 30.0000 mL | Freq: Every day | ORAL | Status: DC | PRN

## 2020-09-29 MED ORDER — CALCIUM CARBONATE-VITAMIN D 500-200 MG-UNIT PO TABS
2.0000 | ORAL_TABLET | Freq: Two times a day (BID) | ORAL | Status: DC
  Administered 2020-09-30 – 2020-10-02 (×5): 2 via ORAL
  Filled 2020-09-29 (×10): qty 2

## 2020-09-29 MED ORDER — ADULT MULTIVITAMIN W/MINERALS CH
1.0000 | ORAL_TABLET | Freq: Every day | ORAL | Status: DC
  Administered 2020-09-30 – 2020-10-02 (×3): 1 via ORAL
  Filled 2020-09-29 (×6): qty 1

## 2020-09-29 MED ORDER — ALUM & MAG HYDROXIDE-SIMETH 200-200-20 MG/5ML PO SUSP: 30.0000 mL | ORAL | Status: DC | PRN

## 2020-09-29 MED ORDER — TRAZODONE HCL 50 MG PO TABS: 50.0000 mg | ORAL_TABLET | Freq: Every evening | ORAL | Status: DC | PRN

## 2020-09-29 MED ORDER — PANTOPRAZOLE SODIUM 40 MG PO TBEC
40.0000 mg | DELAYED_RELEASE_TABLET | Freq: Every day | ORAL | Status: DC
  Filled 2020-09-29: qty 1

## 2020-09-29 MED ORDER — PROSOURCE PLUS PO LIQD
30.0000 mL | Freq: Three times a day (TID) | ORAL | Status: DC
  Filled 2020-09-29 (×2): qty 30

## 2020-09-29 MED ORDER — THIAMINE HCL 100 MG PO TABS
100.0000 mg | ORAL_TABLET | Freq: Every day | ORAL | Status: DC
  Administered 2020-09-30 – 2020-10-02 (×3): 100 mg via ORAL
  Filled 2020-09-29 (×8): qty 1

## 2020-09-29 MED ORDER — BOOST / RESOURCE BREEZE PO LIQD CUSTOM
1.0000 | Freq: Three times a day (TID) | ORAL | Status: DC
  Administered 2020-09-29: 1 via ORAL
  Filled 2020-09-29 (×2): qty 1

## 2020-09-29 MED ORDER — CLONAZEPAM 1 MG PO TABS
1.0000 mg | ORAL_TABLET | Freq: Every day | ORAL | Status: DC
  Administered 2020-09-29: 1 mg via ORAL
  Filled 2020-09-29: qty 1

## 2020-09-29 MED ORDER — DIPHENHYDRAMINE HCL 25 MG PO CAPS
25.0000 mg | ORAL_CAPSULE | Freq: Four times a day (QID) | ORAL | Status: DC | PRN
  Administered 2020-09-29: 25 mg via ORAL
  Filled 2020-09-29: qty 1

## 2020-09-29 MED ORDER — OLANZAPINE 5 MG PO TBDP
5.0000 mg | ORAL_TABLET | Freq: Every day | ORAL | Status: DC
  Administered 2020-09-29: 5 mg via ORAL
  Filled 2020-09-29 (×3): qty 1

## 2020-09-29 MED ORDER — LORAZEPAM 1 MG PO TABS: 1.0000 mg | ORAL_TABLET | ORAL | Status: DC | PRN

## 2020-09-29 MED ORDER — LOPERAMIDE HCL 2 MG PO CAPS
4.0000 mg | ORAL_CAPSULE | Freq: Four times a day (QID) | ORAL | Status: DC | PRN
  Administered 2020-09-30 (×2): 4 mg via ORAL
  Filled 2020-09-29 (×2): qty 2

## 2020-09-29 MED ORDER — PANTOPRAZOLE SODIUM 40 MG PO TBEC
40.0000 mg | DELAYED_RELEASE_TABLET | Freq: Two times a day (BID) | ORAL | Status: DC
  Administered 2020-09-30 – 2020-10-02 (×5): 40 mg via ORAL
  Filled 2020-09-29 (×11): qty 1

## 2020-09-29 MED ORDER — HYDROXYZINE HCL 25 MG PO TABS: 25.0000 mg | ORAL_TABLET | Freq: Three times a day (TID) | ORAL | Status: DC | PRN

## 2020-09-29 MED ORDER — ZIPRASIDONE MESYLATE 20 MG IM SOLR
20.0000 mg | INTRAMUSCULAR | Status: DC | PRN
Start: 2020-09-29 — End: 2020-10-02

## 2020-09-29 MED ORDER — OLANZAPINE 5 MG PO TBDP: 5.0000 mg | ORAL_TABLET | Freq: Three times a day (TID) | ORAL | Status: DC | PRN

## 2020-09-29 NOTE — Progress Notes (Signed)
Pt accepted to [facility] 403-1   Patient meets inpatient criteria per Edrick Kins, PA-C   Dr.Singleton is the attending provider.    Call report to [phone]    Santo Held, RN @ [facility] notified.     Pt scheduled  to arrive at [facility] TODAY after 1400.   Damita Dunnings, MSW, LCSW-A  1:03 PM 09/29/2020

## 2020-09-29 NOTE — Progress Notes (Addendum)
   09/29/20 1540  Vital Signs  Temp 98.8 F (37.1 C)  Temp Source Oral  Pulse Rate 93  Pulse Rate Source Monitor  Resp 18  BP (!) 142/79  BP Location Left Arm  BP Method Automatic  Patient Position (if appropriate) Sitting  Oxygen Therapy  SpO2 100 %  Pain Assessment  Pain Scale 0-10  Pain Score 6  Pain Type Chronic pain  Pain Location Back  Pain Intervention(s) RN made aware  Complaints & Interventions  Complains of Agitation;Anxiety;Insomnia  Height and Weight  Height 5\' 11"  (1.803 m)  Weight 124.7 kg  Type of Scale Used Standing  Type of Weight Actual  BSA (Calculated - sq m) 2.5 sq meters  BMI (Calculated) 38.37  Weight in (lb) to have BMI = 25 178.9   Initial Nursing Assessment  D: Patient is a 50 y.o. Caucasian male IVC'd from [facility] for SI.  Pt.has been a pt. At [facility]  in 2018. Pt. Reported a past dx of Anxiety, Bipolar, PTSD, panic disorder, and borderline personality. Pt. 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 Pt. Stated that he recently lost 40 pounds and he doesn't know why. Pt. Reports falling at home and is a fall risk. Pt. Is very slow and unsteady on his feet. Pt. Reported that he can only walk 25 feet but in the nurse to nurse report it was reported that physical therapy cleared him physically to walk 50 feet. PT is dependent on his wheel chair because of his decreased walking ability and requests a shower chair to help him take a shower. . Pt. Uses a cane that is located in the interview room, behind the  curtain. While at [facility] pt's  hemoglobin was around 5.3 then went up 7.7. when he was given 4 units of blood. Pt reported that he has chronic anemia.  PT has multiple loose skin around abdomen, hips and thighs with some pink moist areas under the skin folds. Pt. Has a spider bite on the back of his right leg. Pt. has approximately 16 razor cuts to his both forearms that are scabbed over. Pt. Reported he tried to cut his arm as  Suicide attempt and that he has had a the same cutting SUA in the past.  Pt. Stated that his suicidal thoughts started in high school as he was bullied. Pt stated that he has SI everyday all day. Pt. Stated that his girlfriend that he lives with is very supportive and is a protecting factor. Pt. Stated that he is trying to get on disability so that he can pay for consistent healthcare. Pt. Denies drug and alcohol use. Pt. Reported he only has VAH when he hasn't gotten enough sleep or if he takes too much sleeping medications. A:   Support and encouragement provided Routine safety checks conducted every 15 minutes. Patient  Informed to notify staff with any concerns.   R: Pt. Verbally contracts for safety.  Safety maintained.

## 2020-09-29 NOTE — TOC Transition Note (Signed)
Transition of Care [facility]) - CM/SW Discharge Note   Patient Details  Name: Zachary [name] MRN: [id number] Date of Birth: Jul 24, 1970  Transition of Care [facility]) CM/SW Contact:  Ida Rogue, LCSW Phone Number: 09/29/2020, 12:54 PM   Clinical Narrative:   Patient who is stable for d/c will be transported by sheriff to [facility]. Mr Zangara has been accepted to [id number] to the services of Dr. Mason Jim and can arrive anytime after 2 pm. Transportation arranged.  Nursing, report can be called to [phone].  TOC sign off.    Final next level of care: [facility] Barriers to Discharge: No Barriers Identified   Patient Goals and CMS Choice        Discharge Placement                       Discharge Plan and Services                                     Social Determinants of Health (SDOH) Interventions     Readmission Risk Interventions No flowsheet data found.

## 2020-09-29 NOTE — Progress Notes (Signed)
Patient's significant other brought a red and black bag filled with pt's belongings such as t-shirts, ensure, a book, and cornstarch. Items were placed in a secure location outside of the room.

## 2020-09-29 NOTE — [facility] Group Notes (Signed)
Adult Psychoeducational Group Note  Date:  09/29/2020 Time:  9:06 PM  Group Topic/Focus:  Identifying Needs:   The focus of this group is to help patients identify their personal needs that have been historically problematic and identify healthy behaviors to address their needs.  Participation Level:  Active  Participation Quality:  Attentive  Affect:  Appropriate  Cognitive:  Alert  Insight: Good  Engagement in Group:  Engaged  Modes of Intervention:  Discussion  Additional Comments  Zachary [name] 09/29/2020, 9:06 PM

## 2020-09-29 NOTE — Tx Team (Signed)
Initial Treatment Plan 09/29/2020 5:33 PM [facility] EXN:[id number]    PATIENT STRESSORS: Financial difficulties   Health problems     PATIENT STRENGTHS: Active sense of humor  Capable of independent living  Motivation for treatment/growth  Supportive family/friends    PATIENT IDENTIFIED PROBLEMS: "I am stressed about getting disability and medicaid to be able to see a doctor on a regular basis"  Coping skills                   DISCHARGE CRITERIA:  Ability to meet basic life and health needs Improved stabilization in mood, thinking, and/or behavior Reduction of life-threatening or endangering symptoms to within safe limits Safe-care adequate arrangements made Verbal commitment to aftercare and medication compliance  PRELIMINARY DISCHARGE PLAN: Attend PHP/IOP Outpatient therapy Return to previous living arrangement  PATIENT/FAMILY INVOLVEMENT: This treatment plan has been presented to and reviewed with the patient, Zachary [name]. The patient has been given the opportunity to ask questions and make suggestions.  Sofie Hartigan, RN 09/29/2020, 5:33 PM

## 2020-09-29 NOTE — Progress Notes (Signed)
Patient ID: Zachary [name], male   DOB: [birth year], 50 y.o.   MRN: [id number] Patient is waiting to be discharged to [facility].  He is currently medically stable for discharge.  Psychiatry following.  Please refer to the full discharge summary done by me on 09/28/2020 for full details.

## 2020-09-29 NOTE — Progress Notes (Signed)
Patient information has been sent to [facility] via secure chat to review for potential admission. Patient meets inpatient criteria per Margaretha Seeds, MD.   Situation ongoing, CSW will continue to monitor progress.    Signed:  Damita Dunnings, MSW, LCSW-A  09/29/2020 10:43 AM

## 2020-09-29 NOTE — Progress Notes (Signed)
   09/29/20 2120  Psych Admission Type (Psych Patients Only)  Admission Status Involuntary  Psychosocial Assessment  Patient Complaints Anxiety;Hopelessness;Depression  Eye Contact Fair  Facial Expression Sullen;Sad  Affect Appropriate to circumstance;Sullen  Speech Logical/coherent;Soft  Interaction Submissive  Motor Activity Fidgety  Appearance/Hygiene Disheveled  Behavior Characteristics Cooperative;Appropriate to situation  Mood Sullen;Pleasant  Thought Process  Coherency Concrete thinking  Content Blaming others  Delusions WDL  Perception Hallucinations  Hallucination Auditory;Visual  Judgment Poor  Confusion None  Danger to Self  Current suicidal ideation? Passive  Self-Injurious Behavior No self-injurious ideation or behavior indicators observed or expressed   Agreement Not to Harm Self Yes  Description of Agreement Verbal contract  Danger to Others  Danger to Others None reported or observed

## 2020-09-30 ENCOUNTER — Encounter (HOSPITAL_COMMUNITY): Payer: Self-pay

## 2020-09-30 DIAGNOSIS — F3130 Bipolar disorder, current episode depressed, mild or moderate severity, unspecified: Secondary | ICD-10-CM | POA: Diagnosis present

## 2020-09-30 LAB — CBC
HCT: 30.2 % — ABNORMAL LOW (ref 39.0–52.0)
Hemoglobin: 8.1 g/dL — ABNORMAL LOW (ref 13.0–17.0)
MCH: 21.4 pg — ABNORMAL LOW (ref 26.0–34.0)
MCHC: 26.8 g/dL — ABNORMAL LOW (ref 30.0–36.0)
MCV: 79.7 fL — ABNORMAL LOW (ref 80.0–100.0)
Platelets: 104 10*3/uL — ABNORMAL LOW (ref 150–400)
RBC: 3.79 MIL/uL — ABNORMAL LOW (ref 4.22–5.81)
RDW: 25 % — ABNORMAL HIGH (ref 11.5–15.5)
WBC: 4.5 10*3/uL (ref 4.0–10.5)
nRBC: 0 % (ref 0.0–0.2)

## 2020-09-30 LAB — LIPID PANEL
Cholesterol: 83 mg/dL (ref 0–200)
HDL: 40 mg/dL — ABNORMAL LOW
LDL Cholesterol: 38 mg/dL (ref 0–99)
Total CHOL/HDL Ratio: 2.1 ratio
Triglycerides: 24 mg/dL
VLDL: 5 mg/dL (ref 0–40)

## 2020-09-30 LAB — TSH: TSH: 3.691 u[IU]/mL (ref 0.350–4.500)

## 2020-09-30 LAB — FERRITIN: Ferritin: 4 ng/mL — ABNORMAL LOW (ref 24–336)

## 2020-09-30 LAB — T4, FREE: Free T4: 0.9 ng/dL (ref 0.61–1.12)

## 2020-09-30 SURGERY — EGD (ESOPHAGOGASTRODUODENOSCOPY)
Anesthesia: Monitor Anesthesia Care

## 2020-09-30 MED ORDER — MIRTAZAPINE 15 MG PO TBDP
15.0000 mg | ORAL_TABLET | Freq: Every day | ORAL | Status: DC
  Administered 2020-10-01: 15 mg via ORAL
  Filled 2020-09-30 (×3): qty 1

## 2020-09-30 MED ORDER — MIRTAZAPINE 15 MG PO TBDP
15.0000 mg | ORAL_TABLET | Freq: Every day | ORAL | Status: DC
  Filled 2020-09-30 (×2): qty 1

## 2020-09-30 MED ORDER — CLONAZEPAM 0.25 MG PO TBDP
1.0000 mg | ORAL_TABLET | Freq: Every day | ORAL | Status: DC
  Administered 2020-09-30 – 2020-10-01 (×2): 1 mg via ORAL
  Filled 2020-09-30 (×2): qty 4

## 2020-09-30 MED ORDER — OLANZAPINE 5 MG PO TBDP
5.0000 mg | ORAL_TABLET | Freq: Every day | ORAL | Status: DC
  Administered 2020-09-30 – 2020-10-01 (×2): 5 mg via ORAL
  Filled 2020-09-30 (×4): qty 1

## 2020-09-30 MED ORDER — FERROUS SULFATE 325 (65 FE) MG PO TABS
325.0000 mg | ORAL_TABLET | Freq: Two times a day (BID) | ORAL | Status: DC
  Administered 2020-09-30 – 2020-10-02 (×4): 325 mg via ORAL
  Filled 2020-09-30 (×8): qty 1

## 2020-09-30 NOTE — Plan of Care (Signed)
  Problem: Activity: Goal: Interest or engagement in leisure activities will improve Outcome: Progressing   Problem: Self-Concept: Goal: Level of anxiety will decrease Outcome: Not Progressing   Problem: Education: Goal: Knowledge of the prescribed therapeutic regimen will improve Outcome: Not Progressing

## 2020-09-30 NOTE — Group Note (Signed)
[facility] LCSW Group Therapy Note   Group Date: 09/30/2020 Start Time: 1300 End Time: 1400   Type of Therapy/Topic:  Group Therapy:  Emotion Regulation  Participation Level:  None     Description of Group:    The purpose of this group is to assist patients in learning to regulate negative emotions and experience positive emotions. Patients will be guided to discuss ways in which they have been vulnerable to their negative emotions. These vulnerabilities will be juxtaposed with experiences of positive emotions or situations, and patients challenged to use positive emotions to combat negative ones. Special emphasis will be placed on coping with negative emotions in conflict situations, and patients will process healthy conflict resolution skills.  Therapeutic Goals: Patient will identify two positive emotions or experiences to reflect on in order to balance out negative emotions:  Patient will label two or more emotions that they find the most difficult to experience:  Patient will be able to demonstrate positive conflict resolution skills through discussion or role plays:   Summary of Patient Progress: Did not participate.        Therapeutic Modalities:   Cognitive Behavioral Therapy Feelings Identification Dialectical Behavioral Therapy   Clovis Warwick A Arnell Mausolf, LCSW 
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
## 2020-09-30 NOTE — [facility] Group Notes (Signed)
Pt was given pamphlet on personal acceptance for psycho-ed group and was asked to read, review and ask questions if needed about the topic. Pt was encouraged to take notes as well. 

## 2020-09-30 NOTE — Progress Notes (Signed)
Pt denies SI/HI/AVH.  Pt describes feeling anxiously because he continues to have lose weight and does not know why he is losing the weight. Pt also admits to significant stress because of fiancial issues.  Pt took medications  without incident.  Pt remains safe with q 15 min checks in place.  09/30/20 1100  Psych Admission Type (Psych Patients Only)  Admission Status Involuntary  Psychosocial Assessment  Patient Complaints Anxiety;Hopelessness;Sadness  Eye Contact Fair  Facial Expression Sullen;Sad  Affect Appropriate to circumstance;Sullen  Speech Logical/coherent;Soft  Interaction Submissive  Motor Activity Fidgety  Appearance/Hygiene Unremarkable  Behavior Characteristics Cooperative;Anxious  Mood Anxious;Sad  Thought Process  Coherency WDL  Content WDL  Delusions WDL  Perception WDL  Hallucination None reported or observed  Judgment Limited  Confusion None  Danger to Self  Current suicidal ideation? Denies  Self-Injurious Behavior No self-injurious ideation or behavior indicators observed or expressed   Agreement Not to Harm Self Yes  Description of Agreement Verbal contract  Danger to Others  Danger to Others None reported or observed

## 2020-09-30 NOTE — [facility] Group Notes (Signed)
[facility] Group Notes:  (Nursing/MHT/Case Management/Adjunct)  Date:  09/30/2020  Time:  1:30 PM  Type of Therapy:   The focus of this group is to help patients establish daily goals to achieve during treatment and discuss how the patient can incorporate goal setting into their daily lives to aide in recovery.  Participation Level:  Did Not Attend  Participation Quality:   Did Not Attend  Affect:   Did Not Attend  Cognitive:   Did Not Attend  Insight:  None  Engagement in Group:   Did Not Attend  Modes of Intervention:   Did Not Attend  Summary of Progress/Problems:  Zachary [name] Zachary [name] 09/30/2020, 1:30 PM

## 2020-09-30 NOTE — Group Note (Signed)
Recreation Therapy Group Note   Group Topic:Leisure Education  Group Date: 09/30/2020 Start Time: 1000 End Time: 1050 Facilitators: Caroll Rancher, LRT/CTRS Location: [address] Dayroom  Goal Area(s) Addresses:  Patient will identify positive leisure activities for use post discharge. Patient will identify at least one positive benefit of participation in leisure activities.   Group Description: Patients will be divided into 2 teams.  Each team will take turns pulling a slip of paper from the can with an activity on it.  Members of the teams will take turns drawing what is on the paper.  Each team gets one minute to draw and guess what the picture is.  If the team gets the correct answer, they receive a point.  If the team doesn't get the answer, the other team can steal the point.  The team with the most points wins the game.   Affect/Mood: N/A   Participation Level: Did not attend    Clinical Observations/Individualized Feedback: Pt did not attend group.    Plan: Continue to engage patient in RT group sessions 2-3x/week.   Caroll Rancher, LRT/CTRS 09/30/2020 1:00 PM

## 2020-09-30 NOTE — Progress Notes (Signed)
Nutrition Brief Note  Patient identified on the Malnutrition Screening Tool (MST) Report  Wt Readings from Last 15 Encounters:  09/29/20 124.7 kg  09/27/20 122.8 kg  11/11/16 136.1 kg  11/05/16 131.5 kg  08/28/14 (!) 147.9 kg  06/11/14 (!) 154.2 kg  01/01/13 (!) 171.5 kg    Body mass index is 38.35 kg/m. Patient meets criteria for obesity based on current BMI. Weight yesterday was 274 lb and weight on 11/11/16 was 299 lb.   He was admitted for bipolar disorder, PTSD, MDD, and SI. He shared that he had gastric bypass 15 years ago and that due to altered GI absorption his psych medications are not consistently effective.   Current diet order is Regular and he is eating as desired for meals and snacks at this time. Labs and medications reviewed.   Ensure Plus ordered BID at the time of admission per ONS protocol. Each supplement provides 350 kcal and 13 grams protein.   No nutrition interventions warranted at this time. If nutrition issues arise, please consult RD.      Trenton Gammon, MS, RD, LDN, CNSC Inpatient Clinical Dietitian RD pager # available in [facility]  After hours/weekend pager # available in [facility]

## 2020-09-30 NOTE — Progress Notes (Signed)
Recreation Therapy Notes  INPATIENT RECREATION THERAPY ASSESSMENT  Patient Details Name: AMARIAN BOTERO MRN: [id number] DOB: [birth year] Today's Date: 09/30/2020       Information Obtained From: Patient  Able to Participate in Assessment/Interview: Yes  Patient Presentation: Oriented, Groomed  Reason for Admission (Per Patient): Suicide Attempt  Patient Stressors: Other (Comment) (Pain, Mental health)  Coping Skills:   Isolation, Self-Injury, Music, Impulsivity, Talk, Avoidance, Read, Hot Bath/Shower  Leisure Interests (2+):  Individual - Reading, Individual - Other (Comment) Adriana Simas)  Frequency of Recreation/Participation: Other (Comment) (Reading- As often as possible; Cook-Daily)  Awareness of Community Resources:  Yes  [employer]:  Library, [employer], Research scientist (physical sciences), Tree surgeon, Public affairs consultant  Current Use: No  If no, Barriers?: Other (Comment) (Mobility Issues)  Expressed Interest in [employer] Information: No  [employer] of Residence:  [place]  Patient Main Form of Transportation: Other (Comment) (Girlfriend)  Patient Strengths:  Creative, Caring, Try to put others first  Patient Identified Areas of Improvement:  Put self first  Patient Goal for Hospitalization:  "learning to cope better, manage symptoms"  Current SI (including self-harm):  No  Current HI:  No  Current AVH: No  Staff Intervention Plan: Group Attendance, Collaborate with Interdisciplinary Treatment Team  Consent to Intern Participation: N/A   Caroll Rancher, LRT/CTRS Lillia Abed, Gizzelle Lacomb A 09/30/2020, 1:16 PM

## 2020-09-30 NOTE — BH IP Treatment Plan (Signed)
Interdisciplinary Treatment and Diagnostic Plan Update  09/30/2020 Time of Session:  Zachary [name] MRN: [id number]  Principal Diagnosis: <principal problem not specified>  Secondary Diagnoses: Active Problems:   MDD (major depressive disorder)   Current Medications:  Current Facility-Administered Medications  Medication Dose Route Frequency Provider Last Rate Last Admin   acetaminophen (TYLENOL) tablet 650 mg  650 mg Oral Q6H PRN Chalmers Guest, NP       alum & mag hydroxide-simeth (MAALOX/MYLANTA) 200-200-20 MG/5ML suspension 30 mL  30 mL Oral Q4H PRN Chalmers Guest, NP       calcium-vitamin D (OSCAL WITH D) 500-200 MG-UNIT per tablet 2 tablet  2 tablet Oral BID Margorie John W, PA-C       clonazePAM Bobbye [place]) tablet 1 mg  1 mg Oral QHS Chalmers Guest, NP   1 mg at 09/29/20 2120   diphenhydrAMINE (BENADRYL) capsule 25 mg  25 mg Oral Q6H PRN Chalmers Guest, NP   25 mg at 09/29/20 2120   feeding supplement (ENSURE ENLIVE / ENSURE PLUS) liquid 237 mL  237 mL Oral BID BM Nelda Marseille, Amy E, MD       ferrous sulfate tablet 325 mg  325 mg Oral Q breakfast Lovena Le, Cody W, PA-C       hydrOXYzine (ATARAX/VISTARIL) tablet 25 mg  25 mg Oral TID PRN Chalmers Guest, NP       loperamide (IMODIUM) capsule 4 mg  4 mg Oral Q6H PRN Harlow Asa, MD       OLANZapine zydis (ZYPREXA) disintegrating tablet 5 mg  5 mg Oral Q8H PRN Chalmers Guest, NP       And   LORazepam (ATIVAN) tablet 1 mg  1 mg Oral PRN Chalmers Guest, NP       And   ziprasidone (GEODON) injection 20 mg  20 mg Intramuscular PRN Chalmers Guest, NP       magnesium hydroxide (MILK OF MAGNESIA) suspension 30 mL  30 mL Oral Daily PRN Chalmers Guest, NP       multivitamin with minerals tablet 1 tablet  1 tablet Oral Daily Chalmers Guest, NP       OLANZapine zydis (ZYPREXA) disintegrating tablet 5 mg  5 mg Oral QHS Chalmers Guest, NP   5 mg at 09/29/20 2120   ondansetron (ZOFRAN) tablet 4 mg  4 mg Oral Q8H PRN Chalmers Guest, NP        pantoprazole (PROTONIX) EC tablet 40 mg  40 mg Oral BID AC Taylor, Cody W, PA-C       thiamine tablet 100 mg  100 mg Oral Daily Chalmers Guest, NP       traZODone (DESYREL) tablet 50 mg  50 mg Oral QHS PRN Chalmers Guest, NP       PTA Medications: Medications Prior to Admission  Medication Sig Dispense Refill Last Dose   methocarbamol (ROBAXIN) 750 MG tablet Take 750 mg by mouth 3 (three) times daily as needed for muscle spasms.      Nutritional Supplements (ENSURE HIGH PROTEIN PO) Take 1 Bottle by mouth 2 (two) times daily between meals.      clonazePAM (KLONOPIN) 1 MG disintegrating tablet Take 1 tablet (1 mg total) by mouth at bedtime. (Patient taking differently: Take 1 mg by mouth 2 (two) times daily.)      gabapentin (NEURONTIN) 300 MG capsule Take 300 mg by mouth 3 (three) times daily.  Patient Stressors: Financial difficulties   Health problems    Patient Strengths: Active sense of humor  Capable of independent living  Motivation for treatment/growth  Supportive family/friends   Treatment Modalities: Medication Management, Group therapy, Case management,  1 to 1 session with clinician, Psychoeducation, Recreational therapy.   Physician Treatment Plan for Primary Diagnosis: <principal problem not specified> Long Term Goal(s): Improvement in symptoms so as ready for discharge   Short Term Goals: Ability to identify and develop effective coping behaviors will improve Ability to maintain clinical measurements within normal limits will improve Compliance with prescribed medications will improve Ability to identify changes in lifestyle to reduce recurrence of condition will improve Ability to verbalize feelings will improve Ability to disclose and discuss suicidal ideas Ability to demonstrate self-control will improve  Medication Management: Evaluate patient's response, side effects, and tolerance of medication regimen.  Therapeutic Interventions: 1 to 1 sessions, Unit  Group sessions and Medication administration.  Evaluation of Outcomes: Not Met  Physician Treatment Plan for Secondary Diagnosis: Active Problems:   MDD (major depressive disorder)  Long Term Goal(s): Improvement in symptoms so as ready for discharge   Short Term Goals: Ability to identify and develop effective coping behaviors will improve Ability to maintain clinical measurements within normal limits will improve Compliance with prescribed medications will improve Ability to identify changes in lifestyle to reduce recurrence of condition will improve Ability to verbalize feelings will improve Ability to disclose and discuss suicidal ideas Ability to demonstrate self-control will improve     Medication Management: Evaluate patient's response, side effects, and tolerance of medication regimen.  Therapeutic Interventions: 1 to 1 sessions, Unit Group sessions and Medication administration.  Evaluation of Outcomes: Not Met   RN Treatment Plan for Primary Diagnosis: <principal problem not specified> Long Term Goal(s): Knowledge of disease and therapeutic regimen to maintain health will improve  Short Term Goals: Ability to participate in decision making will improve, Ability to verbalize feelings will improve, and Ability to disclose and discuss suicidal ideas  Medication Management: RN will administer medications as ordered by provider, will assess and evaluate patient's response and provide education to patient for prescribed medication. RN will report any adverse and/or side effects to prescribing provider.  Therapeutic Interventions: 1 on 1 counseling sessions, Psychoeducation, Medication administration, Evaluate responses to treatment, Monitor vital signs and CBGs as ordered, Perform/monitor CIWA, COWS, AIMS and Fall Risk screenings as ordered, Perform wound care treatments as ordered.  Evaluation of Outcomes: Not Met   LCSW Treatment Plan for Primary Diagnosis: <principal  problem not specified> Long Term Goal(s): Safe transition to appropriate next level of care at discharge, Engage patient in therapeutic group addressing interpersonal concerns.  Short Term Goals: Engage patient in aftercare planning with referrals and resources, Increase social support, and Increase ability to appropriately verbalize feelings  Therapeutic Interventions: Assess for all discharge needs, 1 to 1 time with Social worker, Explore available resources and support systems, Assess for adequacy in community support network, Educate family and significant other(s) on suicide prevention, Complete Psychosocial Assessment, Interpersonal group therapy.  Evaluation of Outcomes: Not Met   Progress in Treatment: Attending groups: No. Participating in groups: No. Taking medication as prescribed: Yes. Toleration medication: Yes. Family/Significant other contact made: No, will contact:  CSW will obtain consent Patient understands diagnosis: No. Discussing patient identified problems/goals with staff: Yes. Medical problems stabilized or resolved: Yes. Denies suicidal/homicidal ideation: No. Issues/concerns per patient self-inventory: No. Other: None  New problem(s) identified: No, Describe:  None  New  Short Term/Long Term Goal(s):medication stabilization, elimination of SI thoughts, development of comprehensive mental wellness plan.   Patient Goals:  "manage symptoms."   Discharge Plan or Barriers: Patient recently admitted. CSW will continue to follow and assess for appropriate referrals and possible discharge planning.   Reason for Continuation of Hospitalization: Medication stabilization Suicidal ideation  Estimated Length of Stay: 3-5 days   Scribe for Treatment Team: Eliott Nine 09/30/2020 10:29 AM
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
## 2020-09-30 NOTE — [facility] Group Notes (Signed)
Pt. Attended NA meeting.

## 2020-09-30 NOTE — Progress Notes (Signed)
     09/30/20 2115  Psych Admission Type (Psych Patients Only)  Admission Status Involuntary  Psychosocial Assessment  Patient Complaints Irritability (pt stated, "irritated other hospital lost his items, but not gonna take it out on you")  Eye Contact Fair  Facial Expression Sullen;Sad  Affect Appropriate to circumstance;Sullen  Speech Logical/coherent;Soft  Interaction Submissive  Motor Activity Fidgety  Appearance/Hygiene Unremarkable  Behavior Characteristics Cooperative;Anxious  Mood Irritable;Anxious  Thought Process  Coherency WDL  Content WDL  Delusions WDL  Perception WDL  Hallucination None reported or observed  Judgment Limited  Confusion None  Danger to Self  Current suicidal ideation? Denies  Self-Injurious Behavior No self-injurious ideation or behavior indicators observed or expressed   Agreement Not to Harm Self Yes  Description of Agreement Verbal contract  Danger to Others  Danger to Others None reported or observed

## 2020-10-01 LAB — CBC
HCT: 31.7 % — ABNORMAL LOW (ref 39.0–52.0)
Hemoglobin: 8.7 g/dL — ABNORMAL LOW (ref 13.0–17.0)
MCH: 21.7 pg — ABNORMAL LOW (ref 26.0–34.0)
MCHC: 27.4 g/dL — ABNORMAL LOW (ref 30.0–36.0)
MCV: 79.1 fL — ABNORMAL LOW (ref 80.0–100.0)
Platelets: 98 10*3/uL — ABNORMAL LOW (ref 150–400)
RBC: 4.01 MIL/uL — ABNORMAL LOW (ref 4.22–5.81)
RDW: 25.7 % — ABNORMAL HIGH (ref 11.5–15.5)
WBC: 5.9 10*3/uL (ref 4.0–10.5)
nRBC: 0 % (ref 0.0–0.2)

## 2020-10-01 LAB — BASIC METABOLIC PANEL
Anion gap: 6 (ref 5–15)
BUN: 19 mg/dL (ref 6–20)
CO2: 26 mmol/L (ref 22–32)
Calcium: 8 mg/dL — ABNORMAL LOW (ref 8.9–10.3)
Chloride: 110 mmol/L (ref 98–111)
Creatinine, Ser: 0.96 mg/dL (ref 0.61–1.24)
GFR, Estimated: >60 mL/min (ref >60)
Glucose, Bld: 81 mg/dL (ref 70–99)
Potassium: 4.3 mmol/L (ref 3.5–5.1)
Sodium: 142 mmol/L (ref 135–145)

## 2020-10-01 LAB — FERRITIN: Ferritin: 5 ng/mL — ABNORMAL LOW (ref 24–336)

## 2020-10-01 LAB — HEMOGLOBIN A1C
Hgb A1c MFr Bld: 4.9 % (ref 4.8–5.6)
Mean Plasma Glucose: 94 mg/dL

## 2020-10-01 MED ORDER — GABAPENTIN 400 MG PO CAPS
400.0000 mg | ORAL_CAPSULE | Freq: Three times a day (TID) | ORAL | Status: DC
  Administered 2020-10-01 – 2020-10-02 (×3): 400 mg via ORAL
  Filled 2020-10-01 (×9): qty 1

## 2020-10-01 MED ORDER — GABAPENTIN 300 MG PO CAPS
300.0000 mg | ORAL_CAPSULE | Freq: Three times a day (TID) | ORAL | Status: DC
  Administered 2020-10-01: 300 mg via ORAL
  Filled 2020-10-01 (×6): qty 1

## 2020-10-01 MED ORDER — IBUPROFEN 600 MG PO TABS
600.0000 mg | ORAL_TABLET | Freq: Four times a day (QID) | ORAL | Status: DC | PRN
  Administered 2020-10-01 (×2): 600 mg via ORAL
  Filled 2020-10-01 (×2): qty 1

## 2020-10-01 NOTE — Plan of Care (Signed)
  Problem: Coping: Goal: Ability to identify and develop effective coping behavior will improve Outcome: Progressing   Problem: Self-Concept: Goal: Level of anxiety will decrease Outcome: Progressing Goal: Ability to modify response to factors that promote anxiety will improve Outcome: Progressing

## 2020-10-01 NOTE — [facility] Group Notes (Signed)
[facility] Group Notes:  (Nursing/MHT/Case Management/Adjunct)  Date:  10/01/2020  Time:  12:58 PM  Type of Therapy:   Orientation/Goal Setting   Participation Level:  Active  Participation Quality:  Appropriate  Affect:  Appropriate  Cognitive:  Appropriate  Insight:  Appropriate  Engagement in Group:  Engaged  Modes of Intervention:  Orientation  Summary of Progress/Problems: Pt goal for today is pain management and focus on getting discharged.   Zachary [name] [name] 10/01/2020, 12:58 PM

## 2020-10-01 NOTE — Group Note (Signed)
Recreation Therapy Group Note   Group Topic:Communication  Group Date: 10/01/2020 Start Time: 1010 End Time: 1050 Facilitators: Caroll Rancher, LRT/CTRS Location: [address] Dayroom  Goal Area(s) Addresses:  Patient will effectively listen to complete activity.  Patient will identify communication skills used to make activity successful.  Patient will identify how skills used during activity can be used to reach post d/c goals.    Group Description: Geometric Drawings.  Three volunteers from the peer group will be shown an abstract picture with a particular arrangement of geometrical shapes.  Each round, one 'speaker' will describe the pattern, as accurately as possible without revealing the image to the group.  The remaining group members will listen and draw the picture to reflect how it is described to them. Patients with the role of 'listener' cannot ask clarifying questions but, may request that the speaker repeat a direction. Once the drawings are complete, the presenter will show the rest of the group the picture and compare how close each person came to drawing the picture. LRT will facilitate a post-activity discussion regarding effective communication and the importance of planning, listening, and asking for clarification in daily interactions with others.   Affect/Mood: N/A   Participation Level: Did not attend    Clinical Observations/Individualized Feedback: Pt did not attend group.    Plan: Continue to engage patient in RT group sessions 2-3x/week.   Caroll Rancher, LRT/CTRS 10/01/2020 12:37 PM
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
## 2020-10-01 NOTE — Progress Notes (Signed)
     10/01/20 2100  Psych Admission Type (Psych Patients Only)  Admission Status Involuntary  Psychosocial Assessment  Patient Complaints None  Eye Contact Fair  Facial Expression Sullen;Sad  Affect Appropriate to circumstance;Sullen  Speech Logical/coherent;Soft  Interaction Submissive  Motor Activity Fidgety  Appearance/Hygiene Unremarkable  Behavior Characteristics Cooperative;Anxious  Mood Depressed;Anxious  Thought Process  Coherency WDL  Content WDL  Delusions WDL  Perception WDL  Hallucination None reported or observed  Judgment WDL  Confusion WDL  Danger to Self  Current suicidal ideation? Denies  Self-Injurious Behavior No self-injurious ideation or behavior indicators observed or expressed   Agreement Not to Harm Self Yes  Description of Agreement Verbal contract  Danger to Others  Danger to Others None reported or observed

## 2020-10-01 NOTE — Progress Notes (Addendum)
[facility] MD Progress Note  I have independently evaluated the patient during a face-to-face assessment on 10/01/20. I reviewed the patient's chart, and I participated in key portions of the service.   On my assessment, patient denies SI, HI or AVH. He denies medication side-effects and voices no physical complaints. His H/H is up to 8.7/31.7 with platelets 98. Ferritin low at 5. BMP WNL except for Ca+ 8.0. I discussed option of checking B12 and folate given his anemian, and he agrees to consider additional lab draws. He continues to feel that his anemia is due to his bariatric procedure. Will continue to monitor. T3 pending.   Bartholomew Crews, MD, FAPA   10/01/2020 2:07 PM Latrelle Dodrill  MRN:  [id number]  Subjective: Fayrene Fearing reports, "My mood is very good today. I'm ready to be discharged at anytime because I'm feeling a lot better. I think coming to this hospital benefited me because I know now a free medical clinic in [place] that I could go for my medical care. I would like to change from involuntary status to voluntary because I don't feel suicidal any more & my depression is a lot better. I think the blood transfusion & starting me on iron pills helped me a lot. The only thing going on with me now is my back pain. I had a fall at home last Wednesday, I probably strained some muscles. Can I have something for it? I usually take naproxen at home. My depression today is < 1, no anxiety symptoms at all". Daily notes: Rashid is seen, chart reviewed. The chart findings discussed with the treatment team. He presents alert, oriented x 4. He is visible on the unit, attending group sessions. He presents with an improved affect & making a good eye contact. He is verbally responsive. He willingly/voluntarily participated in this assessment. Bryant says his mood is good today. He feels he is ready to be discharged to this home with girlfriend. He acknowledged that coming to this hospital benefited him because he  received blood transfusion & started on ferrous sulfate. The only complaint today is low back pain which he says is likely from a muscle strain from when he fell at his home last Wednesday. He says he normally will take naproxen Na at home for pain. He says he has been informed of a free medical clinic in [place], [place] that he could utilize for his medical care needs. Patient has problem with mobility. He is currently using wheel chair to aid his mobility. This is not a new problem for him. He says he uses walker & cane as well at his home. He agrees to Ibuprofen 600 mg po qid prn for pain. His gabapentin in increased from 300 mg to 400 mg tid for pain. He denies any symptoms of depression or anxiety today. He denies any SIHI, AVH, delusional thoughts or paranoia. He does not appear to be responding ding to any internal stimuli. He rates his depression less than #1 & anxiety #0. Grier is in agreement to continue current plan of care as already in progress.    Objective: 50 year old Caucasian male with hx of mental health issues (chronic), multiple suicidal thoughts/attempts (chronic), borderline personality disorder, PTSD & other chronic medical issues. He was a patient in this [facility] in 2014, 2015. 2016 & lastly in 2018 with similar complaints & related issues. In each of these previous admissions, he was treated, stabilized & discharged with outpatient psychiatric services referral & appointments. Carl is being  admitted to the [facility] this time around with complaint of suicide attempt by self-inflicted cuts to his writ/arm areas. He was apparently taken to the ED for evaluation & treatments. And while at the ED, he was noted to be highly anemic, received 4 pints of blood, medically stabilized/cleared & transferred to the [facility] for mood stabilization treatments.  Principal Problem: Bipolar I disorder, most recent episode depressed (HCC)  Diagnosis: Principal Problem:   Bipolar I disorder, most recent episode  depressed (HCC) Active Problems:   Posttraumatic stress disorder   Morbid obesity (HCC)   Benign essential HTN   Suicidal ideation   Chronic pain syndrome   Iron deficiency anemia  Total Time spent with patient:  25 minutes  Past Psychiatric History: See H&P  Past Medical History:  Past Medical History:  Diagnosis Date   Anxiety    Arthritis    Bipolar 1 disorder, depressed (HCC)    Fibromyalgia    Hx of degenerative disc disease    Obesity    PTSD (post-traumatic stress disorder)     Past Surgical History:  Procedure Laterality Date   ADENOIDECTOMY     APPENDECTOMY     CHOLECYSTECTOMY     GASTRIC BYPASS     TYMPANOSTOMY TUBE PLACEMENT     Family History:  Family History  Problem Relation Age of Onset   Diabetes Father    Cancer Father    Family Psychiatric  History: See H&P.  Social History:  Social History   Substance and Sexual Activity  Alcohol Use No   Comment: none     Social History   Substance and Sexual Activity  Drug Use No    Social History   Socioeconomic History   Marital status: Single    Spouse name: Not on file   Number of children: Not on file   Years of education: Not on file   Highest education level: Not on file  Occupational History   Not on file  Tobacco Use   Smoking status: Never   Smokeless tobacco: Never  Substance and Sexual Activity   Alcohol use: No    Comment: none   Drug use: No   Sexual activity: Yes    Birth control/protection: Condom  Other Topics Concern   Not on file  Social History Narrative   Not on file   Social Determinants of Health   Financial Resource Strain: Not on file  Food Insecurity: Not on file  Transportation Needs: Not on file  Physical Activity: Not on file  Stress: Not on file  Social Connections: Not on file   Additional Social History:   Sleep: Fair  Appetite:  Fair  Current Medications: Current Facility-Administered Medications  Medication Dose Route Frequency Provider  Last Rate Last Admin   acetaminophen (TYLENOL) tablet 650 mg  650 mg Oral Q6H PRN Novella Olive, NP   650 mg at 10/01/20 0928   alum & mag hydroxide-simeth (MAALOX/MYLANTA) 200-200-20 MG/5ML suspension 30 mL  30 mL Oral Q4H PRN Novella Olive, NP       calcium-vitamin D (OSCAL WITH D) 500-200 MG-UNIT per tablet 2 tablet  2 tablet Oral BID Jaclyn Shaggy, PA-C   2 tablet at 10/01/20 0928   clonazePAM (KLONOPIN) disintegrating tablet 1 mg  1 mg Oral QHS Nwoko, Agnes I, NP   1 mg at 09/30/20 2119   feeding supplement (ENSURE ENLIVE / ENSURE PLUS) liquid 237 mL  237 mL Oral BID BM Comer Locket, MD  237 mL at 09/30/20 1510   ferrous sulfate tablet 325 mg  325 mg Oral BID WC Mariel Craft, MD   325 mg at 10/01/20 8182   gabapentin (NEURONTIN) capsule 400 mg  400 mg Oral TID Armandina Stammer I, NP       hydrOXYzine (ATARAX/VISTARIL) tablet 25 mg  25 mg Oral TID PRN Novella Olive, NP       ibuprofen (ADVIL) tablet 600 mg  600 mg Oral Q6H PRN Armandina Stammer I, NP       loperamide (IMODIUM) capsule 4 mg  4 mg Oral Q6H PRN Comer Locket, MD   4 mg at 09/30/20 1707   OLANZapine zydis (ZYPREXA) disintegrating tablet 5 mg  5 mg Oral Q8H PRN Novella Olive, NP       And   LORazepam (ATIVAN) tablet 1 mg  1 mg Oral PRN Novella Olive, NP       And   ziprasidone (GEODON) injection 20 mg  20 mg Intramuscular PRN Novella Olive, NP       magnesium hydroxide (MILK OF MAGNESIA) suspension 30 mL  30 mL Oral Daily PRN Novella Olive, NP       mirtazapine (REMERON SOL-TAB) disintegrating tablet 15 mg  15 mg Oral QHS Armandina Stammer I, NP       multivitamin with minerals tablet 1 tablet  1 tablet Oral Daily Novella Olive, NP   1 tablet at 10/01/20 0928   OLANZapine zydis (ZYPREXA) disintegrating tablet 5 mg  5 mg Oral QHS Armandina Stammer I, NP   5 mg at 09/30/20 2119   ondansetron (ZOFRAN) tablet 4 mg  4 mg Oral Q8H PRN Novella Olive, NP   4 mg at 10/01/20 0929   pantoprazole (PROTONIX) EC tablet 40 mg  40 mg Oral BID  AC Melbourne Abts W, PA-C   40 mg at 10/01/20 9937   thiamine tablet 100 mg  100 mg Oral Daily Novella Olive, NP   100 mg at 10/01/20 1696   traZODone (DESYREL) tablet 50 mg  50 mg Oral QHS PRN Novella Olive, NP       Lab Results:  Results for orders placed or performed during the hospital encounter of 09/29/20 (from the past 48 hour(s))  CBC     Status: Abnormal   Collection Time: 09/30/20  6:26 AM  Result Value Ref Range   WBC 4.5 4.0 - 10.5 K/uL   RBC 3.79 (L) 4.22 - 5.81 MIL/uL   Hemoglobin 8.1 (L) 13.0 - 17.0 g/dL   HCT 78.9 (L) 38.1 - 01.7 %   MCV 79.7 (L) 80.0 - 100.0 fL   MCH 21.4 (L) 26.0 - 34.0 pg   MCHC 26.8 (L) 30.0 - 36.0 g/dL   RDW 51.0 (H) 25.8 - 52.7 %   Platelets 104 (L) 150 - 400 K/uL    Comment: SPECIMEN CHECKED FOR CLOTS Immature Platelet Fraction may be clinically indicated, consider ordering this additional test [id number] REPEATED TO VERIFY PLATELET COUNT CONFIRMED BY SMEAR    nRBC 0.0 0.0 - 0.2 %    Comment: Performed at [facility], [address] [place], [place] [address]  Hemoglobin A1c     Status: None   Collection Time: 09/30/20  6:26 AM  Result Value Ref Range   Hgb A1c MFr Bld 4.9 4.8 - 5.6 %    Comment: (NOTE)         Prediabetes: 5.7 - 6.4  Diabetes: >6.4         Glycemic control for adults with diabetes: <7.0    Mean Plasma Glucose 94 mg/dL    Comment: (NOTE) Performed At: [facility] [address] [place], [place] [id number] Jolene Schimke MD [id number]   Lipid panel     Status: Abnormal   Collection Time: 09/30/20  6:26 AM  Result Value Ref Range   Cholesterol 83 0 - 200 mg/dL   Triglycerides 24 <676 mg/dL   HDL 40 (L) >72 mg/dL   Total CHOL/HDL Ratio 2.1 RATIO   VLDL 5 0 - 40 mg/dL   LDL Cholesterol 38 0 - 99 mg/dL    Comment:        Total Cholesterol/HDL:CHD Risk Coronary Heart Disease Risk Table                     Men   Women  1/2 Average Risk   3.4   3.3  Average Risk       5.0    4.4  2 X Average Risk   9.6   7.1  3 X Average Risk  23.4   11.0        Use the calculated Patient Ratio above and the CHD Risk Table to determine the patient's CHD Risk.        ATP III CLASSIFICATION (LDL):  <100     mg/dL   Optimal  094-709  mg/dL   Near or Above                    Optimal  130-159  mg/dL   Borderline  628-366  mg/dL   High  >294     mg/dL   Very High Performed at [facility], [address], [place], [place] [address]   Ferritin     Status: Abnormal   Collection Time: 09/30/20  6:26 AM  Result Value Ref Range   Ferritin 4 (L) 24 - 336 ng/mL    Comment: Performed at [facility], [address], [place], [place] [address]  TSH     Status: None   Collection Time: 09/30/20  6:19 PM  Result Value Ref Range   TSH 3.691 0.350 - 4.500 uIU/mL    Comment: Performed by a 3rd Generation assay with a functional sensitivity of <=0.01 uIU/mL. Performed at [facility], [address] [place], [place] [address]   T4, free     Status: None   Collection Time: 09/30/20  6:19 PM  Result Value Ref Range   Free T4 0.90 0.61 - 1.12 ng/dL    Comment: (NOTE) Biotin ingestion may interfere with free T4 tests. If the results are inconsistent with the TSH level, previous test results, or the clinical presentation, then consider biotin interference. If needed, order repeat testing after stopping biotin. Performed at [facility], [address] [place], [place] [address]   CBC     Status: Abnormal   Collection Time: 10/01/20  6:15 AM  Result Value Ref Range   WBC 5.9 4.0 - 10.5 K/uL   RBC 4.01 (L) 4.22 - 5.81 MIL/uL   Hemoglobin 8.7 (L) 13.0 - 17.0 g/dL   HCT 00.1 (L) 74.9 - 44.9 %   MCV 79.1 (L) 80.0 - 100.0 fL   MCH 21.7 (L) 26.0 - 34.0 pg   MCHC 27.4 (L) 30.0 - 36.0 g/dL   RDW 67.5 (H) 91.6 - 38.4 %  Platelets 98 (L) 150 - 400 K/uL    Comment: Immature Platelet Fraction may be clinically indicated,  consider ordering this additional test [id number] CONSISTENT WITH PREVIOUS RESULT    nRBC 0.0 0.0 - 0.2 %    Comment: Performed at [facility], [address] [place], [place] [address]  Ferritin     Status: Abnormal   Collection Time: 10/01/20  6:15 AM  Result Value Ref Range   Ferritin 5 (L) 24 - 336 ng/mL    Comment: Performed at [facility], [address], [place], [place] [address]  Basic metabolic panel     Status: Abnormal   Collection Time: 10/01/20  6:15 AM  Result Value Ref Range   Sodium 142 135 - 145 mmol/L   Potassium 4.3 3.5 - 5.1 mmol/L   Chloride 110 98 - 111 mmol/L   CO2 26 22 - 32 mmol/L   Glucose, Bld 81 70 - 99 mg/dL    Comment: Glucose reference range applies only to samples taken after fasting for at least 8 hours.   BUN 19 6 - 20 mg/dL   Creatinine, Ser 5.18 0.61 - 1.24 mg/dL   Calcium 8.0 (L) 8.9 - 10.3 mg/dL   GFR, Estimated >84 >16 mL/min    Comment: (NOTE) Calculated using the CKD-EPI Creatinine Equation (2021)    Anion gap 6 5 - 15    Comment: Performed at [facility], [address], [place], [place] [address]   Blood Alcohol level:  Lab Results  Component Value Date   ETH <10 09/25/2020   ETH <10 11/04/2016   Metabolic Disorder Labs: Lab Results  Component Value Date   HGBA1C 4.9 09/30/2020   MPG 94 09/30/2020   No results found for: PROLACTIN Lab Results  Component Value Date   CHOL 83 09/30/2020   TRIG 24 09/30/2020   HDL 40 (L) 09/30/2020   CHOLHDL 2.1 09/30/2020   VLDL 5 09/30/2020   LDLCALC 38 09/30/2020   Physical Findings: AIMS:  , ,  ,  ,    CIWA:    COWS:     Musculoskeletal: Strength & Muscle Tone: within normal limits Gait & Station: unsteady, Use w/c to aid his mobility. Says he uses walker/cane at home as well. Patient leans: N/A  Psychiatric Specialty Exam:  Presentation  General Appearance: Appropriate for Environment; Casual  Eye  Contact:Good  Speech:Clear and Coherent; Normal Rate  Speech Volume:Normal  Handedness:Right  Mood and Affect  Mood:Euthymic  Affect:Appropriate; Congruent  Thought Process  Thought Processes:Coherent; Goal Directed; Linear  Descriptions of Associations:Intact  Orientation:Full (Time, Place and Person)  Thought Content:Logical  History of Schizophrenia/Schizoaffective disorder:No  Duration of Psychotic Symptoms:No data recorded Hallucinations:Hallucinations: None  Ideas of Reference:None  Suicidal Thoughts:Suicidal Thoughts: No  Homicidal Thoughts:Homicidal Thoughts: No  Sensorium  Memory:Immediate Good; Recent Good; Remote Good  Judgment:Good  Insight:Good  Executive Functions  Concentration:Good  Attention Span:Good  Recall:Good  Fund of Knowledge:Good  Language:Good  Psychomotor Activity  Psychomotor Activity:Psychomotor Activity: Normal  Assets  Assets:Communication Skills; Desire for Improvement; Housing; Social Support; Resilience  Sleep  Sleep:Sleep: Fair Number of Hours of Sleep: 4.5  Physical Exam: Physical Exam Vitals and nursing note reviewed.  HENT:     Nose: Nose normal.  Eyes:     Pupils: Pupils are equal, round, and reactive to light.  Cardiovascular:     Rate and Rhythm: Normal rate.     Pulses: Normal pulses.  Pulmonary:  Effort: Pulmonary effort is normal.  Genitourinary:    Comments: Deferred Musculoskeletal:     Cervical back: Normal range of motion.     Comments: Hx. Generalized weakness to lower extremities. Uses w/c, walker & cane to aid his mobility.  Skin:    General: Skin is warm and dry.  Neurological:     General: No focal deficit present.     Mental Status: He is alert and oriented to person, place, and time. Mental status is at baseline.   Review of Systems  Constitutional:  Negative for chills, diaphoresis and fever.  HENT:  Negative for congestion and sore throat.   Eyes:  Negative for blurred  vision.  Respiratory:  Negative for cough, shortness of breath and wheezing.   Cardiovascular:  Negative for chest pain and palpitations.  Gastrointestinal:  Negative for abdominal pain, constipation, diarrhea, heartburn, nausea and vomiting.  Genitourinary:  Positive for frequency.  Musculoskeletal:  Positive for back pain and myalgias.  Skin:        Healing old scars to arm area form self-inflicted lacerations.  Neurological:  Negative for dizziness, tingling, tremors, sensory change, speech change, focal weakness, seizures, weakness and headaches.  Endo/Heme/Allergies:  Negative for environmental allergies. Does not bruise/bleed easily.       Allergies: Capsaicin, codeine, latex  Psychiatric/Behavioral:  Negative for depression (Rates depression < 1), hallucinations, memory loss, substance abuse and suicidal ideas. The patient is not nervous/anxious and does not have insomnia.   Blood pressure 129/81, pulse 74, temperature 99 F (37.2 C), temperature source Oral, resp. rate 18, height 5\' 11"  (1.803 m), weight 124.7 kg, SpO2 100 %. Body mass index is 38.35 kg/m.  Treatment Plan Summary: Daily contact with patient to assess and evaluate symptoms and progress in treatment and Medication management.   Continue inpatient hospitalization.  Will continue today 10/01/2020 plan as below except where it is noted.   Mood control. Continue Zyprexa zydis -ODT 5 mg po Q hs.  Depression. Continue Mirtazapine-ODT 15 mg po Q hs.  Anxiety.  Continue Clonazepam-ODT 1 mg po at bedtime for anxiety/insomnia Continue Vistaril 25 mg po tid prn. Increased gabapentin 300 mg po tid.  Insomnia. Continue Trazodone 50 mg po Q hs prn. Continue Mirtazapine-ODT 15 mg po Q hs.   Agitation/psychosis protocols. Continue Olanzapine Zydis-odt 5 mg po Q 8 hrs prn & Lorazepam 1 mg po prn x 1 dose. & ziprasidone injectable 20 mg IM prn x 1 dose.   Other medication issues: continue;  Ferrous sulfate 325 mg po  daily for anemia. Oscal with D 2 tablets po for bone health. Thiamine 100 mg po daily for thiamine replacement.  Protonix 40 mg po Q arm for GERD. Continue gabapentin 300 mg po tid for neuropathic pain.  Initiated: Ibuprofen 600 mg po qid prn for lower back pain.   Other prns: Continue; Acetaminophen 650 mg po po Q 6hrs prn for fever/pain. Mylanta 30 ml po Q 4 hrs prn for indigestion. Imodium 4 mg po Q 6 hrs prn for diarrhea.  MOM 30 ml po Q daily for constipation.  Zofran 4 mg po Q 8 hrs prn for n/v.  10/03/2020, NP, pmhnp, fnp-bc 10/01/2020, 2:07 PM

## 2020-10-01 NOTE — [facility] Suicide Risk Assessment (Signed)
[facility] INPATIENT:  Family/Significant Other Suicide Prevention Education  Suicide Prevention Education:  Education Completed; girlfriend, Horatio Pel [phone]), has been identified by the patient as the family member/significant other with whom the patient will be residing, and identified as the person(s) who will aid the patient in the event of a mental health crisis (suicidal ideations/suicide attempt).  With written consent from the patient, the family member/significant other has been provided the following suicide prevention education, prior to the and/or following the discharge of the patient.   CSW spoke with patients significant other who states that this pt had a fall on 9/17 which caused his pain level to increase and become unbearable. She states his iron level has also been low which she reports has impacted his depression.   Pt's girlfriend reports that she assists this pt with administering his medications. She reports that she is able to pick this pt up at discharge and he is able to return home.   The suicide prevention education provided includes the following: Suicide risk factors Suicide prevention and interventions National Suicide Hotline telephone number [facility] assessment telephone number [facility] Emergency Assistance 911 [facility] and/or Residential Mobile Crisis Unit telephone number  Request made of family/significant other to: Remove weapons (e.g., guns, rifles, knives), all items previously/currently identified as safety concern.   Remove drugs/medications (over-the-counter, prescriptions, illicit drugs), all items previously/currently identified as a safety concern.  The family member/significant other verbalizes understanding of the suicide prevention education information provided.  The family member/significant other agrees to remove the items of safety concern listed above.  Kirah Stice A Ryah Cribb 10/01/2020, 11:12 AM

## 2020-10-01 NOTE — Progress Notes (Signed)
Pt denies SI/HI/AVH.  Pt reported that he feels he is ready for discharge and that he is doing well emotionally.  Pt endorses back lower pain on left side.  Pt says he is getting some pain relief from ibuprofen, gabapentin and hot packs to lower back.  Pt's thought processes are logical and pt makes his needs known.  RN provided support and administered meds per provider orders.  Q 15 min safety checks remain in place.

## 2020-10-02 DIAGNOSIS — F3130 Bipolar disorder, current episode depressed, mild or moderate severity, unspecified: Principal | ICD-10-CM

## 2020-10-02 LAB — FERRITIN: Ferritin: 15 ng/mL — ABNORMAL LOW (ref 24–336)

## 2020-10-02 LAB — T3, FREE: T3, Free: 1.9 pg/mL — ABNORMAL LOW (ref 2.0–4.4)

## 2020-10-02 MED ORDER — HYDROXYZINE HCL 25 MG PO TABS: 25.0000 mg | ORAL_TABLET | Freq: Three times a day (TID) | ORAL | 0 refills | Status: AC | PRN

## 2020-10-02 MED ORDER — FERROUS SULFATE 325 (65 FE) MG PO TABS: 325.0000 mg | ORAL_TABLET | Freq: Two times a day (BID) | ORAL | Status: AC

## 2020-10-02 MED ORDER — CALCIUM CARBONATE-VITAMIN D 500-200 MG-UNIT PO TABS: 2.0000 | ORAL_TABLET | Freq: Two times a day (BID) | ORAL | Status: AC

## 2020-10-02 MED ORDER — GABAPENTIN 400 MG PO CAPS: 400.0000 mg | ORAL_CAPSULE | Freq: Three times a day (TID) | ORAL | 0 refills | Status: AC

## 2020-10-02 MED ORDER — ACETAMINOPHEN 325 MG PO TABS: 650.0000 mg | ORAL_TABLET | Freq: Four times a day (QID) | ORAL | Status: AC | PRN

## 2020-10-02 MED ORDER — ENSURE ENLIVE PO LIQD: 237.0000 mL | Freq: Two times a day (BID) | ORAL | 12 refills | Status: AC

## 2020-10-02 MED ORDER — OLANZAPINE 5 MG PO TBDP: 5.0000 mg | ORAL_TABLET | Freq: Every day | ORAL | 0 refills | Status: AC

## 2020-10-02 MED ORDER — MIRTAZAPINE 15 MG PO TBDP: 15.0000 mg | ORAL_TABLET | Freq: Every day | ORAL | 0 refills | Status: AC

## 2020-10-02 MED ORDER — ADULT MULTIVITAMIN W/MINERALS CH: 1.0000 | ORAL_TABLET | Freq: Every day | ORAL | Status: AC
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
## 2020-10-02 NOTE — Progress Notes (Signed)
Recreation Therapy Notes  INPATIENT RECREATION TR PLAN  Patient Details Name: Zachary [name] MRN: [id number] DOB: March 15, 1970 Today's Date: 10/02/2020  Rec Therapy Plan Is patient appropriate for Therapeutic Recreation?: Yes Treatment times per week: about 3 days Estimated Length of Stay: 5-7 days TR Treatment/Interventions: Group participation ([name])  Discharge Criteria Pt will be discharged from therapy if:: Discharged Treatment plan/goals/alternatives discussed and agreed upon by:: Patient/family  Discharge Summary Short term goals set: See patient care plan Reason goals not met: Pt did not attend group sessions. Therapeutic equipment acquired: N/[name] Reason patient discharged from therapy: Discharge from hospital Pt/family agrees with progress & goals achieved: Yes Date patient discharged from therapy: 10/02/20   Victorino Sparrow, LRT/CTRS Zachary [name], Zachary [name] 10/02/2020, 1:54 PM

## 2020-10-02 NOTE — [facility] Group Notes (Signed)
Adult Psychoeducational Group Note  Date:  10/02/2020 Time:  12:47 AM  Group Topic/Focus:  Making Healthy Choices:   The focus of this group is to help patients identify negative/unhealthy choices they were using prior to admission and identify positive/healthier coping strategies to replace them upon discharge.  Participation Level:  Active  Participation Quality:  Appropriate and Attentive  Affect:  Appropriate  Cognitive:  Alert  Insight: Good  Engagement in Group:  Engaged  Modes of Intervention:  Discussion  Additional Comments  Jacalyn Lefevre 10/02/2020, 12:47 AM

## 2020-10-02 NOTE — Group Note (Signed)
Recreation Therapy Group Note   Group Topic:Team Building  Group Date: 10/02/2020 Start Time: 1010 End Time: 1050 Facilitators: Caroll Rancher, LRT/CTRS Location: [address] Dayroom  Goal Area(s) Addresses:  Patient will effectively work with peer towards shared goal.  Patient will identify skills used to make activity successful.  Patient will identify how skills used during activity can be applied to reach post d/c goals.   Group Description:Tallest [employer]. In teams of 5-6, patients were given 25 small craft pipe cleaners. Using the materials provided, patients were instructed to compete againt the opposing team(s) to build the tallest free-standing structure from floor level. The activity was timed; difficulty increased by Clinical research associate as Production designer, theatre/television/film continued.  Systematically resources were removed with additional directions for example, placing one arm behind their back, working in silence, and shape stipulations. LRT facilitated post-activity discussion reviewing team processes and necessary communication skills involved in completion. Patients were encouraged to reflect how the skills utilized, or not utilized, in this activity can be incorporated to positively impact support systems post discharge.  Affect/Mood: N/A   Participation Level: Did not attend    Clinical Observations/Individualized Feedback: Pt did not attend group.    Plan: Continue to engage patient in RT group sessions 2-3x/week.  Caroll Rancher, LRT/CTRS 10/02/2020 1:26 PM

## 2020-10-02 NOTE — Plan of Care (Signed)
Pt did not attend recreation therapy group sessions.   Ahava Kissoon, LRT/CTRS 

## 2020-10-02 NOTE — Progress Notes (Signed)
Patient ID: Zachary [name], male   DOB: 04-Jun-1970, 50 y.o.   MRN: [id number] RN met with pt and reviewed pt's discharge instructions. Pt verbalized understanding of discharge instructions and pt did not have any questions. RN reviewed and provided pt with a copy of SRA, AVS and Transition Record. RN returned pt's belongings to pt. Prescriptions were given to pt. Pt denied SI/HI/AVH and voiced no concerns.  Patient was excited to see his girlfriend. He was singing in the hallway on the way to the lobby. Patient discharged to the lobby to meet girlfriend, whom is taking him home, without incident. Pt was appreciative of the care pt received at [facility].
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
## 2020-10-02 NOTE — Progress Notes (Signed)
  [facility] Adult Case Management Discharge Plan :  Will you be returning to the same living situation after discharge:  Yes,  home At discharge, do you have transportation home?: Yes,  girlfriend to pick this pt up  Do you have the ability to pay for your medications: No. Samples to be provided at discharge  Release of information consent forms completed and in the chart;  Patient's signature needed at discharge.  Patient to Follow up at:  Follow-up Information     [facility] Follow up on 10/14/2020.   Why: You have an appointment for therapy services on Appt is on 10/14/20 at 4:00 pm. Contact information: [address], [place] [address]  Phone: [phone]        [name], [place] Follow up on 10/12/2020.   Why: You have an appointment on 10/12/20 at 4:00 pm for medication management services. Contact information: [address], [place] [address]  Phone: [phone]                Next level of care provider has access to [facility] Link:no  Safety Planning and Suicide Prevention discussed: Yes,  with girlfriend     Has patient been referred to the Quitline?: N/A patient is not a smoker  Patient has been referred for addiction treatment: N/A  Otelia Santee, LCSW 10/02/2020, 10:07 AM

## 2023-01-10 IMAGING — CT CT ABD-PELV W/ CM
2 of 5 series · 16 of 46 positions shown, 18 images · IV contrast (omnipaque)
Comparison: None.

CLINICAL DATA: Abdominal pain, acute, nonlocalized unintended
weight loss. Unintentional weight loss. Left inguinal mass

EXAM:
CT ABDOMEN AND PELVIS WITH CONTRAST
TECHNIQUE: Multidetector CT imaging of the abdomen and pelvis was performed
using the standard protocol following bolus administration of
intravenous contrast.
CONTRAST:  80mL OMNIPAQUE IOHEXOL 350 MG/ML SOLN

[Series 2: axial st · axial · 0.98mm/px · z∈[-529,-94]mm · 13 of 101 slices shown, 15 images]
[im 7/101  soft-tissue]
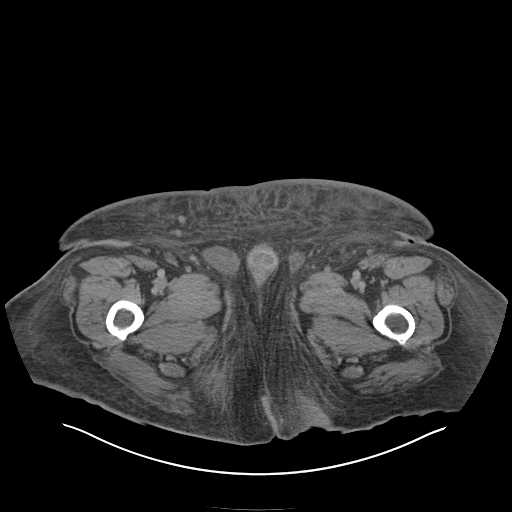
[im 7/101  bone]
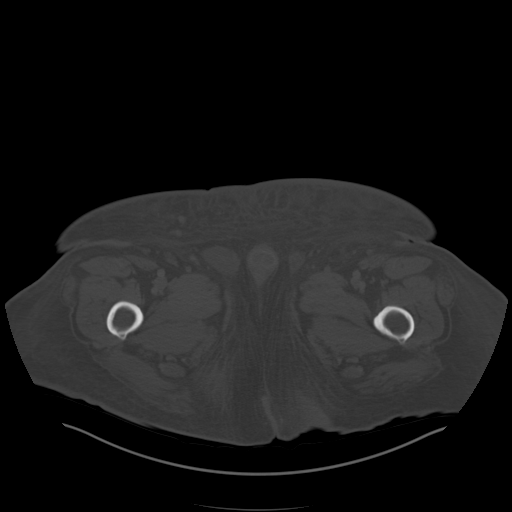
[im 13/101  soft-tissue]
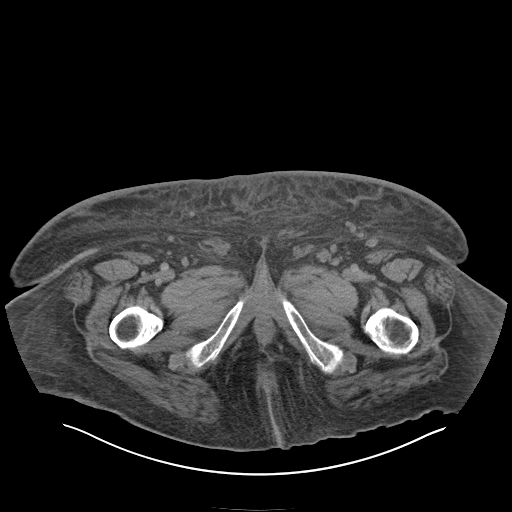
[im 19/101  soft-tissue]
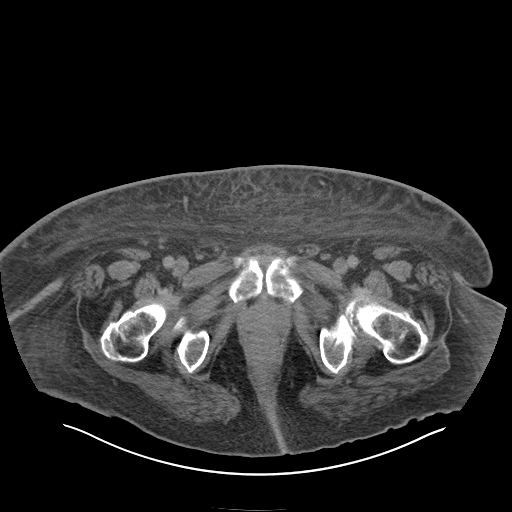
[im 32/101  soft-tissue]
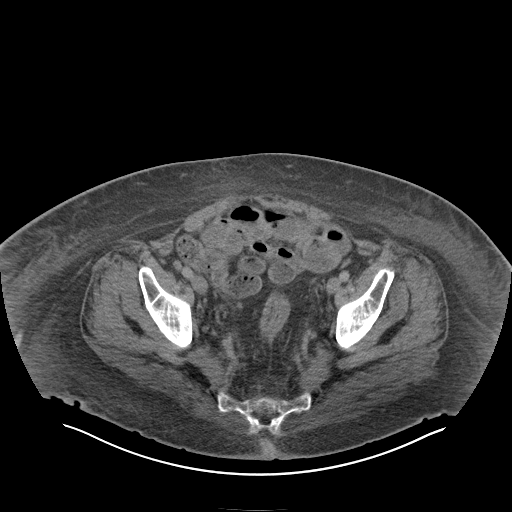
[im 38/101  soft-tissue]
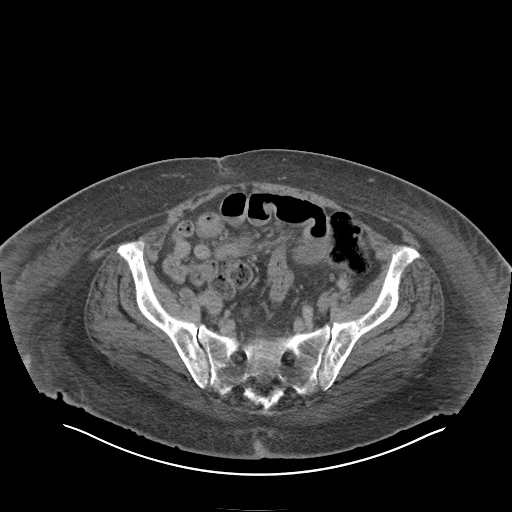
[im 44/101  soft-tissue]
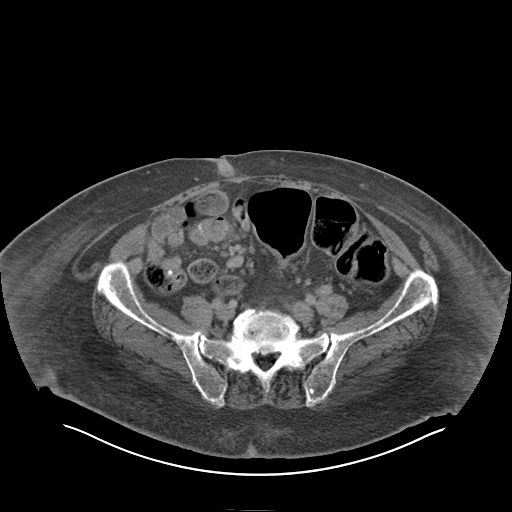
[im 51/101  soft-tissue]
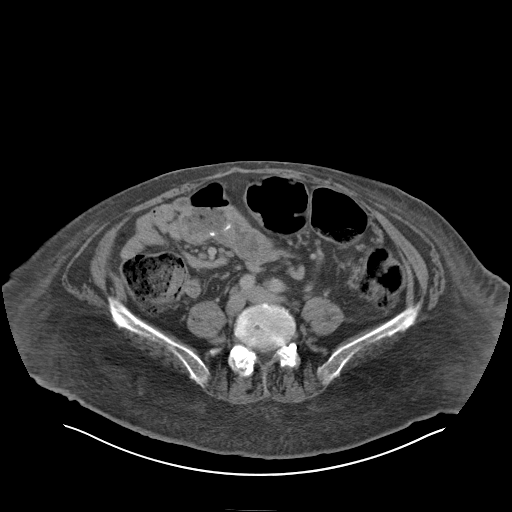
[im 57/101  soft-tissue]
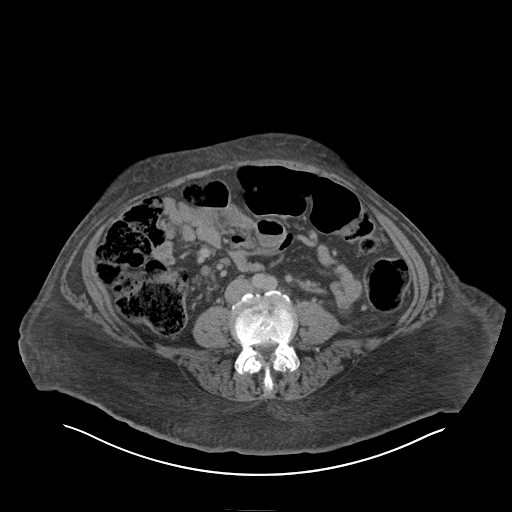
[im 63/101  soft-tissue]
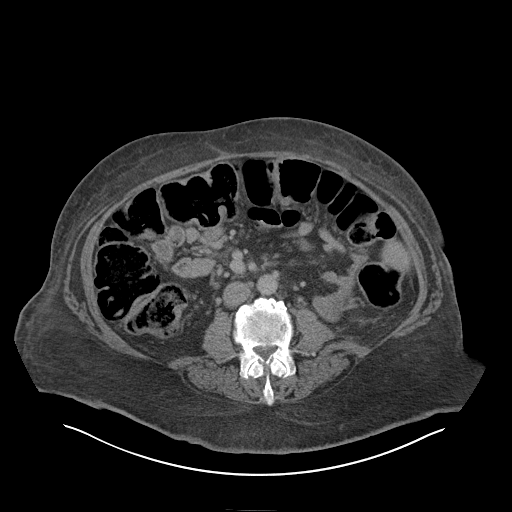
[im 63/101  bone]
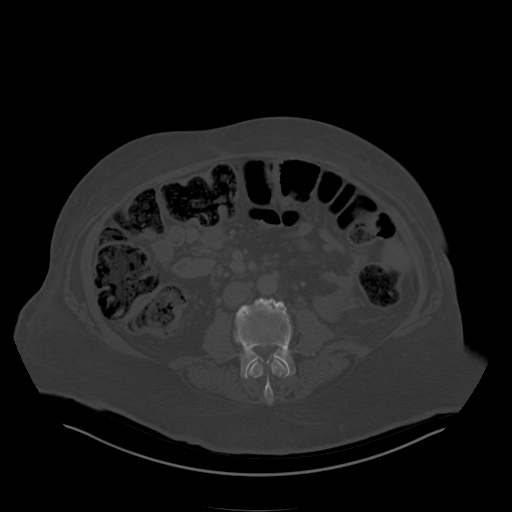
[im 69/101  soft-tissue]
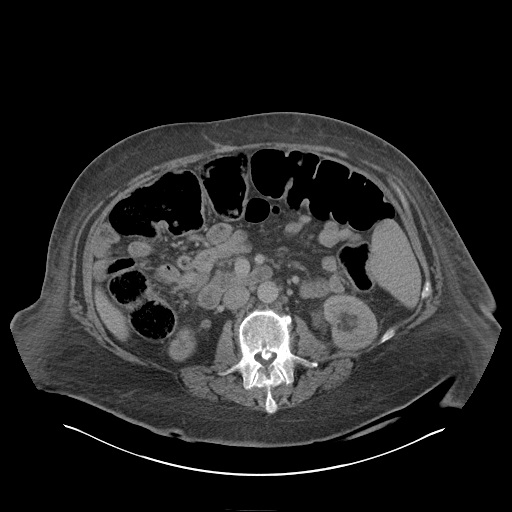
[im 82/101  soft-tissue]
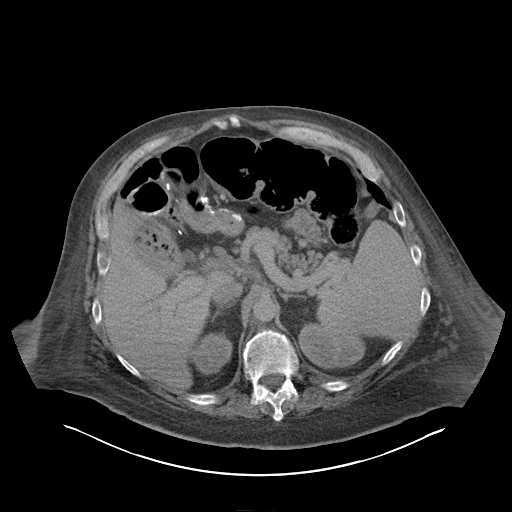
[im 88/101  soft-tissue]
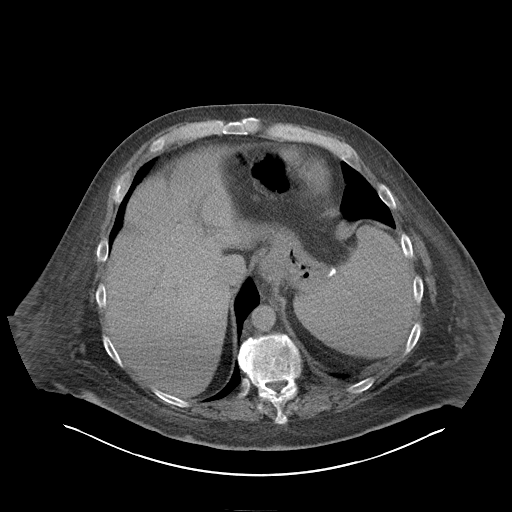
[im 94/101  soft-tissue]
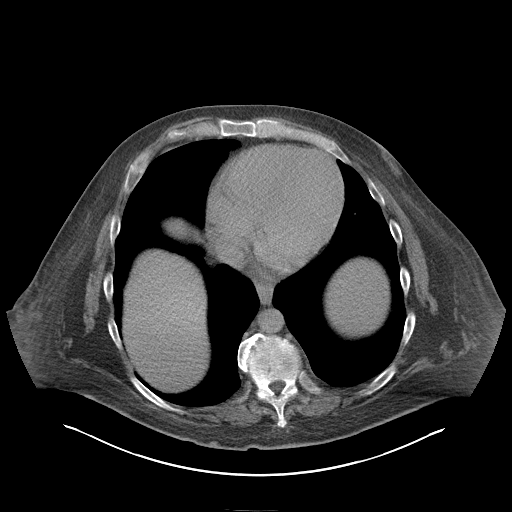

[Series 5: coronal st · coronal · 1.01mm/px · 3 of 193 slices shown]
[im 65/193  soft-tissue]
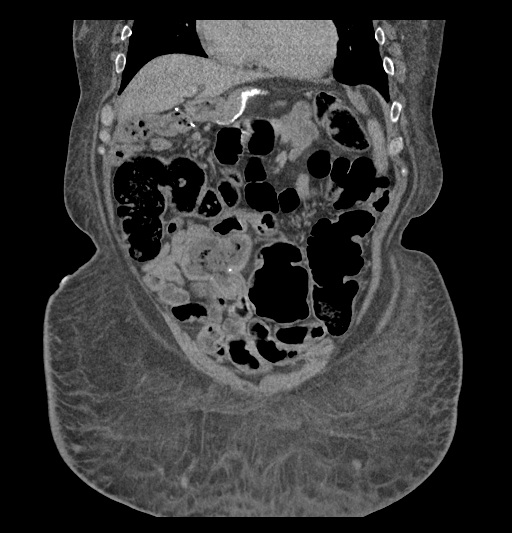
[im 86/193  soft-tissue]
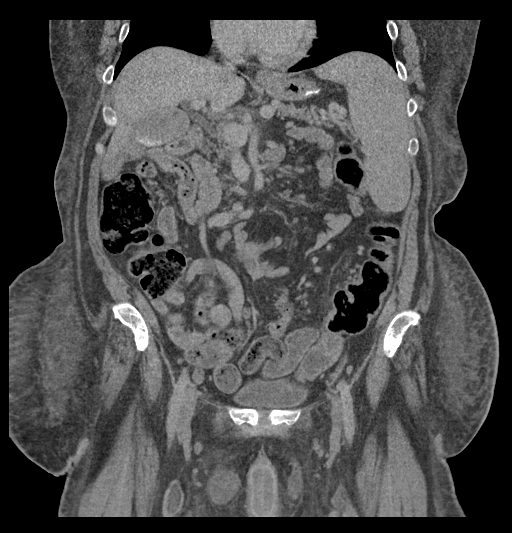
[im 107/193  soft-tissue]
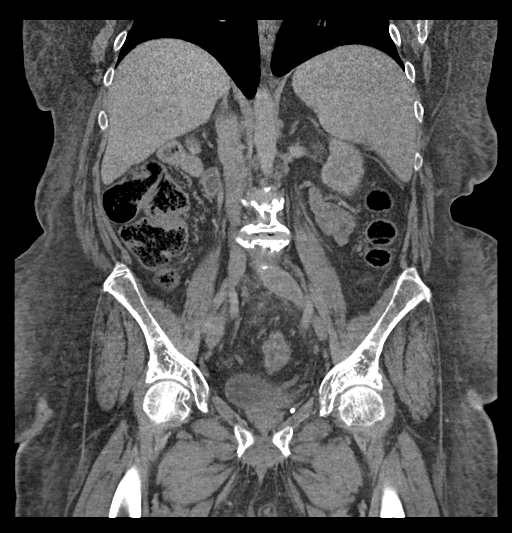

[16 of 46 positions shown; findings below may reference images not displayed]

FINDINGS: Lower chest: No acute abnormality.

Hepatobiliary: Cholelithiasis noted without pericholecystic
inflammatory change. No intra or extrahepatic biliary ductal
dilation. Liver unremarkable.

Pancreas: Unremarkable

Spleen: The spleen is moderately enlarged measuring 16.8 cm in
greatest dimension. No intrasplenic lesions are seen.

Adrenals/Urinary Tract: The adrenal glands are unremarkable. The
kidneys are normal in size and position. Bilateral parapelvic renal
cysts are noted. There is mild asymmetric right renal cortical
atrophy. No hydronephrosis. No intrarenal or ureteral calculi. No
enhancing intra cortical masses. The bladder is unremarkable.

Stomach/Bowel: Surgical changes of gastric sleeve resection,
appendectomy, and partial small-bowel resection are identified. No
evidence of obstruction or focal inflammation. No free
intraperitoneal gas or fluid.

Vascular/Lymphatic: No pathologic adenopathy within the abdomen and
pelvis. The abdominal vasculature is unremarkable.

Reproductive: Prostate is unremarkable.

Other: There is diffuse body wall subcutaneous edema. Tiny
broad-based fat containing umbilical hernia. The rectum is
unremarkable.

Musculoskeletal: No acute bone abnormality. No lytic or blastic bone
lesion. Degenerative changes seen within the lumbar spine.
IMPRESSION: No acute intra-abdominal pathology identified.

Cholelithiasis.

Diffuse body wall subcutaneous edema.

No abnormal subcutaneous soft tissue mass, lymphadenopathy, or fluid
collection identified to account for the patient's reported left
inguinal palpable abnormality.

Moderate splenomegaly
